# Patient Record
Sex: Female | Born: 1965
Health system: Southern US, Community
[De-identification: ages and names within clinical notes are randomized; demographics above are authoritative.]

## PROBLEM LIST (undated history)

## (undated) DIAGNOSIS — K219 Gastro-esophageal reflux disease without esophagitis: Secondary | ICD-10-CM

## (undated) DIAGNOSIS — W19XXXA Unspecified fall, initial encounter: Secondary | ICD-10-CM

## (undated) DIAGNOSIS — I10 Essential (primary) hypertension: Secondary | ICD-10-CM

## (undated) DIAGNOSIS — Y92009 Unspecified place in unspecified non-institutional (private) residence as the place of occurrence of the external cause: Secondary | ICD-10-CM

---

## 2000-03-03 ENCOUNTER — Encounter: Payer: Self-pay | Admitting: Urology

## 2000-03-03 ENCOUNTER — Encounter: Admission: RE | Admit: 2000-03-03 | Discharge: 2000-03-03 | Payer: Self-pay | Admitting: Urology

## 2001-01-03 ENCOUNTER — Encounter: Payer: Self-pay | Admitting: Family Medicine

## 2001-01-03 ENCOUNTER — Encounter: Admission: RE | Admit: 2001-01-03 | Discharge: 2001-01-03 | Payer: Self-pay | Admitting: Family Medicine

## 2001-12-20 ENCOUNTER — Other Ambulatory Visit: Admission: RE | Admit: 2001-12-20 | Discharge: 2001-12-20 | Payer: Self-pay | Admitting: Family Medicine

## 2003-02-26 ENCOUNTER — Encounter: Payer: Self-pay | Admitting: Family Medicine

## 2003-02-26 ENCOUNTER — Encounter: Admission: RE | Admit: 2003-02-26 | Discharge: 2003-02-26 | Payer: Self-pay | Admitting: Family Medicine

## 2004-03-13 ENCOUNTER — Encounter: Admission: RE | Admit: 2004-03-13 | Discharge: 2004-03-13 | Payer: Self-pay | Admitting: Family Medicine

## 2004-03-20 ENCOUNTER — Other Ambulatory Visit: Admission: RE | Admit: 2004-03-20 | Discharge: 2004-03-30 | Payer: Self-pay | Admitting: Family Medicine

## 2004-09-24 ENCOUNTER — Emergency Department (HOSPITAL_COMMUNITY): Admission: EM | Admit: 2004-09-24 | Discharge: 2004-09-24 | Payer: Self-pay | Admitting: Emergency Medicine

## 2004-09-29 ENCOUNTER — Encounter: Admission: RE | Admit: 2004-09-29 | Discharge: 2004-09-29 | Payer: Self-pay | Admitting: Family Medicine

## 2004-09-30 ENCOUNTER — Encounter: Admission: RE | Admit: 2004-09-30 | Discharge: 2004-09-30 | Payer: Self-pay | Admitting: Family Medicine

## 2004-10-01 ENCOUNTER — Emergency Department (HOSPITAL_COMMUNITY): Admission: EM | Admit: 2004-10-01 | Discharge: 2004-10-01 | Payer: Self-pay

## 2005-09-10 ENCOUNTER — Encounter: Admission: RE | Admit: 2005-09-10 | Discharge: 2005-09-10 | Payer: Self-pay | Admitting: Family Medicine

## 2006-03-09 ENCOUNTER — Encounter: Admission: RE | Admit: 2006-03-09 | Discharge: 2006-03-09 | Payer: Self-pay | Admitting: Family Medicine

## 2006-03-18 ENCOUNTER — Encounter: Admission: RE | Admit: 2006-03-18 | Discharge: 2006-03-18 | Payer: Self-pay | Admitting: General Surgery

## 2006-04-13 ENCOUNTER — Encounter: Admission: RE | Admit: 2006-04-13 | Discharge: 2006-04-13 | Payer: Self-pay | Admitting: Gastroenterology

## 2006-08-24 ENCOUNTER — Other Ambulatory Visit: Admission: RE | Admit: 2006-08-24 | Discharge: 2006-08-24 | Payer: Self-pay | Admitting: Family Medicine

## 2007-09-18 ENCOUNTER — Other Ambulatory Visit: Admission: RE | Admit: 2007-09-18 | Discharge: 2007-09-18 | Payer: Self-pay | Admitting: Family Medicine

## 2008-07-09 ENCOUNTER — Encounter: Admission: RE | Admit: 2008-07-09 | Discharge: 2008-07-09 | Payer: Self-pay | Admitting: Family Medicine

## 2008-07-30 ENCOUNTER — Encounter: Admission: RE | Admit: 2008-07-30 | Discharge: 2008-07-30 | Payer: Self-pay | Admitting: Family Medicine

## 2008-12-13 ENCOUNTER — Other Ambulatory Visit: Admission: RE | Admit: 2008-12-13 | Discharge: 2008-12-13 | Payer: Self-pay | Admitting: Family Medicine

## 2010-02-06 ENCOUNTER — Encounter: Admission: RE | Admit: 2010-02-06 | Discharge: 2010-02-06 | Payer: Self-pay | Admitting: Family Medicine

## 2010-02-06 ENCOUNTER — Other Ambulatory Visit: Admission: RE | Admit: 2010-02-06 | Discharge: 2010-02-06 | Payer: Self-pay | Admitting: Family Medicine

## 2010-02-12 ENCOUNTER — Encounter: Admission: RE | Admit: 2010-02-12 | Discharge: 2010-02-12 | Payer: Self-pay | Admitting: Gastroenterology

## 2010-12-06 ENCOUNTER — Encounter: Payer: Self-pay | Admitting: Gastroenterology

## 2011-02-01 ENCOUNTER — Other Ambulatory Visit: Payer: Self-pay | Admitting: Family Medicine

## 2011-02-01 DIAGNOSIS — Z1231 Encounter for screening mammogram for malignant neoplasm of breast: Secondary | ICD-10-CM

## 2011-02-10 ENCOUNTER — Ambulatory Visit
Admission: RE | Admit: 2011-02-10 | Discharge: 2011-02-10 | Disposition: A | Discharge status: Home / Self Care | Payer: 59 | Source: Ambulatory Visit | Attending: Family Medicine | Admitting: Family Medicine

## 2011-02-10 DIAGNOSIS — Z1231 Encounter for screening mammogram for malignant neoplasm of breast: Secondary | ICD-10-CM

## 2011-06-29 ENCOUNTER — Other Ambulatory Visit: Payer: Self-pay | Admitting: Physician Assistant

## 2011-06-29 ENCOUNTER — Other Ambulatory Visit (HOSPITAL_COMMUNITY)
Admission: RE | Admit: 2011-06-29 | Discharge: 2011-06-29 | Disposition: A | Discharge status: Home / Self Care | Payer: 59 | Source: Ambulatory Visit | Attending: Family Medicine | Admitting: Family Medicine

## 2011-06-29 DIAGNOSIS — Z124 Encounter for screening for malignant neoplasm of cervix: Secondary | ICD-10-CM | POA: Insufficient documentation

## 2012-01-07 ENCOUNTER — Other Ambulatory Visit: Payer: Self-pay | Admitting: Physician Assistant

## 2012-01-07 DIAGNOSIS — Z1231 Encounter for screening mammogram for malignant neoplasm of breast: Secondary | ICD-10-CM

## 2012-02-15 ENCOUNTER — Ambulatory Visit: Payer: 59

## 2012-02-21 ENCOUNTER — Ambulatory Visit
Admission: RE | Admit: 2012-02-21 | Discharge: 2012-02-21 | Disposition: A | Discharge status: Home / Self Care | Payer: 59 | Source: Ambulatory Visit | Attending: Physician Assistant | Admitting: Physician Assistant

## 2012-02-21 DIAGNOSIS — Z1231 Encounter for screening mammogram for malignant neoplasm of breast: Secondary | ICD-10-CM

## 2012-03-03 ENCOUNTER — Emergency Department (HOSPITAL_COMMUNITY): Payer: 59

## 2012-03-03 ENCOUNTER — Emergency Department (HOSPITAL_COMMUNITY)
Admission: EM | Admit: 2012-03-03 | Discharge: 2012-03-03 | Disposition: A | Discharge status: Home / Self Care | Payer: 59 | Attending: Emergency Medicine | Admitting: Emergency Medicine

## 2012-03-03 ENCOUNTER — Encounter (HOSPITAL_COMMUNITY): Payer: Self-pay | Admitting: *Deleted

## 2012-03-03 DIAGNOSIS — I10 Essential (primary) hypertension: Secondary | ICD-10-CM | POA: Insufficient documentation

## 2012-03-03 DIAGNOSIS — R55 Syncope and collapse: Secondary | ICD-10-CM

## 2012-03-03 DIAGNOSIS — R209 Unspecified disturbances of skin sensation: Secondary | ICD-10-CM | POA: Insufficient documentation

## 2012-03-03 DIAGNOSIS — R202 Paresthesia of skin: Secondary | ICD-10-CM

## 2012-03-03 HISTORY — DX: Essential (primary) hypertension: I10

## 2012-03-03 LAB — PREGNANCY, URINE: Preg Test, Ur: NEGATIVE

## 2012-03-03 LAB — BASIC METABOLIC PANEL
BUN: 13 mg/dL (ref 6–23)
CO2: 26 mEq/L (ref 19–32)
Chloride: 102 mEq/L (ref 96–112)
Glucose, Bld: 115 mg/dL — ABNORMAL HIGH (ref 70–99)
Potassium: 3.8 mEq/L (ref 3.5–5.1)

## 2012-03-03 LAB — DIFFERENTIAL
Basophils Absolute: 0 10*3/uL (ref 0.0–0.1)
Basophils Relative: 0 % (ref 0–1)
Eosinophils Absolute: 0.1 10*3/uL (ref 0.0–0.7)
Eosinophils Relative: 1 % (ref 0–5)
Lymphocytes Relative: 31 % (ref 12–46)
Lymphs Abs: 2 10*3/uL (ref 0.7–4.0)
Monocytes Absolute: 0.3 10*3/uL (ref 0.1–1.0)
Monocytes Relative: 4 % (ref 3–12)
Neutro Abs: 4.1 10*3/uL (ref 1.7–7.7)
Neutrophils Relative %: 64 % (ref 43–77)

## 2012-03-03 LAB — URINALYSIS, ROUTINE W REFLEX MICROSCOPIC
Bilirubin Urine: NEGATIVE
Glucose, UA: NEGATIVE mg/dL
Specific Gravity, Urine: 1.01 (ref 1.005–1.030)
pH: 6 (ref 5.0–8.0)

## 2012-03-03 LAB — CBC
HCT: 37.4 % (ref 36.0–46.0)
Hemoglobin: 12.2 g/dL (ref 12.0–15.0)
MCH: 29.3 pg (ref 26.0–34.0)
MCHC: 32.6 g/dL (ref 30.0–36.0)
MCV: 89.7 fL (ref 78.0–100.0)
Platelets: 224 10*3/uL (ref 150–400)
RBC: 4.17 MIL/uL (ref 3.87–5.11)
WBC: 6.4 10*3/uL (ref 4.0–10.5)

## 2012-03-03 LAB — URINE MICROSCOPIC-ADD ON

## 2012-03-03 MED ORDER — FAMOTIDINE IN NACL 20-0.9 MG/50ML-% IV SOLN
20.0000 mg | Freq: Once | INTRAVENOUS | Status: AC
  Administered 2012-03-03: 20 mg via INTRAVENOUS
  Filled 2012-03-03: qty 50

## 2012-03-03 NOTE — ED Notes (Signed)
Pt states she was at work and felt fuzzy headed and tingling/numbness bilaterally.  Pt states left arm woke her up in pain two nights ago with pain and tingling in index finger.  Fingertips are tingling numb.  Pt states she experienced difficulty with getting her words out correctly on yesterday.  This morning she had difficulty putting things in correct place even though she knew where they belonged

## 2012-03-03 NOTE — ED Notes (Signed)
Per EMS pt reports onset of numbness/tingling bilateral hands at 1330. No other neuro deficits. Alert and oriented x 3. Tingling continued, but not as bad on arrival. BP 200/130 HR 76 R 22. CBG 124.

## 2012-03-03 NOTE — Discharge Instructions (Signed)
Near-Syncope Near-syncope is sudden weakness, dizziness, or feeling like you might pass out (faint). This may occur when getting up after sitting or while standing for a long period of time. Near-syncope can be caused by a drop in blood pressure. This is a common reaction, but it may occur to a greater degree in people taking medicines to control their blood pressure. Fainting often occurs when the blood pressure or pulse is too low to provide enough blood flow to the brain to keep you conscious. Fainting and near-syncope are not usually due to serious medical problems. However, certain people should be more cautious in the event of near-syncope, including elderly patients, patients with diabetes, and patients with a history of heart conditions (especially irregular rhythms).  CAUSES   Drop in blood pressure.   Physical pain.   Dehydration.   Heat exhaustion.   Emotional distress.   Low blood sugar.   Internal bleeding.   Heart and circulatory problems.   Infections.  SYMPTOMS   Dizziness.   Feeling sick to your stomach (nauseous).   Nearly fainting.   Body numbness.   Turning pale.   Tunnel vision.   Weakness.  HOME CARE INSTRUCTIONS   Lie down right away if you start feeling like you might faint. Breathe deeply and steadily. Wait until all the symptoms have passed. Most of these episodes last only a few minutes. You may feel tired for several hours.   Drink enough fluids to keep your urine clear or pale yellow.   If you are taking blood pressure or heart medicine, get up slowly, taking several minutes to sit and then stand. This can reduce dizziness that is caused by a drop in blood pressure.  SEEK IMMEDIATE MEDICAL CARE IF:   You have a severe headache.   Unusual pain develops in the chest, abdomen, or back.   There is bleeding from the mouth or rectum, or you have black or tarry stool.   An irregular heartbeat or a very rapid pulse develops.   You have  repeated fainting or seizure-like jerking during an episode.   You faint when sitting or lying down.   You develop confusion.   You have difficulty walking.   Severe weakness develops.   Vision problems develop.  MAKE SURE YOU:   Understand these instructions.   Will watch your condition.   Will get help right away if you are not doing well or get worse.  Document Released: 11/01/2005 Document Revised: 10/21/2011 Document Reviewed: 12/18/2010 [employer] Patient Information 2012 [employer]. 

## 2012-03-03 NOTE — ED Provider Notes (Signed)
History     CSN: [id number]  Arrival date & time 03/03/12  1422   First MD Initiated Contact with Patient 03/03/12 1503      Chief Complaint  Patient presents with  . Numbness  . Tingling    (Consider location/radiation/quality/duration/timing/severity/associated sxs/prior treatment) HPI Comments: Patient presents stating that she just doesn't feel well today.  She has noted some bilateral hand tingling.  No focal weakness, speech changes or facial droop.  Patient notes intermittent headache to the front of her head.  It is not sudden in onset.  She has no fevers.  She did have some mild nausea and lightheadedness earlier.  She describes the lightheadedness as "swimmy headiness".  She did not pass out.  It is not a vertiginous sensation.  No vomiting or diarrhea.  No urinary symptoms.  No prior TIAs or strokes.  Patient states that she just feels like her whole body is heavy.  Patient was at work when this occurred and notes the symptoms seem to be worse upon standing.  EMS was called and she was found to have a high blood pressure with them and was brought here to the emergency department for further evaluation.  Patient is a 46 y.o. female presenting with neurologic complaint. The history is provided by the patient. No language interpreter was used.  Neurologic Problem The primary symptoms include headaches and nausea. Primary symptoms do not include fever or vomiting.    Past Medical History  Diagnosis Date  . Hypertension     History reviewed. No pertinent past surgical history.  History reviewed. No pertinent family history.  History  Substance Use Topics  . Smoking status: Not on file  . Smokeless tobacco: Not on file  . Alcohol Use:     OB History    Grav Para Term Preterm Abortions TAB SAB Ect Mult Living                  Review of Systems  Constitutional: Negative.  Negative for fever and chills.  Eyes: Negative.  Negative for discharge and redness.    Respiratory: Negative.  Negative for cough and shortness of breath.   Cardiovascular: Negative.  Negative for chest pain.  Gastrointestinal: Positive for nausea. Negative for vomiting, abdominal pain and diarrhea.  Genitourinary: Negative.  Negative for dysuria and vaginal discharge.  Musculoskeletal: Negative.  Negative for back pain.  Skin: Negative.  Negative for color change and rash.  Neurological: Positive for light-headedness and headaches. Negative for syncope.  Hematological: Negative.  Negative for adenopathy.  Psychiatric/Behavioral: Negative.  Negative for confusion.  All other systems reviewed and are negative.    Allergies  Review of patient's allergies indicates no known allergies.  Home Medications   Current Outpatient Rx  Name Route Sig Dispense Refill  . HYDROCHLOROTHIAZIDE 25 MG PO TABS Oral Take 25 mg by mouth daily.    Marland Kitchen LISINOPRIL 20 MG PO TABS Oral Take 20 mg by mouth daily.    . NORETHINDRONE ACET-ETHINYL EST 1-20 MG-MCG PO TABS Oral Take 1 tablet by mouth daily.    Marland Kitchen OMEPRAZOLE 20 MG PO CPDR Oral Take 20 mg by mouth daily.      BP 114/59  Pulse 71  SpO2 95%  LMP 02/02/2012  Physical Exam  Nursing note and vitals reviewed. Constitutional: She is oriented to person, place, and time. She appears well-developed and well-nourished.  Non-toxic appearance. She does not have a sickly appearance.  HENT:  Head: Normocephalic and atraumatic.  Eyes:  Conjunctivae, EOM and lids are normal. Pupils are equal, round, and reactive to light. No scleral icterus.  Neck: Trachea normal and normal range of motion. Neck supple.  Cardiovascular: Normal rate, regular rhythm and normal heart sounds.  Exam reveals no gallop and no friction rub.   No murmur heard. Pulmonary/Chest: Effort normal and breath sounds normal. No respiratory distress. She has no wheezes. She has no rales.  Abdominal: Soft. Normal appearance. There is no tenderness. There is no rebound, no guarding and  no CVA tenderness.  Musculoskeletal: Normal range of motion.  Neurological: She is alert and oriented to person, place, and time. She has normal strength.       Cranial nerves II through XII are intact.  Face is symmetric.  Tongue is midline.  Extract her eye movements are intact.  Visual fields are intact.  No pronator drift.  Finger to nose and heel to shin testing bilaterally.  Symmetric strength in her upper extremities and lower extremities.  Sensation to light touch is intact in both hands and her feet.  Normal speech.  Skin: Skin is warm, dry and intact. No rash noted.  Psychiatric: She has a normal mood and affect. Her behavior is normal. Judgment and thought content normal.    ED Course  Procedures (including critical care time)  Results for orders placed during the hospital encounter of 03/03/12  CBC      Component Value Range   WBC 6.4  4.0 - 10.5 (K/uL)   RBC 4.17  3.87 - 5.11 (MIL/uL)   Hemoglobin 12.2  12.0 - 15.0 (g/dL)   HCT 16.1  09.6 - 04.5 (%)   MCV 89.7  78.0 - 100.0 (fL)   MCH 29.3  26.0 - 34.0 (pg)   MCHC 32.6  30.0 - 36.0 (g/dL)   RDW 40.9  81.1 - 91.4 (%)   Platelets 224  150 - 400 (K/uL)  DIFFERENTIAL      Component Value Range   Neutrophils Relative 64  43 - 77 (%)   Neutro Abs 4.1  1.7 - 7.7 (K/uL)   Lymphocytes Relative 31  12 - 46 (%)   Lymphs Abs 2.0  0.7 - 4.0 (K/uL)   Monocytes Relative 4  3 - 12 (%)   Monocytes Absolute 0.3  0.1 - 1.0 (K/uL)   Eosinophils Relative 1  0 - 5 (%)   Eosinophils Absolute 0.1  0.0 - 0.7 (K/uL)   Basophils Relative 0  0 - 1 (%)   Basophils Absolute 0.0  0.0 - 0.1 (K/uL)  BASIC METABOLIC PANEL      Component Value Range   Sodium 138  135 - 145 (mEq/L)   Potassium 3.8  3.5 - 5.1 (mEq/L)   Chloride 102  96 - 112 (mEq/L)   CO2 26  19 - 32 (mEq/L)   Glucose, Bld 115 (*) 70 - 99 (mg/dL)   BUN 13  6 - 23 (mg/dL)   Creatinine, Ser 7.82  0.50 - 1.10 (mg/dL)   Calcium 9.1  8.4 - 95.6 (mg/dL)   GFR calc non Af Amer >90  >90  (mL/min)   GFR calc Af Amer >90  >90 (mL/min)  URINALYSIS, ROUTINE W REFLEX MICROSCOPIC      Component Value Range   Color, Urine YELLOW  YELLOW    APPearance CLEAR  CLEAR    Specific Gravity, Urine 1.010  1.005 - 1.030    pH 6.0  5.0 - 8.0    Glucose, UA NEGATIVE  NEGATIVE (mg/dL)   Hgb urine dipstick MODERATE (*) NEGATIVE    Bilirubin Urine NEGATIVE  NEGATIVE    Ketones, ur NEGATIVE  NEGATIVE (mg/dL)   Protein, ur NEGATIVE  NEGATIVE (mg/dL)   Urobilinogen, UA 0.2  0.0 - 1.0 (mg/dL)   Nitrite NEGATIVE  NEGATIVE    Leukocytes, UA NEGATIVE  NEGATIVE   PREGNANCY, URINE      Component Value Range   Preg Test, Ur NEGATIVE  NEGATIVE   URINE MICROSCOPIC-ADD ON      Component Value Range   Squamous Epithelial / LPF RARE  RARE    RBC / HPF 3-6  <3 (RBC/hpf)   Dg Chest 2 View  03/03/2012  *RADIOLOGY REPORT*  Clinical Data: Chest pressure.  Dizzy and weakness.  CHEST - 2 VIEW  Comparison: Two-view chest 07/09/2008.  Findings: The heart size is normal.  The lungs are clear.  The visualized soft tissues and bony thorax are unremarkable.  IMPRESSION: Negative chest.  Original Report Authenticated By: Jamesetta Orleans. MATTERN, M.D.   Ct Head Wo Contrast  03/03/2012  *RADIOLOGY REPORT*  Clinical Data: 46 year old female with altered speech, numbness and tingling.  CT HEAD WITHOUT CONTRAST  Technique:  Contiguous axial images were obtained from the base of the skull through the vertex without contrast.  Comparison: None.  Findings: Visualized paranasal sinuses and mastoids are clear.  No acute osseous abnormality identified.  Visualized orbits and scalp soft tissues are within normal limits.  Cerebral volume is within normal limits for age.  No midline shift, ventriculomegaly, mass effect, evidence of mass lesion, intracranial hemorrhage or evidence of cortically based acute infarction.  Gray-white matter differentiation is within normal limits throughout the brain.  No suspicious intracranial vascular  hyperdensity.  IMPRESSION: Normal noncontrast CT appearance of the brain.  Original Report Authenticated By: Harley Hallmark, M.D.    Date: 03/03/2012  Rate: 73  Rhythm: normal sinus rhythm  QRS Axis: normal  Intervals: normal  ST/T Wave abnormalities: normal  Conduction Disutrbances:none  Narrative Interpretation:   Old EKG Reviewed: unchanged from 10/01/2004   MDM  Patient with symptoms that are not consistent with CVA or TIA as her tingling is nonspecific and bilateral.  The symptoms are not consistent with radiculopathy either at this point in time the patient has no pain.  Patient has no focal neurologic deficits on exam at this time.  Her vital signs are normal here with repeated normal blood pressures despite EMSs elevated blood pressure.  Patient has no EKG changes, anemia or glucose abnormalities to suggest new onset diabetes.  Patient's symptoms may have been a vasovagal episode without syncope but it is unclear.  Nevertheless this patient is a healthy with only a history of high blood pressure I feel she is safe for discharge home.  She feels better at this time.  She will followup with her primary care physician as an outpatient.        Nat Christen, MD 03/03/12 [id number]

## 2012-03-03 NOTE — ED Notes (Signed)
Pt states she is experiencing the tingling and numbness in fingertips again

## 2012-08-22 ENCOUNTER — Other Ambulatory Visit: Payer: Self-pay | Admitting: Physician Assistant

## 2012-08-22 ENCOUNTER — Ambulatory Visit
Admission: RE | Admit: 2012-08-22 | Discharge: 2012-08-22 | Disposition: A | Discharge status: Home / Self Care | Payer: 59 | Source: Ambulatory Visit | Attending: Physician Assistant | Admitting: Physician Assistant

## 2012-08-22 DIAGNOSIS — R52 Pain, unspecified: Secondary | ICD-10-CM

## 2012-08-22 DIAGNOSIS — N200 Calculus of kidney: Secondary | ICD-10-CM

## 2012-10-03 ENCOUNTER — Ambulatory Visit
Admission: RE | Admit: 2012-10-03 | Discharge: 2012-10-03 | Disposition: A | Discharge status: Home / Self Care | Payer: Worker's Compensation | Source: Ambulatory Visit | Attending: Family Medicine | Admitting: Family Medicine

## 2012-10-03 ENCOUNTER — Other Ambulatory Visit: Payer: Self-pay | Admitting: Family Medicine

## 2012-10-03 DIAGNOSIS — W19XXXA Unspecified fall, initial encounter: Secondary | ICD-10-CM

## 2012-11-20 ENCOUNTER — Other Ambulatory Visit: Payer: Self-pay | Admitting: Physician Assistant

## 2012-11-20 ENCOUNTER — Ambulatory Visit
Admission: RE | Admit: 2012-11-20 | Discharge: 2012-11-20 | Disposition: A | Discharge status: Home / Self Care | Payer: 59 | Source: Ambulatory Visit | Attending: Physician Assistant | Admitting: Physician Assistant

## 2012-11-20 DIAGNOSIS — R509 Fever, unspecified: Secondary | ICD-10-CM

## 2012-11-20 DIAGNOSIS — R059 Cough, unspecified: Secondary | ICD-10-CM

## 2012-11-20 DIAGNOSIS — R05 Cough: Secondary | ICD-10-CM

## 2013-01-02 ENCOUNTER — Other Ambulatory Visit (HOSPITAL_COMMUNITY)
Admission: RE | Admit: 2013-01-02 | Discharge: 2013-01-02 | Disposition: A | Discharge status: Home / Self Care | Payer: 59 | Source: Ambulatory Visit | Attending: Family Medicine | Admitting: Family Medicine

## 2013-01-02 ENCOUNTER — Other Ambulatory Visit: Payer: Self-pay | Admitting: Physician Assistant

## 2013-01-02 DIAGNOSIS — Z124 Encounter for screening for malignant neoplasm of cervix: Secondary | ICD-10-CM | POA: Insufficient documentation

## 2013-03-21 ENCOUNTER — Other Ambulatory Visit: Payer: Self-pay

## 2013-03-21 DIAGNOSIS — Z1231 Encounter for screening mammogram for malignant neoplasm of breast: Secondary | ICD-10-CM

## 2013-04-25 ENCOUNTER — Ambulatory Visit
Admission: RE | Admit: 2013-04-25 | Discharge: 2013-04-25 | Disposition: A | Discharge status: Home / Self Care | Payer: 59 | Source: Ambulatory Visit

## 2013-04-25 DIAGNOSIS — Z1231 Encounter for screening mammogram for malignant neoplasm of breast: Secondary | ICD-10-CM

## 2013-04-26 ENCOUNTER — Other Ambulatory Visit: Payer: Self-pay | Admitting: Physician Assistant

## 2013-04-26 DIAGNOSIS — R928 Other abnormal and inconclusive findings on diagnostic imaging of breast: Secondary | ICD-10-CM

## 2013-05-09 ENCOUNTER — Ambulatory Visit
Admission: RE | Admit: 2013-05-09 | Discharge: 2013-05-09 | Disposition: A | Discharge status: Home / Self Care | Payer: 59 | Source: Ambulatory Visit | Attending: Physician Assistant | Admitting: Physician Assistant

## 2013-05-09 DIAGNOSIS — R928 Other abnormal and inconclusive findings on diagnostic imaging of breast: Secondary | ICD-10-CM

## 2014-06-13 ENCOUNTER — Other Ambulatory Visit: Payer: Self-pay

## 2014-06-13 DIAGNOSIS — Z1231 Encounter for screening mammogram for malignant neoplasm of breast: Secondary | ICD-10-CM

## 2014-06-17 ENCOUNTER — Ambulatory Visit
Admission: RE | Admit: 2014-06-17 | Discharge: 2014-06-17 | Disposition: A | Discharge status: Home / Self Care | Payer: 59 | Source: Ambulatory Visit

## 2014-06-17 DIAGNOSIS — Z1231 Encounter for screening mammogram for malignant neoplasm of breast: Secondary | ICD-10-CM

## 2014-07-07 ENCOUNTER — Encounter (HOSPITAL_COMMUNITY): Payer: Self-pay | Admitting: Emergency Medicine

## 2014-07-07 ENCOUNTER — Emergency Department (HOSPITAL_COMMUNITY): Payer: 59

## 2014-07-07 ENCOUNTER — Observation Stay (HOSPITAL_COMMUNITY): Payer: 59

## 2014-07-07 ENCOUNTER — Observation Stay (HOSPITAL_COMMUNITY)
Admission: EM | Admit: 2014-07-07 | Discharge: 2014-07-10 | Disposition: A | Discharge status: Home / Self Care | Payer: 59 | Attending: Internal Medicine | Admitting: Internal Medicine

## 2014-07-07 DIAGNOSIS — K219 Gastro-esophageal reflux disease without esophagitis: Secondary | ICD-10-CM | POA: Diagnosis not present

## 2014-07-07 DIAGNOSIS — K810 Acute cholecystitis: Secondary | ICD-10-CM | POA: Diagnosis present

## 2014-07-07 DIAGNOSIS — R1011 Right upper quadrant pain: Secondary | ICD-10-CM

## 2014-07-07 DIAGNOSIS — I10 Essential (primary) hypertension: Secondary | ICD-10-CM

## 2014-07-07 DIAGNOSIS — K8000 Calculus of gallbladder with acute cholecystitis without obstruction: Secondary | ICD-10-CM | POA: Diagnosis not present

## 2014-07-07 DIAGNOSIS — R079 Chest pain, unspecified: Secondary | ICD-10-CM

## 2014-07-07 DIAGNOSIS — K8020 Calculus of gallbladder without cholecystitis without obstruction: Secondary | ICD-10-CM

## 2014-07-07 HISTORY — DX: Gastro-esophageal reflux disease without esophagitis: K21.9

## 2014-07-07 LAB — BASIC METABOLIC PANEL
ANION GAP: 15 (ref 5–15)
BUN: 13 mg/dL (ref 6–23)
CALCIUM: 9.2 mg/dL (ref 8.4–10.5)
CO2: 21 mEq/L (ref 19–32)
Chloride: 103 mEq/L (ref 96–112)
Creatinine, Ser: 0.75 mg/dL (ref 0.50–1.10)
Glucose, Bld: 88 mg/dL (ref 70–99)
POTASSIUM: 4 meq/L (ref 3.7–5.3)
SODIUM: 139 meq/L (ref 137–147)

## 2014-07-07 LAB — RAPID URINE DRUG SCREEN, HOSP PERFORMED
Amphetamines: NOT DETECTED
Barbiturates: NOT DETECTED
Benzodiazepines: NOT DETECTED
Cocaine: NOT DETECTED
OPIATES: POSITIVE — AB
Tetrahydrocannabinol: NOT DETECTED

## 2014-07-07 LAB — I-STAT TROPONIN, ED: Troponin i, poc: 0 ng/mL (ref 0.00–0.08)

## 2014-07-07 LAB — CBC
HCT: 38 % (ref 36.0–46.0)
Hemoglobin: 12.6 g/dL (ref 12.0–15.0)
MCH: 29.2 pg (ref 26.0–34.0)
MCHC: 33.2 g/dL (ref 30.0–36.0)
MCV: 88 fL (ref 78.0–100.0)
PLATELETS: 249 10*3/uL (ref 150–400)
RBC: 4.32 MIL/uL (ref 3.87–5.11)
RDW: 13.2 % (ref 11.5–15.5)
WBC: 7.1 10*3/uL (ref 4.0–10.5)

## 2014-07-07 LAB — TROPONIN I

## 2014-07-07 MED ORDER — ONDANSETRON HCL 4 MG/2ML IJ SOLN: 4.0000 mg | Freq: Four times a day (QID) | INTRAMUSCULAR | Status: DC | PRN

## 2014-07-07 MED ORDER — HYDRALAZINE HCL 20 MG/ML IJ SOLN: 10.0000 mg | Freq: Four times a day (QID) | INTRAMUSCULAR | Status: DC | PRN

## 2014-07-07 MED ORDER — ONDANSETRON HCL 4 MG PO TABS: 4.0000 mg | ORAL_TABLET | Freq: Four times a day (QID) | ORAL | Status: DC | PRN

## 2014-07-07 MED ORDER — IOHEXOL 350 MG/ML SOLN
80.0000 mL | Freq: Once | INTRAVENOUS | Status: AC | PRN
  Administered 2014-07-07: 80 mL via INTRAVENOUS

## 2014-07-07 MED ORDER — NITROGLYCERIN 0.4 MG SL SUBL: 0.4000 mg | SUBLINGUAL_TABLET | SUBLINGUAL | Status: DC | PRN

## 2014-07-07 MED ORDER — BLOOD PRESSURE CONTROL BOOK
Freq: Once | Status: AC
  Administered 2014-07-07: 22:00:00
  Filled 2014-07-07: qty 1

## 2014-07-07 MED ORDER — ASPIRIN 325 MG PO TABS
325.0000 mg | ORAL_TABLET | Freq: Every day | ORAL | Status: DC
  Administered 2014-07-08: 325 mg via ORAL
  Filled 2014-07-07 (×3): qty 1

## 2014-07-07 MED ORDER — PANTOPRAZOLE SODIUM 40 MG PO TBEC
40.0000 mg | DELAYED_RELEASE_TABLET | Freq: Every day | ORAL | Status: DC
  Administered 2014-07-07 – 2014-07-10 (×3): 40 mg via ORAL
  Filled 2014-07-07 (×3): qty 1

## 2014-07-07 MED ORDER — SODIUM CHLORIDE 0.9 % IJ SOLN
3.0000 mL | Freq: Two times a day (BID) | INTRAMUSCULAR | Status: DC
  Administered 2014-07-08 – 2014-07-09 (×3): 3 mL via INTRAVENOUS

## 2014-07-07 MED ORDER — ONDANSETRON HCL 4 MG/2ML IJ SOLN
4.0000 mg | Freq: Once | INTRAMUSCULAR | Status: AC
  Administered 2014-07-07: 4 mg via INTRAVENOUS
  Filled 2014-07-07: qty 2

## 2014-07-07 MED ORDER — MORPHINE SULFATE 4 MG/ML IJ SOLN
4.0000 mg | Freq: Once | INTRAMUSCULAR | Status: AC
  Administered 2014-07-07: 4 mg via INTRAVENOUS
  Filled 2014-07-07: qty 1

## 2014-07-07 MED ORDER — ACETAMINOPHEN 325 MG PO TABS
650.0000 mg | ORAL_TABLET | Freq: Four times a day (QID) | ORAL | Status: DC | PRN
Start: 2014-07-07 — End: 2014-07-10
  Administered 2014-07-07 – 2014-07-08 (×2): 650 mg via ORAL
  Filled 2014-07-07 (×2): qty 2

## 2014-07-07 MED ORDER — ASPIRIN 325 MG PO TABS
325.0000 mg | ORAL_TABLET | ORAL | Status: AC
  Filled 2014-07-07: qty 1

## 2014-07-07 MED ORDER — ENOXAPARIN SODIUM 40 MG/0.4ML ~~LOC~~ SOLN
40.0000 mg | SUBCUTANEOUS | Status: DC
  Administered 2014-07-07 – 2014-07-08 (×2): 40 mg via SUBCUTANEOUS
  Filled 2014-07-07 (×4): qty 0.4

## 2014-07-07 MED ORDER — MORPHINE SULFATE 2 MG/ML IJ SOLN
2.0000 mg | INTRAMUSCULAR | Status: DC | PRN
  Administered 2014-07-07: 2 mg via INTRAVENOUS
  Filled 2014-07-07: qty 1

## 2014-07-07 MED ORDER — ACTIVE PARTNERSHIP FOR HEALTH OF YOUR HEART BOOK
Freq: Once | Status: AC
  Administered 2014-07-07: 22:00:00
  Filled 2014-07-07: qty 1

## 2014-07-07 MED ORDER — ACETAMINOPHEN 650 MG RE SUPP: 650.0000 mg | Freq: Four times a day (QID) | RECTAL | Status: DC | PRN

## 2014-07-07 NOTE — ED Provider Notes (Signed)
CSN: [id number]     Arrival date & time 07/07/14  1125 History   First MD Initiated Contact with Patient 07/07/14 1125     Chief Complaint  Patient presents with  . Chest Pain     (Consider location/radiation/quality/duration/timing/severity/associated sxs/prior Treatment) HPI Pt presenting with c/o sudden onset of chest pain while at church.  She states pain began in substernal central chest and then radiated into left jaw, neck and down left arm.  Pt denies nausea or diaphoresis with pain.  Pain was decreased from 8/10 to 4/10 with nitroglycerin.  Pt given 2 aspirins at church.  No leg swelling, no hx of DVT/PE, no recent travel/trauma/surgery.  Pt states she had another episode of similar pain which was years ago- saw a cardiologist dr. Anne Fu and had normal workup.  Pain is currently 4/10.  Symptoms were not brought on by exertion.  There are no other associated systemic symptoms, there are no other alleviating or modifying factors.   Past Medical History  Diagnosis Date  . Hypertension   . GERD (gastroesophageal reflux disease)    Past Surgical History  Procedure Laterality Date  . Cesarean section     History reviewed. No pertinent family history. History  Substance Use Topics  . Smoking status: Never Smoker   . Smokeless tobacco: Never Used  . Alcohol Use: No   OB History   Grav Para Term Preterm Abortions TAB SAB Ect Mult Living                 Review of Systems ROS reviewed and all otherwise negative except for mentioned in HPI    Allergies  Review of patient's allergies indicates no known allergies.  Home Medications   Prior to Admission medications   Medication Sig Start Date End Date Taking? Authorizing Provider  furosemide (LASIX) 20 MG tablet Take 20 mg by mouth daily. 06/10/14  Yes Historical Provider, MD  hydrochlorothiazide (HYDRODIURIL) 25 MG tablet Take 25 mg by mouth daily.   Yes Historical Provider, MD  lisinopril (PRINIVIL,ZESTRIL) 20 MG tablet  Take 20 mg by mouth daily.   Yes Historical Provider, MD  norethindrone-ethinyl estradiol (JUNEL 1/20) 1-20 MG-MCG tablet Take 1 tablet by mouth daily.   Yes Historical Provider, MD  omeprazole (PRILOSEC) 20 MG capsule Take 20 mg by mouth daily.   Yes Historical Provider, MD   BP 147/60  Pulse 78  Temp(Src) 98.5 F (36.9 C) (Oral)  Resp 18  Ht  (1.651 m)  Wt 191 lb (86.637 kg)  BMI 31.78 kg/m2  SpO2 100%  LMP 06/16/2014 Vitals reviewed Physical Exam Physical Examination: General appearance - alert, well appearing, and in no distress Mental status - alert, oriented to person, place, and time Eyes - no conjunctival injection, no scleral icterus Mouth - mucous membranes moist, pharynx normal without lesions Chest - clear to auscultation, no wheezes, rales or rhonchi, symmetric air entry Heart - normal rate, regular rhythm, normal S1, S2, no murmurs, rubs, clicks or gallops Abdomen - soft, nontender, nondistended, no masses or organomegaly Extremities - peripheral pulses normal, no pedal edema, no clubbing or cyanosis Skin - normal coloration and turgor, no rashes  ED Course  Procedures (including critical care time) Labs Review Labs Reviewed  URINE RAPID DRUG SCREEN (HOSP PERFORMED) - Abnormal; Notable for the following:    Opiates POSITIVE (*)    All other components within normal limits  CBC  BASIC METABOLIC PANEL  TROPONIN I  TROPONIN I  TROPONIN I  HEPATIC FUNCTION PANEL  Rosezena Sensor, ED    Imaging Review Dg Chest Port 1 View  07/07/2014   CLINICAL DATA:  Chest pain.  EXAM: PORTABLE CHEST - 1 VIEW  COMPARISON:  November 20, 2012.  FINDINGS: The heart size and mediastinal contours are within normal limits. Both lungs are clear. No pneumothorax or pleural effusion is noted. The visualized skeletal structures are unremarkable.  IMPRESSION: No acute cardiopulmonary abnormality seen.   Electronically Signed   By: Roque Lias M.D.   On: 07/07/2014 12:14   Ct Angio  Chest Aortic Dissect W &/or W/o  07/07/2014   CLINICAL DATA:  Chest pain that radiates into the back and left shoulder. Headache. Question dissection.  EXAM: CT ANGIOGRAPHY CHEST WITH CONTRAST  TECHNIQUE: Multidetector CT imaging of the chest was performed using the standard protocol during bolus administration of intravenous contrast. Multiplanar CT image reconstructions and MIPs were obtained to evaluate the vascular anatomy.  CONTRAST:  80mL OMNIPAQUE IOHEXOL 350 MG/ML SOLN  COMPARISON:  Radiographs 07/07/2014.  CT 10/01/2004.  FINDINGS: Vascular: Pre contrast images demonstrate no displaced intimal calcifications or mediastinal hematoma. Post-contrast, the thoracic aorta enhances normally. There is no evidence of aneurysm or dissection. The pulmonary arteries are satisfactorily opacified with contrast. There is no evidence of acute pulmonary embolism. No significant atherosclerosis is seen.  Mediastinum: There are no enlarged mediastinal, hilar or axillary lymph nodes. The thyroid gland, trachea and esophagus appear normal. The heart size is normal.  Lungs/Pleura: There is no pleural or pericardial effusion.The lungs are clear aside from minimal dependent atelectasis bilaterally.  Upper abdomen: Unremarkable.  There is no adrenal mass.  Musculoskeletal/Chest wall: No chest wall lesion or acute osseous findings.  Review of the MIP images confirms the above findings.  IMPRESSION: Negative chest CTA. No evidence of aortic dissection, pulmonary embolism or other acute chest process.   Electronically Signed   By: Roxy Horseman M.D.   On: 07/07/2014 16:49     EKG Interpretation   Date/Time:  Sunday July 07 2014 11:29:49 EDT Ventricular Rate:  64 PR Interval:  149 QRS Duration: 88 QT Interval:  412 QTC Calculation: 425 R Axis:   0 Text Interpretation:  Sinus rhythm Normal ECG No significant change since  last tracing Confirmed by [facility]  MD, Burnis Kaser [phone]) on 07/07/2014 4:01:04  PM      MDM    Final diagnoses:  Chest pain, unspecified chest pain type  Essential hypertension  Gastroesophageal reflux disease without esophagitis    Pt presenting with c/o chest pain- radiation to jaw, neck and arm.  EKG reassuring and normal troponin.  However due to concerning story and HTN she is heart score of 4- pt admitted for further evaluation.  No risk factors for PE, PERC 0, no further chest pain in the ED after nitro- morphine did not help with neck and arm pain.  Pt prefers not to have further nitroglycerin.      Ethelda Chick, MD 07/08/14 [phone]

## 2014-07-07 NOTE — ED Notes (Signed)
Pt is CP free. Pt c/o pain to left shoulder/back.

## 2014-07-07 NOTE — H&P (Signed)
[facility] History and Physical  Heather Hamilton:[id number] DOB: Apr 13, 1966 DOA: 07/07/2014  Referring physician:  PCP: REDMON,NOELLE, PA-C  Specialists:   Chief Complaint: chest pain   HPI: Heather Hamilton is a 48 y.o. female with PMH of HTN, presented with sudden oncet substernal 10/10 chest pain radiating to back, to both arms associated with tingling, numbness, palpitations,  SOB, some diaphoresis; Patient is very active at baseline, denies any exertional symptoms; denies fever, cough, no nausea, vomiting, diarrhea, no abdominal pain   Review of Systems: The patient denies anorexia, fever, weight loss,, vision loss, decreased hearing, hoarseness, syncope, dyspnea on exertion, peripheral edema, balance deficits, hemoptysis, abdominal pain, melena, hematochezia, severe indigestion/heartburn, hematuria, incontinence, genital sores, muscle weakness, suspicious skin lesions, transient blindness, difficulty walking, depression, unusual weight change, abnormal bleeding, enlarged lymph nodes, angioedema, and breast masses.    Past Medical History  Diagnosis Date  . Hypertension   . GERD (gastroesophageal reflux disease)    Past Surgical History  Procedure Laterality Date  . Cesarean section     Social History:  reports that she has never smoked. She has never used smokeless tobacco. She reports that she does not drink alcohol or use illicit drugs. Home;  where does patient live--home, ALF, SNF? and with whom if at home? yes Can patient participate in ADLs?  No Known Allergies  History reviewed. No pertinent family history.  (be sure to complete)  Prior to Admission medications   Medication Sig Start Date End Date Taking? Authorizing Provider  furosemide (LASIX) 20 MG tablet Take 20 mg by mouth daily. 06/10/14  Yes Historical Provider, MD  hydrochlorothiazide (HYDRODIURIL) 25 MG tablet Take 25 mg by mouth daily.   Yes Historical Provider, MD  lisinopril (PRINIVIL,ZESTRIL) 20 MG  tablet Take 20 mg by mouth daily.   Yes Historical Provider, MD  norethindrone-ethinyl estradiol (JUNEL 1/20) 1-20 MG-MCG tablet Take 1 tablet by mouth daily.   Yes Historical Provider, MD  omeprazole (PRILOSEC) 20 MG capsule Take 20 mg by mouth daily.   Yes Historical Provider, MD   Physical Exam: Filed Vitals:   07/07/14 1500  BP: 134/68  Pulse: 64  Temp:   Resp: 16     General:  alert  Eyes: eom-i  ENT: no oral ulcer s  Neck: supple, no JVD  Cardiovascular: s1,s2 rrr  Respiratory: CTA BL  Abdomen: soft, nt,nd   Skin: no rash   Musculoskeletal: no LE edema  Psychiatric: no hallucinations   Neurologic: CN 2-12 intact   Labs on Admission:  Basic Metabolic Panel:  Recent Labs Lab 07/07/14 1154  NA 139  K 4.0  CL 103  CO2 21  GLUCOSE 88  BUN 13  CREATININE 0.75  CALCIUM 9.2   Liver Function Tests: No results found for this basename: AST, ALT, ALKPHOS, BILITOT, PROT, ALBUMIN,  in the last 168 hours No results found for this basename: LIPASE, AMYLASE,  in the last 168 hours No results found for this basename: AMMONIA,  in the last 168 hours CBC:  Recent Labs Lab 07/07/14 1154  WBC 7.1  HGB 12.6  HCT 38.0  MCV 88.0  PLT 249   Cardiac Enzymes: No results found for this basename: CKTOTAL, CKMB, CKMBINDEX, TROPONINI,  in the last 168 hours  BNP (last 3 results) No results found for this basename: PROBNP,  in the last 8760 hours CBG: No results found for this basename: GLUCAP,  in the last 168 hours  Radiological Exams on Admission: Dg Chest  Port 1 View  07/07/2014   CLINICAL DATA:  Chest pain.  EXAM: PORTABLE CHEST - 1 VIEW  COMPARISON:  November 20, 2012.  FINDINGS: The heart size and mediastinal contours are within normal limits. Both lungs are clear. No pneumothorax or pleural effusion is noted. The visualized skeletal structures are unremarkable.  IMPRESSION: No acute cardiopulmonary abnormality seen.   Electronically Signed   By: Roque Lias  M.D.   On: 07/07/2014 12:14    EKG: Independently reviewed.   Assessment/Plan Principal Problem:   Chest pain   48 y.o. female with PMH of HTN, presented with sudden oncet substernal 10/10 chest pain radiating to back, to both arms associated with tingling, numbness, palpitations,  SOB, some diaphoresis  1. Chest pain, atypical; initila ECG, trop are unremarkable; pain decreased but still reports some mild chest pain radiating to her arms -ASA, NTG, obtain CTA chest; serial trop, ECG; echo; urine tox;  called cardiology consult for stress test eval   2. HTN, stable; hold ACE, diuretics with IV contrast exposure; prn hydralazine   3. GERD, cont PPI    Cardiology;  if consultant consulted, please document name and whether formally or informally consulted  Code Status: full (must indicate code status--if unknown or must be presumed, indicate so) Family Communication:  D/w patient; her sister  (indicate person spoken with, if applicable, with phone number if by telephone) Disposition Plan: home pend clinical improvement  (indicate anticipated LOS)  Time spent: >35 minutes   Esperanza Sheets [facility] Pager [phone]  If 7PM-7AM, please contact night-coverage [email or web address] Password [facility] 07/07/2014, 3:34 PM

## 2014-07-07 NOTE — Consult Note (Signed)
CARDIOLOGY CONSULT NOTE   Patient ID: Heather Hamilton MRN: [id number] DOB/AGE: Oct 06, 1966 48 y.o.  Admit date: 07/07/2014  Primary Physician   REDMON,NOELLE, PA-C Primary Cardiologist  Anne Fu Reason for Consultation   Chest pain   HPI: Heather Hamilton is a 48 y.o. female with a history of HTN and GERD who presented to the [facility] ED with chest pain.    She has been seen by Dr. Anne Fu in past and had a normal stress test.   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 ECG and troponin normal.   CT chest no PE    Past Medical History  Diagnosis Date  . Hypertension   . GERD (gastroesophageal reflux disease)      Past Surgical History  Procedure Laterality Date  . Cesarean section      No Known Allergies  I have reviewed the patient's current medications . aspirin  325 mg Oral STAT     nitroGLYCERIN, nitroGLYCERIN  Prior to Admission medications   Medication Sig Start Date End Date Taking? Authorizing Provider  furosemide (LASIX) 20 MG tablet Take 20 mg by mouth daily. 06/10/14  Yes Historical Provider, MD  hydrochlorothiazide (HYDRODIURIL) 25 MG tablet Take 25 mg by mouth daily.   Yes Historical Provider, MD  lisinopril (PRINIVIL,ZESTRIL) 20 MG tablet Take 20 mg by mouth daily.   Yes Historical Provider, MD  norethindrone-ethinyl estradiol (JUNEL 1/20) 1-20 MG-MCG tablet Take 1 tablet by mouth daily.   Yes Historical Provider, MD  omeprazole (PRILOSEC) 20 MG capsule Take 20 mg by mouth daily.   Yes Historical Provider, MD     History   Social History  . Marital Status: Married    Spouse Name: N/A    Number of Children: N/A  . Years of Education: N/A    Occupational History  . Not on file.   Social History Main Topics  . Smoking status: Never Smoker   . Smokeless tobacco: Never Used  . Alcohol Use: No  . Drug Use: No  . Sexual Activity: Yes   Other Topics Concern  . Not on file   Social History Narrative  . No narrative on file    No family status information on file.   History reviewed. No pertinent family history.   ROS:  Full 14 point review of systems complete and found to be negative unless listed above.  Physical Exam: Blood pressure 138/80, pulse 63, temperature 98.7 F (37.1 C), temperature source Oral, resp. rate 12, height  (1.651 m), last menstrual period 06/16/2014, SpO2 99.00%.  General: Well developed, well nourished, female in no acute distress Head: Eyes PERRLA, No xanthomas.   Normocephalic and atraumatic, oropharynx without edema or exudate. Dentition:  Lungs:  Heart: HRRR S1 S2, no rub/gallop, Heart irregular rate and rhythm with S1, S2  murmur. pulses are 2+ extrem.   Neck: No carotid bruits. No lymphadenopathy.  JVD. Abdomen: Bowel sounds present, abdomen soft and non-tender without masses or hernias noted. Msk:  No spine or cva tenderness. No weakness, no joint deformities or effusions. Extremities: No clubbing or cyanosis.  edema. L shoulder with pain on rotator cuff movements Neuro: Alert and oriented X 3. No focal deficits noted. Psych:  Good affect, responds appropriately Skin: No rashes or lesions noted.  Labs:   Lab Results  Component Value Date   WBC 7.1 07/07/2014   HGB 12.6 07/07/2014   HCT 38.0 07/07/2014   MCV 88.0 07/07/2014   PLT 249 07/07/2014     Recent Labs Lab 07/07/14 1154  NA 139  K 4.0  CL 103  CO2 21  BUN 13  CREATININE 0.75  CALCIUM 9.2  GLUCOSE 88    Recent Labs  07/07/14 1209  TROPIPOC 0.00    Echo: pending  ECG:  Sinus with no acute ST or TW changes  Radiology:  Dg Chest Port 1 View  07/07/2014   CLINICAL DATA:  Chest pain.  EXAM: PORTABLE  CHEST - 1 VIEW  COMPARISON:  November 20, 2012.  FINDINGS: The heart size and mediastinal contours are within normal limits. Both lungs are clear. No pneumothorax or pleural effusion is noted. The visualized skeletal structures are unremarkable.  IMPRESSION: No acute cardiopulmonary abnormality seen.   Electronically Signed   By: Roque Lias M.D.   On: 07/07/2014 12:14    ASSESSMENT AND PLAN:    Principal Problem:   Chest pain  Heather Hamilton is a 48 y.o. female with a history of HTN and GERD who presented to the [facility] ED with chest pain.    Chest pain, atypical; initilal ECG and trop are unremarkable. CT no PE.  -- Continue to cycle enzymes and serial ECG -- If she rules out overnight plan for a exercise myoview tomorrow -- Continue ASA, NTG -- CTA and ECHO pending.  HTN, stable;  -- ACE, Lasix and HCTZ held with IV contrast exposure and placed on prn hydralazine   GERD, cont PPI    Signed: Thereasa Parkin, PA-C 07/07/2014 4:20 PM  Pager [phone]  Co-Sign MD  Patient seen and examined with Deborha Payment, PA-C. We discussed all aspects of the encounter. I agree with the assessment and plan as stated above.   Doubt pain is cardiac in nature. Likely musculoskeletal. Will proceed with ETT/Myoview in am to further evaluate.  Daniel Bensimhon,MD 5:03 PM

## 2014-07-07 NOTE — ED Notes (Signed)
[place] [facility] with sudden onset CP while at church. Pt was given 1 nitro in route with some relief of CP. Pt currently reporting 4/10 cp. Pt reports radiation to neck, back, and headache at this time. Pt a/o x 4. VSS.

## 2014-07-08 ENCOUNTER — Observation Stay (HOSPITAL_COMMUNITY): Payer: 59

## 2014-07-08 DIAGNOSIS — K8000 Calculus of gallbladder with acute cholecystitis without obstruction: Secondary | ICD-10-CM | POA: Diagnosis not present

## 2014-07-08 DIAGNOSIS — R072 Precordial pain: Secondary | ICD-10-CM

## 2014-07-08 DIAGNOSIS — K219 Gastro-esophageal reflux disease without esophagitis: Secondary | ICD-10-CM | POA: Diagnosis not present

## 2014-07-08 DIAGNOSIS — R079 Chest pain, unspecified: Secondary | ICD-10-CM

## 2014-07-08 DIAGNOSIS — K8020 Calculus of gallbladder without cholecystitis without obstruction: Secondary | ICD-10-CM

## 2014-07-08 DIAGNOSIS — R1011 Right upper quadrant pain: Secondary | ICD-10-CM

## 2014-07-08 DIAGNOSIS — I517 Cardiomegaly: Secondary | ICD-10-CM

## 2014-07-08 DIAGNOSIS — I10 Essential (primary) hypertension: Secondary | ICD-10-CM | POA: Diagnosis not present

## 2014-07-08 LAB — HEPATIC FUNCTION PANEL
ALT: 13 U/L (ref 0–35)
AST: 18 U/L (ref 0–37)
Albumin: 3.4 g/dL — ABNORMAL LOW (ref 3.5–5.2)
Alkaline Phosphatase: 58 U/L (ref 39–117)
BILIRUBIN TOTAL: 0.4 mg/dL (ref 0.3–1.2)
Bilirubin, Direct: <0.2 mg/dL (ref 0.0–0.3)
Total Protein: 6.9 g/dL (ref 6.0–8.3)

## 2014-07-08 LAB — TROPONIN I: Troponin I: <0.3 ng/mL (ref <0.30)

## 2014-07-08 LAB — LIPASE, BLOOD: LIPASE: 35 U/L (ref 11–59)

## 2014-07-08 MED ORDER — TECHNETIUM TC 99M SESTAMIBI - CARDIOLITE
10.0000 | Freq: Once | INTRAVENOUS | Status: AC | PRN
Start: 2014-07-08 — End: 2014-07-08
  Administered 2014-07-08: 10 via INTRAVENOUS

## 2014-07-08 MED ORDER — DEXTROSE 5 % IV SOLN
1.0000 g | Freq: Three times a day (TID) | INTRAVENOUS | Status: DC
  Administered 2014-07-09 – 2014-07-10 (×5): 1 g via INTRAVENOUS
  Filled 2014-07-08 (×7): qty 1

## 2014-07-08 MED ORDER — FUROSEMIDE 20 MG PO TABS
20.0000 mg | ORAL_TABLET | Freq: Every day | ORAL | Status: DC
  Administered 2014-07-08 – 2014-07-10 (×2): 20 mg via ORAL
  Filled 2014-07-08 (×3): qty 1

## 2014-07-08 MED ORDER — TECHNETIUM TC 99M SESTAMIBI - CARDIOLITE
30.0000 | Freq: Once | INTRAVENOUS | Status: AC | PRN
  Administered 2014-07-08: 08:00:00 30 via INTRAVENOUS

## 2014-07-08 MED ORDER — LISINOPRIL 20 MG PO TABS
20.0000 mg | ORAL_TABLET | Freq: Every day | ORAL | Status: DC
Start: 2014-07-08 — End: 2014-07-10
  Administered 2014-07-08 – 2014-07-10 (×2): 20 mg via ORAL
  Filled 2014-07-08 (×3): qty 1

## 2014-07-08 NOTE — Progress Notes (Signed)
Treadmill began

## 2014-07-08 NOTE — Progress Notes (Signed)
  Echocardiogram 2D Echocardiogram has been performed.  Cathie Beams 07/08/2014, 4:08 PM

## 2014-07-08 NOTE — Progress Notes (Signed)
Treadmill nuclear stress test completed without complication. Does have persistent upper back and neck pain and headache during the test, however no CP. Pending result.   Ramond Dial PA Pager: [phone]

## 2014-07-08 NOTE — Progress Notes (Signed)
[facility] PROGRESS NOTE  Heather Hamilton ZOX:[id number] DOB: 11/11/1966 DOA: 07/07/2014 PCP: REDMON,NOELLE, PA-C  Assessment/Plan: 1. Chest/shoulder pain 1. Neg Stress 2. 2d Echo unremarkable with no WMA 3. Normal LFT's but RUQ [place] with pos murphy's sign and mult gallstones 4. Pt with localized tenderness on palpation over RUQ 5. Consult general surgery for further recs. NPO after midnight 2. HTN 1. BP stable 2. Cont home meds 3. GERD 1. Cont PPI  Code Status: Full Family Communication: Pt and husband in room Disposition Plan: Pending   Consultants:  Cardiology  General Surgery  Procedures:    Antibiotics:   (indicate start date, and stop date if known)  HPI/Subjective: Pt still complains of back and L shoulder pain, worse with eating.  Objective: Filed Vitals:   07/08/14 8119 07/08/14 0925 07/08/14 0928 07/08/14 1330  BP: 165/60 128/67 147/60 120/72  Pulse:    62  Temp:    99 F (37.2 C)  TempSrc:    Oral  Resp:    18  Height:      Weight:      SpO2:    100%    Intake/Output Summary (Last 24 hours) at 07/08/14 1723 Last data filed at 07/08/14 1300  Gross per 24 hour  Intake    240 ml  Output      0 ml  Net    240 ml   Filed Weights   07/08/14 0600  Weight: 86.637 kg (191 lb)    Exam:   General:  Awake, in nad  Cardiovascular: regular, s1, s2  Respiratory: normal resp effort, no wheezing  Abdomen: soft, pos BS, localized tenderness over RUQ  Musculoskeletal: perfused, no clubbing   Data Reviewed: Basic Metabolic Panel:  Recent Labs Lab 07/07/14 1154  NA 139  K 4.0  CL 103  CO2 21  GLUCOSE 88  BUN 13  CREATININE 0.75  CALCIUM 9.2   Liver Function Tests:  Recent Labs Lab 07/08/14 0525  AST 18  ALT 13  ALKPHOS 58  BILITOT 0.4  PROT 6.9  ALBUMIN 3.4*   No results found for this basename: LIPASE, AMYLASE,  in the last 168 hours No results found for this basename: AMMONIA,  in the last 168 hours CBC:  Recent  Labs Lab 07/07/14 1154  WBC 7.1  HGB 12.6  HCT 38.0  MCV 88.0  PLT 249   Cardiac Enzymes:  Recent Labs Lab 07/07/14 1830 07/07/14 2257 07/08/14 0525  TROPONINI <0.30 <0.30 <0.30   BNP (last 3 results) No results found for this basename: PROBNP,  in the last 8760 hours CBG: No results found for this basename: GLUCAP,  in the last 168 hours  No results found for this or any previous visit (from the past 240 hour(s)).   Studies: Nm Myocar Multi W/spect W/wall Motion / Ef  07/08/2014   CLINICAL DATA:  Chest pain  EXAM: Lexiscan Myovue  TECHNIQUE: The patient received IV Lexiscan .  over 15 seconds. 33.0 mCi of Technetium 13m Sestamibi injected at 30 seconds. Quantitative SPECT images were obtained in the vertical, horizontal and short axis planes after a 45 minute delay. Rest images were obtained with similar planes and delay using 10.2 mCi of Technetium 68m Sestamibi.  FINDINGS: ECG: Baseline electrocardiogram shows sinus rhythm with no ST changes.  Symptoms: The patient exercised for 7 min and 29 seconds. The patient's heart rate at rest was 77 and increased to 160 which was 94% of predicted maximal heart rate. Blood pressure at  rest 129/62 and increased to 165/60. There was no chest pain and no arrhythmias. No ST changes. Study terminated due to fatigue.  RAW Data:  Adequate image acquisition.  Quantitiative Gated SPECT EF: The gated ejection fraction was 74% and the wall motion was normal. End-diastolic volume 77 mL. End systolic volume 57 mL. TID -0.85.  Perfusion Images: The stress images reveal a small, mild intensity defect in the distal anterior wall. When compared to the rest images there was no reversibility noted.  IMPRESSION: Low risk stress nuclear study with no chest pain and no electrocardiographic changes. The scintigraphic results show probable breast attenuation but no ischemia. The gated ejection fraction was 74% and no wall motion was normal.  Olga Millers    Electronically Signed   By: Olga Millers   On: 07/08/2014 15:28   Dg Chest Port 1 View  07/07/2014   CLINICAL DATA:  Chest pain.  EXAM: PORTABLE CHEST - 1 VIEW  COMPARISON:  November 20, 2012.  FINDINGS: The heart size and mediastinal contours are within normal limits. Both lungs are clear. No pneumothorax or pleural effusion is noted. The visualized skeletal structures are unremarkable.  IMPRESSION: No acute cardiopulmonary abnormality seen.   Electronically Signed   By: Roque Lias M.D.   On: 07/07/2014 12:14   Ct Angio Chest Aortic Dissect W &/or W/o  07/07/2014   CLINICAL DATA:  Chest pain that radiates into the back and left shoulder. Headache. Question dissection.  EXAM: CT ANGIOGRAPHY CHEST WITH CONTRAST  TECHNIQUE: Multidetector CT imaging of the chest was performed using the standard protocol during bolus administration of intravenous contrast. Multiplanar CT image reconstructions and MIPs were obtained to evaluate the vascular anatomy.  CONTRAST:  80mL OMNIPAQUE IOHEXOL 350 MG/ML SOLN  COMPARISON:  Radiographs 07/07/2014.  CT 10/01/2004.  FINDINGS: Vascular: Pre contrast images demonstrate no displaced intimal calcifications or mediastinal hematoma. Post-contrast, the thoracic aorta enhances normally. There is no evidence of aneurysm or dissection. The pulmonary arteries are satisfactorily opacified with contrast. There is no evidence of acute pulmonary embolism. No significant atherosclerosis is seen.  Mediastinum: There are no enlarged mediastinal, hilar or axillary lymph nodes. The thyroid gland, trachea and esophagus appear normal. The heart size is normal.  Lungs/Pleura: There is no pleural or pericardial effusion.The lungs are clear aside from minimal dependent atelectasis bilaterally.  Upper abdomen: Unremarkable.  There is no adrenal mass.  Musculoskeletal/Chest wall: No chest wall lesion or acute osseous findings.  Review of the MIP images confirms the above findings.  IMPRESSION: Negative  chest CTA. No evidence of aortic dissection, pulmonary embolism or other acute chest process.   Electronically Signed   By: Roxy Horseman M.D.   On: 07/07/2014 16:49   US Abdomen Limited Ruq  07/08/2014   CLINICAL DATA:  48 year old female with right upper quadrant pain. Initial encounter.  EXAM: US ABDOMEN LIMITED - RIGHT UPPER QUADRANT  COMPARISON:  Chest CTA 07/07/2014.  FINDINGS: Gallbladder:  Small gallstones, individually up to 7 mm diameter. Gallbladder wall thickness remains normal, and no pericholecystic fluid is identified, but the technologist reports a positive sonographic Murphy sign.  Common bile duct:  Diameter: 3 mm, normal  Liver:  No focal lesion identified. Within normal limits in parenchymal echogenicity. No intrahepatic biliary ductal dilatation identified.  Other findings: Negative visible right kidney. Visible right pancreas within normal limits.  IMPRESSION: 1. Positive gallstones and positive sonographic Murphy sign reported, suspicious for acute cholecystitis despite the absence of gallbladder wall thickening. 2. No  evidence of biliary obstruction. These results will be called to the ordering clinician or representative by the Radiologist Assistant, and communication documented in the PACS or zVision Dashboard.   Electronically Signed   By: Augusto Gamble M.D.   On: 07/08/2014 16:35    Scheduled Meds: . aspirin  325 mg Oral Daily  . enoxaparin (LOVENOX) injection  40 mg Subcutaneous Q24H  . furosemide  20 mg Oral Daily  . lisinopril  20 mg Oral Daily  . pantoprazole  40 mg Oral Daily  . sodium chloride  3 mL Intravenous Q12H   Continuous Infusions:   Principal Problem:   Chest pain    Time spent:    Rhonda Vangieson K  [facility] Pager [phone]. If 7PM-7AM, please contact night-coverage at [email or web address], password [facility] 07/08/2014, 5:23 PM  LOS: 1 day

## 2014-07-08 NOTE — Progress Notes (Signed)
Patient: Heather Hamilton / Admit Date: 07/07/2014 / Date of Encounter: 07/08/2014, 7:40 AM   Subjective: No further chest pain since admission however she did have constant dull mid thoracic back pain all night that radiated superiorly to the base of her neck and towards her left trapezius. Denies any recent injury. Some right upper extremity paraesthesias overnight. No further SOB, nausea, or diaphoresis. No cough. Troponin negative x 3. EKG NSR, 62, no st/t changes. She is for ETT/Myoview today. Echo pending.      Objective: Telemetry: NSR, 80s Physical Exam: Blood pressure 129/63, pulse 78, temperature 98.5 F (36.9 C), temperature source Oral, resp. rate 18, height  (1.651 m), weight 191 lb (86.637 kg), last menstrual period 06/16/2014, SpO2 100.00%. General: Well developed, well nourished, in no acute distress. Make-up applied.  Head: Normocephalic, atraumatic, sclera non-icteric, no xanthomas, nares are without discharge. Neck: Negative for carotid bruits. JVP not elevated. Lungs: Clear bilaterally to auscultation without wheezes, rales, or rhonchi. Breathing is unlabored. Heart: RRR S1 S2 without murmurs, rubs, or gallops.  Abdomen: Soft, non-tender, non-distended with normoactive bowel sounds. No rebound/guarding. Extremities: No clubbing or cyanosis. No edema. Distal pedal pulses are 2+ and equal bilaterally. Musculoskeletal: TTP over the mid-thoracic spine-->superiorly to the base of the neck. Also TTP over the left trapezius. No difference in discomfort when supine vs sitting/leaning forward. No bruising, erythema, or swelling.     Neuro: Alert and oriented X 3. Moves all extremities spontaneously. Psych:  Responds to questions appropriately with a normal affect.   Intake/Output Summary (Last 24 hours) at 07/08/14 0740 Last data filed at 07/07/14 2200  Gross per 24 hour  Intake    240 ml  Output      0 ml  Net    240 ml    Inpatient Medications:  . aspirin  325 mg  Oral STAT  . aspirin  325 mg Oral Daily  . enoxaparin (LOVENOX) injection  40 mg Subcutaneous Q24H  . pantoprazole  40 mg Oral Daily  . sodium chloride  3 mL Intravenous Q12H   Infusions:    Labs:  Recent Labs  07/07/14 1154  NA 139  K 4.0  CL 103  CO2 21  GLUCOSE 88  BUN 13  CREATININE 0.75  CALCIUM 9.2   No results found for this basename: AST, ALT, ALKPHOS, BILITOT, PROT, ALBUMIN,  in the last 72 hours  Recent Labs  07/07/14 1154  WBC 7.1  HGB 12.6  HCT 38.0  MCV 88.0  PLT 249    Recent Labs  07/07/14 1830 07/07/14 2257 07/08/14 0525  TROPONINI <0.30 <0.30 <0.30   No components found with this basename: POCBNP,  No results found for this basename: HGBA1C,  in the last 72 hours   Radiology/Studies:  Dg Chest Port 1 View  07/07/2014   CLINICAL DATA:  Chest pain.  EXAM: PORTABLE CHEST - 1 VIEW  COMPARISON:  November 20, 2012.  FINDINGS: The heart size and mediastinal contours are within normal limits. Both lungs are clear. No pneumothorax or pleural effusion is noted. The visualized skeletal structures are unremarkable.  IMPRESSION: No acute cardiopulmonary abnormality seen.   Electronically Signed   By: Roque Lias M.D.   On: 07/07/2014 12:14   Mm Digital Screening Bilateral  06/18/2014   CLINICAL DATA:  Screening.  EXAM: DIGITAL SCREENING BILATERAL MAMMOGRAM WITH CAD  COMPARISON:  Previous exam(s).  ACR Breast Density Category b: There are scattered areas of fibroglandular density.  FINDINGS: There are no findings suspicious for malignancy. Images were processed with CAD.  IMPRESSION: No mammographic evidence of malignancy. A result letter of this screening mammogram will be mailed directly to the patient.  RECOMMENDATION: Screening mammogram in one year. (Code:SM-B-01Y)  BI-RADS CATEGORY  1: Negative.   Electronically Signed   By: Randy  Jackson M.D.   On: 08/04/201Anselmo Picklert Angio Chest Aortic Dissect W &/or W/o  07/07/2014   CLINICAL DATA:  Chest pain that  radiates into the back and left shoulder. Headache. Question dissection.  EXAM: CT ANGIOGRAPHY CHEST WITH CONTRAST  TECHNIQUE: Multidetector CT imaging of the chest was performed using the standard protocol during bolus administration of intravenous contrast. Multiplanar CT image reconstructions and MIPs were obtained to evaluate the vascular anatomy.  CONTRAST:  80mL OMNIPAQUE IOHEXOL 350 MG/ML SOLN  COMPARISON:  Radiographs 07/07/2014.  CT 10/01/2004.  FINDINGS: Vascular: Pre contrast images demonstrate no displaced intimal calcifications or mediastinal hematoma. Post-contrast, the thoracic aorta enhances normally. There is no evidence of aneurysm or dissection. The pulmonary arteries are satisfactorily opacified with contrast. There is no evidence of acute pulmonary embolism. No significant atherosclerosis is seen.  Mediastinum: There are no enlarged mediastinal, hilar or axillary lymph nodes. The thyroid gland, trachea and esophagus appear normal. The heart size is normal.  Lungs/Pleura: There is no pleural or pericardial effusion.The lungs are clear aside from minimal dependent atelectasis bilaterally.  Upper abdomen: Unremarkable.  There is no adrenal mass.  Musculoskeletal/Chest wall: No chest wall lesion or acute osseous findings.  Review of the MIP images confirms the above findings.  IMPRESSION: Negative chest CTA. No evidence of aortic dissection, pulmonary embolism or other acute chest process.   Electronically Signed   By: Roxy Horseman M.D.   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 There was some associated numbness, tingling, SOB, and diaphoresis-all resolved now. She denies any exertional symptoms, very active at baseline without symptoms.   1. Chest pain: -Atypical in presentation  -EKG nl and troponin  negative x 3 -No evidence of aortic dissection, PE, or other acute process on chest CTA 07/07/14 -On aspirin 81 mg and Nitro -Echo pending -She is for ETT/Myoview today, if normal consider other etiologies such as her GERD vs MSK (currently on Protonix 40 mg daily). She is TTP over the thoracic spine. May benefit from muscle relaxer as her pain is now radiating up into her neck in along her trapezius.  -UDS ok, no leukocytosis     2. HTN -Well controlled since admitted -On lisinopril 20 mg, Lasix 20 mg daily, and HCTZ 25 mg daily at home.  -Prior to d/c would stop HCTZ and manage patient on lisinopril and Lasix, not both (dosing pending EF and BP response while inpatient)  3. GERD -On Protonix 40 mg daily   Signed, Eula Listen, PA-C 07/08/2014 7:46 AM  History and all data above reviewed.  Patient examined.  I agree with the findings as above.  Now with some back pain The patient exam reveals COR:RRR, no rub  ,  Lungs: Clear  ,  Abd: Positive bowel sounds, no rebound no guarding, Ext No edema  .  All available labs, radiology testing, previous records reviewed. Agree with documented assessment and plan. Chest pain.  Atypical.  No further work up if stress perfusion results are normal.    Fayrene Fearing Kaeleb Emond  11:24 AM  07/08/2014

## 2014-07-08 NOTE — Consult Note (Signed)
Reason for Consult:ab pain Referring Physician: dr Richardson Landry chiu  Heather Hamilton is an 48 y.o. female.  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 She has had this before and been evaluated for gb disease.  She does not have cardiac source after evaluation and then was complaining of burping and some ab pain with the back pain. No emesis.  She points to ruq when asked where it hurts. She underwent US that shows gallstones and positive sonographic murphys sign.  We are asked to see her. She has attempted to eat dinner and has been unable due to ruq pain.  Past Medical History  Diagnosis Date  . Hypertension   . GERD (gastroesophageal reflux disease)     Past Surgical History  Procedure Laterality Date  . Cesarean section      History reviewed. No pertinent family history.  Social History:  reports that she has never smoked. She has never used smokeless tobacco. She reports that she does not drink alcohol or use illicit drugs.  Allergies: No Known Allergies  Medications: I have reviewed the patient's current medications.  Results for orders placed during the hospital encounter of 07/07/14 (from the past 48 hour(s))  CBC     Status: None   Collection Time    07/07/14 11:54 AM      Result Value Ref Range   WBC 7.1  4.0 - 10.5 K/uL   RBC 4.32  3.87 - 5.11 MIL/uL   Hemoglobin 12.6  12.0 - 15.0 g/dL   HCT 38.0  36.0 - 46.0 %   MCV 88.0  78.0 - 100.0 fL   MCH 29.2  26.0 - 34.0 pg   MCHC 33.2  30.0 - 36.0 g/dL   RDW 13.2  11.5 - 15.5 %   Platelets 249  150 - 400 K/uL  BASIC METABOLIC PANEL     Status: None   Collection Time    07/07/14 11:54 AM      Result Value Ref Range   Sodium 139  137 - 147 mEq/L   Potassium 4.0  3.7 - 5.3 mEq/L   Chloride 103  96 - 112 mEq/L   CO2 21  19 - 32 mEq/L   Glucose, Bld 88  70 - 99 mg/dL   BUN 13  6 - 23 mg/dL   Creatinine, Ser 0.75  0.50 - 1.10 mg/dL   Calcium 9.2  8.4 - 10.5 mg/dL    GFR calc non Af Amer >90  >90 mL/min   GFR calc Af Amer >90  >90 mL/min   Comment: (NOTE)     The eGFR has been calculated using the CKD EPI equation.     This calculation has not been validated in all clinical situations.     eGFR's persistently <90 mL/min signify possible Chronic Kidney     Disease.   Anion gap 15  5 - 15  I-STAT TROPOININ, ED     Status: None   Collection Time    07/07/14 12:09 PM      Result Value Ref Range   Troponin i, poc 0.00  0.00 - 0.08 ng/mL   Comment 3            Comment: Due to the release kinetics of cTnI,     a negative result within the first hours     of the onset of symptoms does not rule out     myocardial  infarction with certainty.     If myocardial infarction is still suspected,     repeat the test at appropriate intervals.  TROPONIN I     Status: None   Collection Time    07/07/14  6:30 PM      Result Value Ref Range   Troponin I <0.30  <0.30 ng/mL   Comment:            Due to the release kinetics of cTnI,     a negative result within the first hours     of the onset of symptoms does not rule out     myocardial infarction with certainty.     If myocardial infarction is still suspected,     repeat the test at appropriate intervals.  TROPONIN I     Status: None   Collection Time    07/07/14 10:57 PM      Result Value Ref Range   Troponin I <0.30  <0.30 ng/mL   Comment:            Due to the release kinetics of cTnI,     a negative result within the first hours     of the onset of symptoms does not rule out     myocardial infarction with certainty.     If myocardial infarction is still suspected,     repeat the test at appropriate intervals.  URINE RAPID DRUG SCREEN (HOSP PERFORMED)     Status: Abnormal   Collection Time    07/07/14 11:25 PM      Result Value Ref Range   Opiates POSITIVE (*) NONE DETECTED   Cocaine NONE DETECTED  NONE DETECTED   Benzodiazepines NONE DETECTED  NONE DETECTED   Amphetamines NONE DETECTED  NONE  DETECTED   Tetrahydrocannabinol NONE DETECTED  NONE DETECTED   Barbiturates NONE DETECTED  NONE DETECTED   Comment:            DRUG SCREEN FOR MEDICAL PURPOSES     ONLY.  IF CONFIRMATION IS NEEDED     FOR ANY PURPOSE, NOTIFY LAB     WITHIN 5 DAYS.                LOWEST DETECTABLE LIMITS     FOR URINE DRUG SCREEN     Drug Class       Cutoff (ng/mL)     Amphetamine      1000     Barbiturate      200     Benzodiazepine   219     Tricyclics       758     Opiates          300     Cocaine          300     THC              50  TROPONIN I     Status: None   Collection Time    07/08/14  5:25 AM      Result Value Ref Range   Troponin I <0.30  <0.30 ng/mL   Comment:            Due to the release kinetics of cTnI,     a negative result within the first hours     of the onset of symptoms does not rule out     myocardial infarction with certainty.     If myocardial infarction is still suspected,  repeat the test at appropriate intervals.  HEPATIC FUNCTION PANEL     Status: Abnormal   Collection Time    07/08/14  5:25 AM      Result Value Ref Range   Total Protein 6.9  6.0 - 8.3 g/dL   Albumin 3.4 (*) 3.5 - 5.2 g/dL   AST 18  0 - 37 U/L   ALT 13  0 - 35 U/L   Alkaline Phosphatase 58  39 - 117 U/L   Total Bilirubin 0.4  0.3 - 1.2 mg/dL   Bilirubin, Direct <0.2  0.0 - 0.3 mg/dL   Indirect Bilirubin NOT CALCULATED  0.3 - 0.9 mg/dL    Nm Myocar Multi W/spect W/wall Motion / Ef  07/08/2014   CLINICAL DATA:  Chest pain  EXAM: Lexiscan Myovue  TECHNIQUE: The patient received IV Lexiscan .10m over 15 seconds. 33.0 mCi of Technetium 927mestamibi injected at 30 seconds. Quantitative SPECT images were obtained in the vertical, horizontal and short axis planes after a 45 minute delay. Rest images were obtained with similar planes and delay using 10.2 mCi of Technetium 9934mstamibi.  FINDINGS: ECG: Baseline electrocardiogram shows sinus rhythm with no ST changes.  Symptoms: The patient  exercised for 7 min and 29 seconds. The patient's heart rate at rest was 77 and increased to 160 which was 94% of predicted maximal heart rate. Blood pressure at rest 129/62 and increased to 165/60. There was no chest pain and no arrhythmias. No ST changes. Study terminated due to fatigue.  RAW Data:  Adequate image acquisition.  Quantitiative Gated SPECT EF: The gated ejection fraction was 74% and the wall motion was normal. End-diastolic volume 77 mL. End systolic volume 57 mL. TID -0.85.  Perfusion Images: The stress images reveal a small, mild intensity defect in the distal anterior wall. When compared to the rest images there was no reversibility noted.  IMPRESSION: Low risk stress nuclear study with no chest pain and no electrocardiographic changes. The scintigraphic results show probable breast attenuation but no ischemia. The gated ejection fraction was 74% and no wall motion was normal.  BriKirk RuthsElectronically Signed   By: BriKirk RuthsOn: 07/08/2014 15:28   Dg Chest Port 1 View  07/07/2014   CLINICAL DATA:  Chest pain.  EXAM: PORTABLE CHEST - 1 VIEW  COMPARISON:  November 20, 2012.  FINDINGS: The heart size and mediastinal contours are within normal limits. Both lungs are clear. No pneumothorax or pleural effusion is noted. The visualized skeletal structures are unremarkable.  IMPRESSION: No acute cardiopulmonary abnormality seen.   Electronically Signed   By: JamSabino DickD.   On: 07/07/2014 12:14   Ct Angio Chest Aortic Dissect W &/or W/o  07/07/2014   CLINICAL DATA:  Chest pain that radiates into the back and left shoulder. Headache. Question dissection.  EXAM: CT ANGIOGRAPHY CHEST WITH CONTRAST  TECHNIQUE: Multidetector CT imaging of the chest was performed using the standard protocol during bolus administration of intravenous contrast. Multiplanar CT image reconstructions and MIPs were obtained to evaluate the vascular anatomy.  CONTRAST:  74m56mNIPAQUE IOHEXOL 350 MG/ML SOLN   COMPARISON:  Radiographs 07/07/2014.  CT 10/01/2004.  FINDINGS: Vascular: Pre contrast images demonstrate no displaced intimal calcifications or mediastinal hematoma. Post-contrast, the thoracic aorta enhances normally. There is no evidence of aneurysm or dissection. The pulmonary arteries are satisfactorily opacified with contrast. There is no evidence of acute pulmonary embolism. No significant atherosclerosis is seen.  Mediastinum:  There are no enlarged mediastinal, hilar or axillary lymph nodes. The thyroid gland, trachea and esophagus appear normal. The heart size is normal.  Lungs/Pleura: There is no pleural or pericardial effusion.The lungs are clear aside from minimal dependent atelectasis bilaterally.  Upper abdomen: Unremarkable.  There is no adrenal mass.  Musculoskeletal/Chest wall: No chest wall lesion or acute osseous findings.  Review of the MIP images confirms the above findings.  IMPRESSION: Negative chest CTA. No evidence of aortic dissection, pulmonary embolism or other acute chest process.   Electronically Signed   By: Camie Patience M.D.   On: 07/07/2014 16:49   US Abdomen Limited Ruq  07/08/2014   CLINICAL DATA:  48 year old female with right upper quadrant pain. Initial encounter.  EXAM: US ABDOMEN LIMITED - RIGHT UPPER QUADRANT  COMPARISON:  Chest CTA 07/07/2014.  FINDINGS: Gallbladder:  Small gallstones, individually up to 7 mm diameter. Gallbladder wall thickness remains normal, and no pericholecystic fluid is identified, but the technologist reports a positive sonographic Murphy sign.  Common bile duct:  Diameter: 3 mm, normal  Liver:  No focal lesion identified. Within normal limits in parenchymal echogenicity. No intrahepatic biliary ductal dilatation identified.  Other findings: Negative visible right kidney. Visible right pancreas within normal limits.  IMPRESSION: 1. Positive gallstones and positive sonographic Murphy sign reported, suspicious for acute cholecystitis despite the  absence of gallbladder wall thickening. 2. No evidence of biliary obstruction. These results will be called to the ordering clinician or representative by the Radiologist Assistant, and communication documented in the PACS or zVision Dashboard.   Electronically Signed   By: Lars Pinks M.D.   On: 07/08/2014 16:35    Review of Systems  Constitutional: Negative for fever and chills.  Respiratory: Negative for shortness of breath.   Cardiovascular: Positive for chest pain.  Gastrointestinal: Positive for heartburn and abdominal pain. Negative for nausea, vomiting, diarrhea and constipation.  Musculoskeletal: Positive for back pain.   Blood pressure 120/72, pulse 62, temperature 99 F (37.2 C), temperature source Oral, resp. rate 18, height '5\' 5"'  (1.651 m), weight 191 lb (86.637 kg), last menstrual period 06/16/2014, SpO2 100.00%. Physical Exam  Vitals reviewed. Constitutional: She appears well-developed and well-nourished.  Eyes: No scleral icterus.  Cardiovascular: Normal rate, regular rhythm and normal heart sounds.   Respiratory: Effort normal and breath sounds normal. She has no wheezes. She has no rales.  GI: Soft. Bowel sounds are normal. She exhibits no distension. There is tenderness in the right upper quadrant. There is positive Murphy's sign.  Lymphadenopathy:    She has no cervical adenopathy.    Assessment/Plan: Cholecystitis  She has cholecystitis by exam. I do think source of pain is her gallbladder. i recommended lap chole and we discussed procedure. Will make npo after mn tonight and begin abx.  Sherel Fennell 07/08/2014, 8:39 PM

## 2014-07-09 ENCOUNTER — Encounter (HOSPITAL_COMMUNITY)
Admission: EM | Disposition: A | Discharge status: Home / Self Care | Payer: Self-pay | Source: Home / Self Care | Attending: Emergency Medicine

## 2014-07-09 ENCOUNTER — Encounter (HOSPITAL_COMMUNITY): Payer: 59 | Admitting: Anesthesiology

## 2014-07-09 ENCOUNTER — Observation Stay (HOSPITAL_COMMUNITY): Payer: 59 | Admitting: Anesthesiology

## 2014-07-09 DIAGNOSIS — K8010 Calculus of gallbladder with chronic cholecystitis without obstruction: Secondary | ICD-10-CM

## 2014-07-09 HISTORY — PX: CHOLECYSTECTOMY: SHX55

## 2014-07-09 LAB — COMPREHENSIVE METABOLIC PANEL
ALBUMIN: 3.4 g/dL — AB (ref 3.5–5.2)
ALT: 13 U/L (ref 0–35)
ANION GAP: 14 (ref 5–15)
AST: 13 U/L (ref 0–37)
Alkaline Phosphatase: 59 U/L (ref 39–117)
BUN: 14 mg/dL (ref 6–23)
CO2: 22 mEq/L (ref 19–32)
CREATININE: 0.73 mg/dL (ref 0.50–1.10)
Calcium: 9.1 mg/dL (ref 8.4–10.5)
Chloride: 104 mEq/L (ref 96–112)
GFR calc non Af Amer: >90 mL/min (ref >90)
GLUCOSE: 89 mg/dL (ref 70–99)
Potassium: 4 mEq/L (ref 3.7–5.3)
Sodium: 140 mEq/L (ref 137–147)
TOTAL PROTEIN: 7.1 g/dL (ref 6.0–8.3)
Total Bilirubin: 0.4 mg/dL (ref 0.3–1.2)

## 2014-07-09 SURGERY — LAPAROSCOPIC CHOLECYSTECTOMY WITH INTRAOPERATIVE CHOLANGIOGRAM
Anesthesia: General | Site: Abdomen

## 2014-07-09 MED ORDER — LIDOCAINE HCL (CARDIAC) 20 MG/ML IV SOLN
INTRAVENOUS | Status: AC
  Filled 2014-07-09: qty 5

## 2014-07-09 MED ORDER — 0.9 % SODIUM CHLORIDE (POUR BTL) OPTIME
TOPICAL | Status: DC | PRN
  Administered 2014-07-09: 1000 mL

## 2014-07-09 MED ORDER — MIDAZOLAM HCL 2 MG/2ML IJ SOLN
INTRAMUSCULAR | Status: AC
  Filled 2014-07-09: qty 2

## 2014-07-09 MED ORDER — ROCURONIUM BROMIDE 50 MG/5ML IV SOLN
INTRAVENOUS | Status: AC
  Filled 2014-07-09: qty 1

## 2014-07-09 MED ORDER — FENTANYL CITRATE 0.05 MG/ML IJ SOLN: 25.0000 ug | INTRAMUSCULAR | Status: DC | PRN

## 2014-07-09 MED ORDER — GLYCOPYRROLATE 0.2 MG/ML IJ SOLN
INTRAMUSCULAR | Status: DC | PRN
  Administered 2014-07-09: 0.4 mg via INTRAVENOUS

## 2014-07-09 MED ORDER — PROPOFOL 10 MG/ML IV BOLUS
INTRAVENOUS | Status: DC | PRN
  Administered 2014-07-09: 150 mg via INTRAVENOUS

## 2014-07-09 MED ORDER — LIDOCAINE HCL (CARDIAC) 20 MG/ML IV SOLN
INTRAVENOUS | Status: DC | PRN
  Administered 2014-07-09: 50 mg via INTRAVENOUS

## 2014-07-09 MED ORDER — MIDAZOLAM HCL 5 MG/5ML IJ SOLN
INTRAMUSCULAR | Status: DC | PRN
  Administered 2014-07-09: 2 mg via INTRAVENOUS

## 2014-07-09 MED ORDER — ARTIFICIAL TEARS OP OINT
TOPICAL_OINTMENT | OPHTHALMIC | Status: DC | PRN
  Administered 2014-07-09: 1 via OPHTHALMIC

## 2014-07-09 MED ORDER — GLYCOPYRROLATE 0.2 MG/ML IJ SOLN
INTRAMUSCULAR | Status: AC
  Filled 2014-07-09: qty 2

## 2014-07-09 MED ORDER — LACTATED RINGERS IV SOLN
INTRAVENOUS | Status: DC | PRN
  Administered 2014-07-09: 10:00:00 via INTRAVENOUS

## 2014-07-09 MED ORDER — FENTANYL CITRATE 0.05 MG/ML IJ SOLN
INTRAMUSCULAR | Status: AC
  Filled 2014-07-09: qty 5

## 2014-07-09 MED ORDER — LACTATED RINGERS IV SOLN
INTRAVENOUS | Status: DC
  Administered 2014-07-09: 1000 mL via INTRAVENOUS
  Administered 2014-07-10: 05:00:00 via INTRAVENOUS

## 2014-07-09 MED ORDER — GLYCOPYRROLATE 0.2 MG/ML IJ SOLN
INTRAMUSCULAR | Status: AC
  Filled 2014-07-09: qty 3

## 2014-07-09 MED ORDER — DEXAMETHASONE SODIUM PHOSPHATE 4 MG/ML IJ SOLN
INTRAMUSCULAR | Status: DC | PRN
  Administered 2014-07-09: 4 mg via INTRAVENOUS

## 2014-07-09 MED ORDER — ARTIFICIAL TEARS OP OINT
TOPICAL_OINTMENT | OPHTHALMIC | Status: AC
  Filled 2014-07-09: qty 3.5

## 2014-07-09 MED ORDER — ONDANSETRON HCL 4 MG/2ML IJ SOLN
INTRAMUSCULAR | Status: DC | PRN
  Administered 2014-07-09: 4 mg via INTRAVENOUS

## 2014-07-09 MED ORDER — PROMETHAZINE HCL 25 MG/ML IJ SOLN: 6.2500 mg | INTRAMUSCULAR | Status: DC | PRN

## 2014-07-09 MED ORDER — MEPERIDINE HCL 25 MG/ML IJ SOLN: 6.2500 mg | INTRAMUSCULAR | Status: DC | PRN

## 2014-07-09 MED ORDER — ROCURONIUM BROMIDE 100 MG/10ML IV SOLN
INTRAVENOUS | Status: DC | PRN
  Administered 2014-07-09: 40 mg via INTRAVENOUS

## 2014-07-09 MED ORDER — PROPOFOL 10 MG/ML IV BOLUS
INTRAVENOUS | Status: AC
  Filled 2014-07-09: qty 20

## 2014-07-09 MED ORDER — OXYCODONE HCL 5 MG PO TABS
5.0000 mg | ORAL_TABLET | ORAL | Status: DC | PRN
Start: 2014-07-09 — End: 2014-07-10
  Administered 2014-07-09 – 2014-07-10 (×3): 10 mg via ORAL
  Filled 2014-07-09 (×3): qty 2

## 2014-07-09 MED ORDER — BUPIVACAINE-EPINEPHRINE (PF) 0.25% -1:200000 IJ SOLN
INTRAMUSCULAR | Status: AC
  Filled 2014-07-09: qty 30

## 2014-07-09 MED ORDER — SODIUM CHLORIDE 0.9 % IV SOLN
INTRAVENOUS | Status: DC | PRN
  Administered 2014-07-09: 10:00:00

## 2014-07-09 MED ORDER — DEXAMETHASONE SODIUM PHOSPHATE 4 MG/ML IJ SOLN
INTRAMUSCULAR | Status: AC
  Filled 2014-07-09: qty 1

## 2014-07-09 MED ORDER — FENTANYL CITRATE 0.05 MG/ML IJ SOLN
INTRAMUSCULAR | Status: DC | PRN
  Administered 2014-07-09: 50 ug via INTRAVENOUS
  Administered 2014-07-09: 100 ug via INTRAVENOUS

## 2014-07-09 MED ORDER — BUPIVACAINE-EPINEPHRINE 0.25% -1:200000 IJ SOLN
INTRAMUSCULAR | Status: DC | PRN
  Administered 2014-07-09: 30 mL

## 2014-07-09 MED ORDER — NEOSTIGMINE METHYLSULFATE 10 MG/10ML IV SOLN
INTRAVENOUS | Status: DC | PRN
  Administered 2014-07-09: 3 mg via INTRAVENOUS

## 2014-07-09 MED ORDER — SCOPOLAMINE 1 MG/3DAYS TD PT72
1.0000 | MEDICATED_PATCH | TRANSDERMAL | Status: DC
  Administered 2014-07-09: 1.5 mg via TRANSDERMAL
  Filled 2014-07-09: qty 1

## 2014-07-09 MED ORDER — ONDANSETRON HCL 4 MG/2ML IJ SOLN
INTRAMUSCULAR | Status: AC
  Filled 2014-07-09: qty 2

## 2014-07-09 MED ORDER — SODIUM CHLORIDE 0.9 % IR SOLN
Status: DC | PRN
  Administered 2014-07-09 (×2): 1000 mL

## 2014-07-09 SURGICAL SUPPLY — 46 items
ADH SKN CLS APL DERMABOND .7 (GAUZE/BANDAGES/DRESSINGS) ×1
APPLIER CLIP 5 13 M/L LIGAMAX5 (MISCELLANEOUS) ×2
APR CLP MED LRG 5 ANG JAW (MISCELLANEOUS) ×1
BAG SPEC RTRVL 10 TROC 200 (ENDOMECHANICALS) ×1
CANISTER SUCTION 2500CC (MISCELLANEOUS) ×2 IMPLANT
CHLORAPREP W/TINT 26ML (MISCELLANEOUS) ×2 IMPLANT
CLIP APPLIE 5 13 M/L LIGAMAX5 (MISCELLANEOUS) ×1 IMPLANT
COVER MAYO STAND STRL (DRAPES) ×2 IMPLANT
COVER SURGICAL LIGHT HANDLE (MISCELLANEOUS) ×2 IMPLANT
DERMABOND ADVANCED (GAUZE/BANDAGES/DRESSINGS) ×1
DERMABOND ADVANCED .7 DNX12 (GAUZE/BANDAGES/DRESSINGS) ×1 IMPLANT
DRAPE C-ARM 42X72 X-RAY (DRAPES) ×2 IMPLANT
DRAPE UTILITY 15X26 W/TAPE STR (DRAPE) ×4 IMPLANT
ELECT REM PT RETURN 9FT ADLT (ELECTROSURGICAL) ×2
ELECTRODE REM PT RTRN 9FT ADLT (ELECTROSURGICAL) ×1 IMPLANT
FILTER SMOKE EVAC LAPAROSHD (FILTER) ×2 IMPLANT
GLOVE BIO SURGEON STRL SZ 6.5 (GLOVE) ×1 IMPLANT
GLOVE BIO SURGEON STRL SZ7 (GLOVE) ×1 IMPLANT
GLOVE BIO SURGEON STRL SZ8 (GLOVE) ×2 IMPLANT
GLOVE BIOGEL PI IND STRL 7.0 (GLOVE) IMPLANT
GLOVE BIOGEL PI IND STRL 8 (GLOVE) ×1 IMPLANT
GLOVE BIOGEL PI INDICATOR 7.0 (GLOVE) ×3
GLOVE BIOGEL PI INDICATOR 8 (GLOVE) ×1
GLOVE SURG SS PI 7.0 STRL IVOR (GLOVE) ×1 IMPLANT
GOWN STRL REUS W/ TWL LRG LVL3 (GOWN DISPOSABLE) ×2 IMPLANT
GOWN STRL REUS W/ TWL XL LVL3 (GOWN DISPOSABLE) ×1 IMPLANT
GOWN STRL REUS W/TWL LRG LVL3 (GOWN DISPOSABLE) ×4
GOWN STRL REUS W/TWL XL LVL3 (GOWN DISPOSABLE) ×2
KIT BASIN OR (CUSTOM PROCEDURE TRAY) ×2 IMPLANT
KIT ROOM TURNOVER OR (KITS) ×2 IMPLANT
NEEDLE 22X1 1/2 (OR ONLY) (NEEDLE) ×2 IMPLANT
NS IRRIG 1000ML POUR BTL (IV SOLUTION) ×2 IMPLANT
PAD ARMBOARD 7.5X6 YLW CONV (MISCELLANEOUS) ×2 IMPLANT
POUCH RETRIEVAL ECOSAC 10 (ENDOMECHANICALS) ×1 IMPLANT
POUCH RETRIEVAL ECOSAC 10MM (ENDOMECHANICALS) ×1
SCISSORS LAP 5X35 DISP (ENDOMECHANICALS) ×2 IMPLANT
SET CHOLANGIOGRAPH 5 50 .035 (SET/KITS/TRAYS/PACK) ×2 IMPLANT
SET IRRIG TUBING LAPAROSCOPIC (IRRIGATION / IRRIGATOR) ×2 IMPLANT
SLEEVE ENDOPATH XCEL 5M (ENDOMECHANICALS) ×4 IMPLANT
SPECIMEN JAR SMALL (MISCELLANEOUS) ×2 IMPLANT
SUT VIC AB 4-0 PS2 27 (SUTURE) ×2 IMPLANT
TOWEL OR 17X24 6PK STRL BLUE (TOWEL DISPOSABLE) ×2 IMPLANT
TOWEL OR 17X26 10 PK STRL BLUE (TOWEL DISPOSABLE) ×2 IMPLANT
TRAY LAPAROSCOPIC (CUSTOM PROCEDURE TRAY) ×2 IMPLANT
TROCAR XCEL BLUNT TIP 100MML (ENDOMECHANICALS) ×2 IMPLANT
TROCAR XCEL NON-BLD 5MMX100MML (ENDOMECHANICALS) ×2 IMPLANT

## 2014-07-09 NOTE — Progress Notes (Signed)
Subjective: R back pain, some RUQ pain  Objective: Vital signs in last 24 hours: Temp:  [98.9 F (37.2 C)-99 F (37.2 C)] 98.9 F (37.2 C) (08/25 0500) Pulse Rate:  [62-86] 64 (08/25 0500) Resp:  [18] 18 (08/25 0500) BP: (114-165)/(60-72) 114/65 mmHg (08/25 0500) SpO2:  [100 %] 100 % (08/25 0500) Weight:  [190 lb 14.7 oz (86.6 kg)] 190 lb 14.7 oz (86.6 kg) (08/25 0500) Last BM Date: 07/07/14  Intake/Output from previous day: 08/24 0701 - 08/25 0700 In: 410 [P.O.:360; IV Piggyback:50] Out: -  Intake/Output this shift:    General appearance: alert and cooperative Resp: clear to auscultation bilaterally Cardio: regular rate and rhythm GI: mild tenderness RUQ  Lab Results:   Recent Labs  07/07/14 1154  WBC 7.1  HGB 12.6  HCT 38.0  PLT 249   BMET  Recent Labs  07/07/14 1154 07/09/14 0552  NA 139 140  K 4.0 4.0  CL 103 104  CO2 21 22  GLUCOSE 88 89  BUN 13 14  CREATININE 0.75 0.73  CALCIUM 9.2 9.1   PT/INR No results found for this basename: LABPROT, INR,  in the last 72 hours ABG No results found for this basename: PHART, PCO2, PO2, HCO3,  in the last 72 hours  Studies/Results: Nm Myocar Multi W/spect W/wall Motion / Ef  07/08/2014   CLINICAL DATA:  Chest pain  EXAM: Lexiscan Myovue  TECHNIQUE: The patient received IV Lexiscan .  over 15 seconds. 33.0 mCi of Technetium 13m Sestamibi injected at 30 seconds. Quantitative SPECT images were obtained in the vertical, horizontal and short axis planes after a 45 minute delay. Rest images were obtained with similar planes and delay using 10.2 mCi of Technetium 27m Sestamibi.  FINDINGS: ECG: Baseline electrocardiogram shows sinus rhythm with no ST changes.  Symptoms: The patient exercised for 7 min and 29 seconds. The patient's heart rate at rest was 77 and increased to 160 which was 94% of predicted maximal heart rate. Blood pressure at rest 129/62 and increased to 165/60. There was no chest pain and no  arrhythmias. No ST changes. Study terminated due to fatigue.  RAW Data:  Adequate image acquisition.  Quantitiative Gated SPECT EF: The gated ejection fraction was 74% and the wall motion was normal. End-diastolic volume 77 mL. End systolic volume 57 mL. TID -0.85.  Perfusion Images: The stress images reveal a small, mild intensity defect in the distal anterior wall. When compared to the rest images there was no reversibility noted.  IMPRESSION: Low risk stress nuclear study with no chest pain and no electrocardiographic changes. The scintigraphic results show probable breast attenuation but no ischemia. The gated ejection fraction was 74% and no wall motion was normal.  Olga Millers   Electronically Signed   By: Olga Millers   On: 07/08/2014 15:28   Dg Chest Port 1 View  07/07/2014   CLINICAL DATA:  Chest pain.  EXAM: PORTABLE CHEST - 1 VIEW  COMPARISON:  November 20, 2012.  FINDINGS: The heart size and mediastinal contours are within normal limits. Both lungs are clear. No pneumothorax or pleural effusion is noted. The visualized skeletal structures are unremarkable.  IMPRESSION: No acute cardiopulmonary abnormality seen.   Electronically Signed   By: Roque Lias M.D.   On: 07/07/2014 12:14   Ct Angio Chest Aortic Dissect W &/or W/o  07/07/2014   CLINICAL DATA:  Chest pain that radiates into the back and left shoulder. Headache. Question dissection.  EXAM: CT ANGIOGRAPHY CHEST  WITH CONTRAST  TECHNIQUE: Multidetector CT imaging of the chest was performed using the standard protocol during bolus administration of intravenous contrast. Multiplanar CT image reconstructions and MIPs were obtained to evaluate the vascular anatomy.  CONTRAST:  80mL OMNIPAQUE IOHEXOL 350 MG/ML SOLN  COMPARISON:  Radiographs 07/07/2014.  CT 10/01/2004.  FINDINGS: Vascular: Pre contrast images demonstrate no displaced intimal calcifications or mediastinal hematoma. Post-contrast, the thoracic aorta enhances normally. There is no  evidence of aneurysm or dissection. The pulmonary arteries are satisfactorily opacified with contrast. There is no evidence of acute pulmonary embolism. No significant atherosclerosis is seen.  Mediastinum: There are no enlarged mediastinal, hilar or axillary lymph nodes. The thyroid gland, trachea and esophagus appear normal. The heart size is normal.  Lungs/Pleura: There is no pleural or pericardial effusion.The lungs are clear aside from minimal dependent atelectasis bilaterally.  Upper abdomen: Unremarkable.  There is no adrenal mass.  Musculoskeletal/Chest wall: No chest wall lesion or acute osseous findings.  Review of the MIP images confirms the above findings.  IMPRESSION: Negative chest CTA. No evidence of aortic dissection, pulmonary embolism or other acute chest process.   Electronically Signed   By: Roxy Horseman M.D.   On: 07/07/2014 16:49   US Abdomen Limited Ruq  07/08/2014   CLINICAL DATA:  48 year old female with right upper quadrant pain. Initial encounter.  EXAM: US ABDOMEN LIMITED - RIGHT UPPER QUADRANT  COMPARISON:  Chest CTA 07/07/2014.  FINDINGS: Gallbladder:  Small gallstones, individually up to 7 mm diameter. Gallbladder wall thickness remains normal, and no pericholecystic fluid is identified, but the technologist reports a positive sonographic Murphy sign.  Common bile duct:  Diameter: 3 mm, normal  Liver:  No focal lesion identified. Within normal limits in parenchymal echogenicity. No intrahepatic biliary ductal dilatation identified.  Other findings: Negative visible right kidney. Visible right pancreas within normal limits.  IMPRESSION: 1. Positive gallstones and positive sonographic Murphy sign reported, suspicious for acute cholecystitis despite the absence of gallbladder wall thickening. 2. No evidence of biliary obstruction. These results will be called to the ordering clinician or representative by the Radiologist Assistant, and communication documented in the PACS or zVision  Dashboard.   Electronically Signed   By: Augusto Gamble M.D.   On: 07/08/2014 16:35    Anti-infectives: Anti-infectives   Start     Dose/Rate Route Frequency Ordered Stop   07/08/14 2200  cefOXitin (MEFOXIN) 1 g in dextrose 5 % 50 mL IVPB     1 g 100 mL/hr over 30 Minutes Intravenous 3 times per day 07/08/14 2045        Assessment/Plan: Cholecystitis - to OR for lap chole/IOC. Procedure, risks, and benefits discussed with her and she agrees. IV team to place new IV. On cefoxitin.  LOS: 2 days    Graciella Arment E 07/09/2014

## 2014-07-09 NOTE — Transfer of Care (Signed)
Immediate Anesthesia Transfer of Care Note  Patient: Heather Hamilton  Procedure(s) Performed: Procedure(s): LAPAROSCOPIC CHOLECYSTECTOMY (N/A)  Patient Location: PACU  Anesthesia Type:General  Level of Consciousness: lethargic and responds to stimulation  Airway & Oxygen Therapy: Patient Spontanous Breathing and Patient connected to nasal cannula oxygen  Post-op Assessment: Report given to PACU RN  Post vital signs: Reviewed and stable  Complications: No apparent anesthesia complications

## 2014-07-09 NOTE — Progress Notes (Signed)
[facility] PROGRESS NOTE  [name] JWJ:[id number] DOB: 08-05-1966 DOA: 07/07/2014 PCP: REDMON,NOELLE, PA-C  Assessment/Plan: 1. Chest/shoulder pain 1. Neg Stress 2. 2d Echo unremarkable with no WMA 3. Normal LFT's but RUQ [place] with pos murphy's sign and mult gallstones 4. Pt had localized tenderness on palpation over RUQ (see below) 2. Acute cholecystitis 1. Initial exam findings noted per above 2. General surgery was subsequently consulted and pt is now s/p lap chole 8/25 3. Stable currently post-operatively 3. HTN 1. BP stable 2. Cont home meds 4. GERD 1. Cont PPI  Code Status: Full Family Communication: Pt and husband in room Disposition Plan: Pending   Consultants:  Cardiology  General Surgery  Procedures:  Lap chole 8/25  Antibiotics:  Perioperative cefoxitin  HPI/Subjective: Reports post-op soreness over RUQ. No other complaints.  Objective: Filed Vitals:   07/09/14 1200 07/09/14 1215 07/09/14 1230 07/09/14 1300  BP: 131/66 128/69 133/66 132/71  Pulse: 86 83 82 86  Temp:      TempSrc:      Resp:      Height:      Weight:      SpO2:        Intake/Output Summary (Last 24 hours) at 07/09/14 1331 Last data filed at 07/09/14 1035  Gross per 24 hour  Intake    910 ml  Output      0 ml  Net    910 ml   Filed Weights   07/08/14 0600 07/09/14 0500  Weight: 86.637 kg (191 lb) 86.6 kg (190 lb 14.7 oz)    Exam:   General:  Awake, in nad  Cardiovascular: regular, s1, s2  Respiratory: normal resp effort, no wheezing  Abdomen: soft, pos BS, localized tenderness over RUQ  Musculoskeletal: perfused, no clubbing   Data Reviewed: Basic Metabolic Panel:  Recent Labs Lab 07/07/14 1154 07/09/14 0552  NA 139 140  K 4.0 4.0  CL 103 104  CO2 21 22  GLUCOSE 88 89  BUN 13 14  CREATININE 0.75 0.73  CALCIUM 9.2 9.1   Liver Function Tests:  Recent Labs Lab 07/08/14 0525 07/09/14 0552  AST 18 13  ALT 13 13  ALKPHOS 58 59   BILITOT 0.4 0.4  PROT 6.9 7.1  ALBUMIN 3.4* 3.4*    Recent Labs Lab 07/08/14 2230  LIPASE 35   No results found for this basename: AMMONIA,  in the last 168 hours CBC:  Recent Labs Lab 07/07/14 1154  WBC 7.1  HGB 12.6  HCT 38.0  MCV 88.0  PLT 249   Cardiac Enzymes:  Recent Labs Lab 07/07/14 1830 07/07/14 2257 07/08/14 0525  TROPONINI <0.30 <0.30 <0.30   BNP (last 3 results) No results found for this basename: PROBNP,  in the last 8760 hours CBG: No results found for this basename: GLUCAP,  in the last 168 hours  No results found for this or any previous visit (from the past 240 hour(s)).   Studies: Nm Myocar Multi W/spect W/wall Motion / Ef  07/08/2014   CLINICAL DATA:  Chest pain  EXAM: Lexiscan Myovue  TECHNIQUE: The patient received IV Lexiscan .  over 15 seconds. 33.0 mCi of Technetium 24m Sestamibi injected at 30 seconds. Quantitative SPECT images were obtained in the vertical, horizontal and short axis planes after a 45 minute delay. Rest images were obtained with similar planes and delay using 10.2 mCi of Technetium 89m Sestamibi.  FINDINGS: ECG: Baseline electrocardiogram shows sinus rhythm with no ST changes.  Symptoms: The  patient exercised for 7 min and 29 seconds. The patient's heart rate at rest was 77 and increased to 160 which was 94% of predicted maximal heart rate. Blood pressure at rest 129/62 and increased to 165/60. There was no chest pain and no arrhythmias. No ST changes. Study terminated due to fatigue.  RAW Data:  Adequate image acquisition.  Quantitiative Gated SPECT EF: The gated ejection fraction was 74% and the wall motion was normal. End-diastolic volume 77 mL. End systolic volume 57 mL. TID -0.85.  Perfusion Images: The stress images reveal a small, mild intensity defect in the distal anterior wall. When compared to the rest images there was no reversibility noted.  IMPRESSION: Low risk stress nuclear study with no chest pain and no  electrocardiographic changes. The scintigraphic results show probable breast attenuation but no ischemia. The gated ejection fraction was 74% and no wall motion was normal.  Heather Hamilton   Electronically Signed   By: Heather Hamilton   On: 07/08/2014 15:28   Ct Angio Chest Aortic Dissect W &/or W/o  07/07/2014   CLINICAL DATA:  Chest pain that radiates into the back and left shoulder. Headache. Question dissection.  EXAM: CT ANGIOGRAPHY CHEST WITH CONTRAST  TECHNIQUE: Multidetector CT imaging of the chest was performed using the standard protocol during bolus administration of intravenous contrast. Multiplanar CT image reconstructions and MIPs were obtained to evaluate the vascular anatomy.  CONTRAST:  80mL OMNIPAQUE IOHEXOL 350 MG/ML SOLN  COMPARISON:  Radiographs 07/07/2014.  CT 10/01/2004.  FINDINGS: Vascular: Pre contrast images demonstrate no displaced intimal calcifications or mediastinal hematoma. Post-contrast, the thoracic aorta enhances normally. There is no evidence of aneurysm or dissection. The pulmonary arteries are satisfactorily opacified with contrast. There is no evidence of acute pulmonary embolism. No significant atherosclerosis is seen.  Mediastinum: There are no enlarged mediastinal, hilar or axillary lymph nodes. The thyroid gland, trachea and esophagus appear normal. The heart size is normal.  Lungs/Pleura: There is no pleural or pericardial effusion.The lungs are clear aside from minimal dependent atelectasis bilaterally.  Upper abdomen: Unremarkable.  There is no adrenal mass.  Musculoskeletal/Chest wall: No chest wall lesion or acute osseous findings.  Review of the MIP images confirms the above findings.  IMPRESSION: Negative chest CTA. No evidence of aortic dissection, pulmonary embolism or other acute chest process.   Electronically Signed   By: Roxy Horseman M.D.   On: 07/07/2014 16:49   US Abdomen Limited Ruq  07/08/2014   CLINICAL DATA:  48 year old female with right upper  quadrant pain. Initial encounter.  EXAM: US ABDOMEN LIMITED - RIGHT UPPER QUADRANT  COMPARISON:  Chest CTA 07/07/2014.  FINDINGS: Gallbladder:  Small gallstones, individually up to 7 mm diameter. Gallbladder wall thickness remains normal, and no pericholecystic fluid is identified, but the technologist reports a positive sonographic Murphy sign.  Common bile duct:  Diameter: 3 mm, normal  Liver:  No focal lesion identified. Within normal limits in parenchymal echogenicity. No intrahepatic biliary ductal dilatation identified.  Other findings: Negative visible right kidney. Visible right pancreas within normal limits.  IMPRESSION: 1. Positive gallstones and positive sonographic Murphy sign reported, suspicious for acute cholecystitis despite the absence of gallbladder wall thickening. 2. No evidence of biliary obstruction. These results will be called to the ordering clinician or representative by the Radiologist Assistant, and communication documented in the PACS or zVision Dashboard.   Electronically Signed   By: Augusto Gamble M.D.   On: 07/08/2014 16:35    Scheduled Meds: .  aspirin  325 mg Oral Daily  . cefOXitin  1 g Intravenous 3 times per day  . enoxaparin (LOVENOX) injection  40 mg Subcutaneous Q24H  . furosemide  20 mg Oral Daily  . lisinopril  20 mg Oral Daily  . pantoprazole  40 mg Oral Daily  . scopolamine  1 patch Transdermal Q72H  . sodium chloride  3 mL Intravenous Q12H   Continuous Infusions: . lactated ringers 1,000 mL (07/09/14 1149)    Principal Problem:   Chest pain    Time spent:    Tenise Stetler K  [facility] Pager [phone]. If 7PM-7AM, please contact night-coverage at [email or web address], password [facility] 07/09/2014, 1:31 PM  LOS: 2 days

## 2014-07-09 NOTE — Anesthesia Procedure Notes (Signed)
Procedure Name: Intubation Date/Time: 07/09/2014 10:07 AM Performed by: De Nurse Pre-anesthesia Checklist: Emergency Drugs available, Patient identified, Suction available, Patient being monitored and Timeout performed Patient Re-evaluated:Patient Re-evaluated prior to inductionOxygen Delivery Method: Circle system utilized Preoxygenation: Pre-oxygenation with 100% oxygen Intubation Type: IV induction Ventilation: Mask ventilation without difficulty Laryngoscope Size: Mac and 3 Grade View: Grade I Tube type: Oral Tube size: 7.0 mm Number of attempts: 1 Airway Equipment and Method: Stylet Placement Confirmation: ETT inserted through vocal cords under direct vision,  positive ETCO2 and breath sounds checked- equal and bilateral Secured at: 21 cm Tube secured with: Tape Dental Injury: Teeth and Oropharynx as per pre-operative assessment

## 2014-07-09 NOTE — Anesthesia Postprocedure Evaluation (Signed)
  Anesthesia Post-op Note  Patient: Heather Hamilton  Procedure(s) Performed: Procedure(s): LAPAROSCOPIC CHOLECYSTECTOMY (N/A)  Patient Location: PACU  Anesthesia Type:General  Level of Consciousness: awake, alert  and oriented  Airway and Oxygen Therapy: Patient Spontanous Breathing and Patient connected to nasal cannula oxygen  Post-op Pain: mild  Post-op Assessment: Post-op Vital signs reviewed and Patient's Cardiovascular Status Stable  Post-op Vital Signs: Reviewed and stable  Last Vitals:  Filed Vitals:   07/09/14 1300  BP: 132/71  Pulse: 86  Temp:   Resp:     Complications: No apparent anesthesia complications

## 2014-07-09 NOTE — Progress Notes (Signed)
Stress testing was low risk study with no chest pain and no EKG changes. Echo EF 55-60% with no WMA. Please call if any questions.   Eula Listen, PA-C 07/09/2014 7:47 AM

## 2014-07-09 NOTE — Op Note (Signed)
07/07/2014 - 07/09/2014  10:50 AM  PATIENT:  Heather Hamilton  48 y.o. female  PRE-OPERATIVE DIAGNOSIS:  Cholecystitis With cholelithiasis  POST-OPERATIVE DIAGNOSIS:  Cholecystitis With cholelithiasis  PROCEDURE:  Procedure(s): LAPAROSCOPIC CHOLECYSTECTOMY  SURGEON:  Surgeon(s): Liz Malady, MD  ASSISTANTS: Ashok Norris, ANP   ANESTHESIA:   local and general  EBL:  Total I/O In: 500 [I.V.:500] Out: -   BLOOD ADMINISTERED:none  DRAINS: none   SPECIMEN:  Excision  DISPOSITION OF SPECIMEN:  PATHOLOGY  COUNTS:  YES  DICTATION: Reubin Milan Dictation Patient is brought for cholecystectomy. She received intravenous antibiotics. Informed consent was obtained. She was identified in the preop holding area. She was brought to the operating room and general endotracheal anesthesia was administered by the anesthesia staff. Her abdomen was prepped and draped in sterile fashion. We did time out procedure. An umbilical region was infiltrated with local. Informed local incision was made. Subcutaneous tissues were dissected down revealing the anterior fascia. This was divided sharply along the midline and the peritoneal cavity was entered under direct vision. Pursestring suture was placed on the fascial opening. Hassan trocar was inserted. Abdomen was insufflated with carbon monoxide in standard fashion. Under direct vision, a 5 mm epigastric and 5 mm right lateral ports x2 were placed. Local was used at the port sites. The dome the gallbladder was retracted superior medially. There were filmy omental adhesions on the body and infundibulum. These were carefully swept down. Dissection began laterally and progressed medially easily identifying the cystic duct. Dissection continued until critical view between the cystic duct, the infundibulum, the liver. In light of normal liver function tests in good anatomy, cholangiogram was deferred. 3 clips were placed proximal the cystic duct, one placed distally and  it was divided. Further dissection revealed the cystic artery. This was clipped twice proximally, once distally and divided. Gallbladder was taken off the liver bed with Bovie cautery. It was placed in an eco sac and removed from the abdomen via the infraumbilical port site. Gallbladder bed was copiously irrigated. Hemostasis was obtained with cautery. Clips remain in good position. Irrigation continued until irrigation returned clear. Irrigation was evacuated. Liver bed was dry. Ports were removed under direct vision. Pneumoperitoneum was released. Informed local fascia was closed by tying the pursestring. All 4 wounds were copiously irrigated and the skin of each was closed with running 4 Vicryl subcuticular followed by Dermabond. All counts were correct. Patient tolerated procedure well without apparent competitions taken recovery in stable condition.  PATIENT DISPOSITION:  PACU - hemodynamically stable.   Delay start of Pharmacological VTE agent (>24hrs) due to surgical blood loss or risk of bleeding:  no  Violeta Gelinas, MD, MPH, FACS Pager: [phone]  8/25/201510:50 AM

## 2014-07-09 NOTE — Anesthesia Preprocedure Evaluation (Addendum)
Anesthesia Evaluation  Patient identified by MRN, date of birth, ID band Patient awake    Reviewed: Allergy & Precautions, H&P , NPO status   Airway Mallampati: II TM Distance: >3 FB Neck ROM: Full    Dental  (+) Teeth Intact, Chipped, Caps, Dental Advisory Given,    Pulmonary          Cardiovascular hypertension, Pt. on medications Rhythm:Regular Rate:Normal     Neuro/Psych    GI/Hepatic GERD-  ,  Endo/Other    Renal/GU      Musculoskeletal   Abdominal   Peds  Hematology   Anesthesia Other Findings ECHO EF 60%  Good valves this admit.  Reproductive/Obstetrics                         Anesthesia Physical Anesthesia Plan  ASA: III  Anesthesia Plan: General   Post-op Pain Management:    Induction: Intravenous  Airway Management Planned: Oral ETT  Additional Equipment:   Intra-op Plan:   Post-operative Plan: Extubation in OR  Informed Consent: I have reviewed the patients History and Physical, chart, labs and discussed the procedure including the risks, benefits and alternatives for the proposed anesthesia with the patient or authorized representative who has indicated his/her understanding and acceptance.     Plan Discussed with:   Anesthesia Plan Comments:         Anesthesia Quick Evaluation

## 2014-07-10 ENCOUNTER — Encounter (HOSPITAL_COMMUNITY): Payer: Self-pay | Admitting: General Surgery

## 2014-07-10 DIAGNOSIS — K810 Acute cholecystitis: Secondary | ICD-10-CM

## 2014-07-10 MED ORDER — OXYCODONE HCL 5 MG PO TABS: 5.0000 mg | ORAL_TABLET | Freq: Four times a day (QID) | ORAL | Status: DC | PRN

## 2014-07-10 NOTE — Discharge Summary (Signed)
Physician Discharge Summary  Heather Hamilton RUE:[id number] DOB: 09-01-1966 DOA: 07/07/2014  PCP: Milus Height, PA-C  Admit date: 07/07/2014 Discharge date: 07/10/2014  Time spent: 35 minutes  Recommendations for Outpatient Follow-up:  1. Patient undergoing cholecystectomy during this hospitalization    Discharge Diagnoses:  Principal Problem:   Cholecystitis, acute Active Problems:   Chest pain   Discharge Condition: Stable/improved  Diet recommendation: Heart healthy diet  Filed Weights   07/08/14 0600 07/09/14 0500 07/10/14 0456  Weight: 86.637 kg (191 lb) 86.6 kg (190 lb 14.7 oz) 86 kg (189 lb 9.5 oz)    History of present illness:   Heather Hamilton is a 48 y.o. female with PMH of HTN, presented with sudden oncet substernal 10/10 chest pain radiating to back, to both arms associated with tingling, numbness, palpitations, SOB, some diaphoresis; Patient is very active at baseline, denies any exertional symptoms; denies fever, cough, no nausea, vomiting, diarrhea, no abdominal pain   Hospital Course:  Patient is a pleasant 48 year old female with a past medical history of hypertension, who was admitted to the medicine service on 07/07/2014 presenting with complaints of chest pain and back pain. She was admitted for chest pain rule out to the cardiac floor. Telemetry did not show acute changes as troponins remained within normal limits. A CT scan of lungs do not show evidence of pulmonary embolism. Stress test not show evidence of ischemia. Abdominal ultrasound did reveal presence of gallstones with a positive sonographic Murphy's sign suspicious for acute cholecystitis. General surgery was consulted. She was taken to the OR on 07/09/2014 undergoing laparoscopic cholecystectomy. She tolerated procedure well there are no immediate complications. By the following morning she reported feeling much better with a resolution to symptoms. Her diet was advanced, she was discharged in stable  condition on 07/10/2014.  Procedures:  Laparoscopic cholecystectomy performed 07/09/2014  Consultations:  Cardiology  General surgery  Discharge Exam: Filed Vitals:   07/10/14 1006  BP: 117/65  Pulse:   Temp:   Resp:     General: Patient is in no acute distress she is awake alert and oriented, states feeling well, thinks she is ready to go home today. Cardiovascular: Regular rate and rhythm normal S1-S2 no murmurs rubs or gallops Respiratory: Lungs are clear to auscultation bilaterally no wheezing rhonchi or rales Abdomen: Having some to palpation over the surgical incision sites  Discharge Instructions You were cared for by a hospitalist during your hospital stay. If you have any questions about your discharge medications or the care you received while you were in the hospital after you are discharged, you can call the unit and asked to speak with the hospitalist on call if the hospitalist that took care of you is not available. Once you are discharged, your primary care physician will handle any further medical issues. Please note that NO REFILLS for any discharge medications will be authorized once you are discharged, as it is imperative that you return to your primary care physician (or establish a relationship with a primary care physician if you do not have one) for your aftercare needs so that they can reassess your need for medications and monitor your lab values.  Discharge Instructions   Call MD for:  difficulty breathing, headache or visual disturbances    Complete by:  As directed      Call MD for:  extreme fatigue    Complete by:  As directed      Call MD for:  hives    Complete by:  As directed      Call MD for:  persistant dizziness or light-headedness    Complete by:  As directed      Call MD for:  persistant nausea and vomiting    Complete by:  As directed      Call MD for:  redness, tenderness, or signs of infection (pain, swelling, redness, odor or  green/yellow discharge around incision site)    Complete by:  As directed      Call MD for:  severe uncontrolled pain    Complete by:  As directed      Call MD for:  temperature >100.4    Complete by:  As directed      Diet - low sodium heart healthy    Complete by:  As directed      Increase activity slowly    Complete by:  As directed             Medication List         furosemide 20 MG tablet  Commonly known as:  LASIX  Take 20 mg by mouth daily.     hydrochlorothiazide 25 MG tablet  Commonly known as:  HYDRODIURIL  Take 25 mg by mouth daily.     JUNEL 1/20 1-20 MG-MCG tablet  Generic drug:  norethindrone-ethinyl estradiol  Take 1 tablet by mouth daily.     lisinopril 20 MG tablet  Commonly known as:  PRINIVIL,ZESTRIL  Take 20 mg by mouth daily.     omeprazole 20 MG capsule  Commonly known as:  PRILOSEC  Take 20 mg by mouth daily.     oxyCODONE 5 MG immediate release tablet  Commonly known as:  Oxy IR/ROXICODONE  Take 1 tablet (5 mg total) by mouth every 6 (six) hours as needed (pain).       No Known Allergies     Follow-up Information   Follow up with [facility] On 08/06/2014. (3:00pm, arrive by 2:30pm for paperwork)    Contact information:   [address]   [place] [address] [phone]       Follow up with REDMON,NOELLE, PA-C In 2 weeks.   Specialty:  Nurse Practitioner   Contact information:   [address], [address] [place] [address] [phone]        The results of significant diagnostics from this hospitalization (including imaging, microbiology, ancillary and laboratory) are listed below for reference.    Significant Diagnostic Studies: Nm Myocar Multi W/spect W/wall Motion / Ef  07/08/2014   CLINICAL DATA:  Chest pain  EXAM: Lexiscan Myovue  TECHNIQUE: The patient received IV Lexiscan .  over 15 seconds. 33.0 mCi of Technetium 61m Sestamibi injected at 30 seconds. Quantitative SPECT images were  obtained in the vertical, horizontal and short axis planes after a 45 minute delay. Rest images were obtained with similar planes and delay using 10.2 mCi of Technetium 61m Sestamibi.  FINDINGS: ECG: Baseline electrocardiogram shows sinus rhythm with no ST changes.  Symptoms: The patient exercised for 7 min and 29 seconds. The patient's heart rate at rest was 77 and increased to 160 which was 94% of predicted maximal heart rate. Blood pressure at rest 129/62 and increased to 165/60. There was no chest pain and no arrhythmias. No ST changes. Study terminated due to fatigue.  RAW Data:  Adequate image acquisition.  Quantitiative Gated SPECT EF: The gated ejection fraction was 74% and the wall motion was normal. End-diastolic  volume 77 mL. End systolic volume 57 mL. TID -0.85.  Perfusion Images: The stress images reveal a small, mild intensity defect in the distal anterior wall. When compared to the rest images there was no reversibility noted.  IMPRESSION: Low risk stress nuclear study with no chest pain and no electrocardiographic changes. The scintigraphic results show probable breast attenuation but no ischemia. The gated ejection fraction was 74% and no wall motion was normal.  Olga Millers   Electronically Signed   By: Olga Millers   On: 07/08/2014 15:28   Dg Chest Port 1 View  07/07/2014   CLINICAL DATA:  Chest pain.  EXAM: PORTABLE CHEST - 1 VIEW  COMPARISON:  November 20, 2012.  FINDINGS: The heart size and mediastinal contours are within normal limits. Both lungs are clear. No pneumothorax or pleural effusion is noted. The visualized skeletal structures are unremarkable.  IMPRESSION: No acute cardiopulmonary abnormality seen.   Electronically Signed   By: Roque Lias M.D.   On: 07/07/2014 12:14   Mm Digital Screening Bilateral  06/18/2014   CLINICAL DATA:  Screening.  EXAM: DIGITAL SCREENING BILATERAL MAMMOGRAM WITH CAD  COMPARISON:  Previous exam(s).  ACR Breast Density Category b: There are  scattered areas of fibroglandular density.  FINDINGS: There are no findings suspicious for malignancy. Images were processed with CAD.  IMPRESSION: No mammographic evidence of malignancy. A result letter of this screening mammogram will be mailed directly to the patient.  RECOMMENDATION: Screening mammogram in one year. (Code:SM-B-01Y)  BI-RADS CATEGORY  1: Negative.   Electronically Signed   By: Anselmo Pickler M.D.   On: 06/18/2014 16:56   Ct Angio Chest Aortic Dissect W &/or W/o  07/07/2014   CLINICAL DATA:  Chest pain that radiates into the back and left shoulder. Headache. Question dissection.  EXAM: CT ANGIOGRAPHY CHEST WITH CONTRAST  TECHNIQUE: Multidetector CT imaging of the chest was performed using the standard protocol during bolus administration of intravenous contrast. Multiplanar CT image reconstructions and MIPs were obtained to evaluate the vascular anatomy.  CONTRAST:  80mL OMNIPAQUE IOHEXOL 350 MG/ML SOLN  COMPARISON:  Radiographs 07/07/2014.  CT 10/01/2004.  FINDINGS: Vascular: Pre contrast images demonstrate no displaced intimal calcifications or mediastinal hematoma. Post-contrast, the thoracic aorta enhances normally. There is no evidence of aneurysm or dissection. The pulmonary arteries are satisfactorily opacified with contrast. There is no evidence of acute pulmonary embolism. No significant atherosclerosis is seen.  Mediastinum: There are no enlarged mediastinal, hilar or axillary lymph nodes. The thyroid gland, trachea and esophagus appear normal. The heart size is normal.  Lungs/Pleura: There is no pleural or pericardial effusion.The lungs are clear aside from minimal dependent atelectasis bilaterally.  Upper abdomen: Unremarkable.  There is no adrenal mass.  Musculoskeletal/Chest wall: No chest wall lesion or acute osseous findings.  Review of the MIP images confirms the above findings.  IMPRESSION: Negative chest CTA. No evidence of aortic dissection, pulmonary embolism or other acute  chest process.   Electronically Signed   By: Roxy Horseman M.D.   On: 07/07/2014 16:49   US Abdomen Limited Ruq  07/08/2014   CLINICAL DATA:  48 year old female with right upper quadrant pain. Initial encounter.  EXAM: US ABDOMEN LIMITED - RIGHT UPPER QUADRANT  COMPARISON:  Chest CTA 07/07/2014.  FINDINGS: Gallbladder:  Small gallstones, individually up to 7 mm diameter. Gallbladder wall thickness remains normal, and no pericholecystic fluid is identified, but the technologist reports a positive sonographic Murphy sign.  Common bile duct:  Diameter: 3  mm, normal  Liver:  No focal lesion identified. Within normal limits in parenchymal echogenicity. No intrahepatic biliary ductal dilatation identified.  Other findings: Negative visible right kidney. Visible right pancreas within normal limits.  IMPRESSION: 1. Positive gallstones and positive sonographic Murphy sign reported, suspicious for acute cholecystitis despite the absence of gallbladder wall thickening. 2. No evidence of biliary obstruction. These results will be called to the ordering clinician or representative by the Radiologist Assistant, and communication documented in the PACS or zVision Dashboard.   Electronically Signed   By: Augusto Gamble M.D.   On: 07/08/2014 16:35    Microbiology: No results found for this or any previous visit (from the past 240 hour(s)).   Labs: Basic Metabolic Panel:  Recent Labs Lab 07/07/14 1154 07/09/14 0552  NA 139 140  K 4.0 4.0  CL 103 104  CO2 21 22  GLUCOSE 88 89  BUN 13 14  CREATININE 0.75 0.73  CALCIUM 9.2 9.1   Liver Function Tests:  Recent Labs Lab 07/08/14 0525 07/09/14 0552  AST 18 13  ALT 13 13  ALKPHOS 58 59  BILITOT 0.4 0.4  PROT 6.9 7.1  ALBUMIN 3.4* 3.4*    Recent Labs Lab 07/08/14 2230  LIPASE 35   No results found for this basename: AMMONIA,  in the last 168 hours CBC:  Recent Labs Lab 07/07/14 1154  WBC 7.1  HGB 12.6  HCT 38.0  MCV 88.0  PLT 249   Cardiac  Enzymes:  Recent Labs Lab 07/07/14 1830 07/07/14 2257 07/08/14 0525  TROPONINI <0.30 <0.30 <0.30   BNP: BNP (last 3 results) No results found for this basename: PROBNP,  in the last 8760 hours CBG: No results found for this basename: GLUCAP,  in the last 168 hours     Signed:  Jeralyn Bennett  [facility] 07/10/2014, 11:02 AM

## 2014-07-10 NOTE — Progress Notes (Signed)
Looks good. Getting ready to go. Patient examined and I agree with the assessment and plan  Violeta Gelinas, MD, MPH, FACS Trauma: [phone] General Surgery: [phone]  07/10/2014 2:05 PM

## 2014-07-10 NOTE — Progress Notes (Signed)
Patient ID: MARVELLA JENNING, female   DOB: 02/26/1966, 48 y.o.   MRN: [id number] 1 Day Post-Op  Subjective: Pt feels well this morning except some soreness.  Tolerating clear liquids  Objective: Vital signs in last 24 hours: Temp:  [97.3 F (36.3 C)-99.3 F (37.4 C)] 98.9 F (37.2 C) (08/26 0456) Pulse Rate:  [61-86] 62 (08/26 0456) Resp:  [18-25] 18 (08/25 2030) BP: (110-133)/(65-71) 124/67 mmHg (08/26 0456) SpO2:  [94 %-97 %] 95 % (08/26 0456) Weight:  [189 lb 9.5 oz (86 kg)] 189 lb 9.5 oz (86 kg) (08/26 0456) Last BM Date: 07/07/14  Intake/Output from previous day: 08/25 0701 - 08/26 0700 In: 503 [I.V.:503] Out: 600 [Urine:600] Intake/Output this shift:    PE: Abd: soft, appropriately tender, +BS, ND, incisions are c/d/i  Lab Results:   Recent Labs  07/07/14 1154  WBC 7.1  HGB 12.6  HCT 38.0  PLT 249   BMET  Recent Labs  07/07/14 1154 07/09/14 0552  NA 139 140  K 4.0 4.0  CL 103 104  CO2 21 22  GLUCOSE 88 89  BUN 13 14  CREATININE 0.75 0.73  CALCIUM 9.2 9.1   PT/INR No results found for this basename: LABPROT, INR,  in the last 72 hours CMP     Component Value Date/Time   NA 140 07/09/2014 0552   K 4.0 07/09/2014 0552   CL 104 07/09/2014 0552   CO2 22 07/09/2014 0552   GLUCOSE 89 07/09/2014 0552   BUN 14 07/09/2014 0552   CREATININE 0.73 07/09/2014 0552   CALCIUM 9.1 07/09/2014 0552   PROT 7.1 07/09/2014 0552   ALBUMIN 3.4* 07/09/2014 0552   AST 13 07/09/2014 0552   ALT 13 07/09/2014 0552   ALKPHOS 59 07/09/2014 0552   BILITOT 0.4 07/09/2014 0552   GFRNONAA >90 07/09/2014 0552   GFRAA >90 07/09/2014 0552   Lipase     Component Value Date/Time   LIPASE 35 07/08/2014 2230       Studies/Results: Nm Myocar Multi W/spect W/wall Motion / Ef  07/08/2014   CLINICAL DATA:  Chest pain  EXAM: Lexiscan Myovue  TECHNIQUE: The patient received IV Lexiscan .  over 15 seconds. 33.0 mCi of Technetium 89m Sestamibi injected at 30 seconds. Quantitative SPECT images  were obtained in the vertical, horizontal and short axis planes after a 45 minute delay. Rest images were obtained with similar planes and delay using 10.2 mCi of Technetium 40m Sestamibi.  FINDINGS: ECG: Baseline electrocardiogram shows sinus rhythm with no ST changes.  Symptoms: The patient exercised for 7 min and 29 seconds. The patient's heart rate at rest was 77 and increased to 160 which was 94% of predicted maximal heart rate. Blood pressure at rest 129/62 and increased to 165/60. There was no chest pain and no arrhythmias. No ST changes. Study terminated due to fatigue.  RAW Data:  Adequate image acquisition.  Quantitiative Gated SPECT EF: The gated ejection fraction was 74% and the wall motion was normal. End-diastolic volume 77 mL. End systolic volume 57 mL. TID -0.85.  Perfusion Images: The stress images reveal a small, mild intensity defect in the distal anterior wall. When compared to the rest images there was no reversibility noted.  IMPRESSION: Low risk stress nuclear study with no chest pain and no electrocardiographic changes. The scintigraphic results show probable breast attenuation but no ischemia. The gated ejection fraction was 74% and no wall motion was normal.  Olga Millers   Electronically Signed   By:  Olga Millers   On: 07/08/2014 15:28   US Abdomen Limited Ruq  07/08/2014   CLINICAL DATA:  48 year old female with right upper quadrant pain. Initial encounter.  EXAM: US ABDOMEN LIMITED - RIGHT UPPER QUADRANT  COMPARISON:  Chest CTA 07/07/2014.  FINDINGS: Gallbladder:  Small gallstones, individually up to 7 mm diameter. Gallbladder wall thickness remains normal, and no pericholecystic fluid is identified, but the technologist reports a positive sonographic Murphy sign.  Common bile duct:  Diameter: 3 mm, normal  Liver:  No focal lesion identified. Within normal limits in parenchymal echogenicity. No intrahepatic biliary ductal dilatation identified.  Other findings: Negative visible  right kidney. Visible right pancreas within normal limits.  IMPRESSION: 1. Positive gallstones and positive sonographic Murphy sign reported, suspicious for acute cholecystitis despite the absence of gallbladder wall thickening. 2. No evidence of biliary obstruction. These results will be called to the ordering clinician or representative by the Radiologist Assistant, and communication documented in the PACS or zVision Dashboard.   Electronically Signed   By: Augusto Gamble M.D.   On: 07/08/2014 16:35    Anti-infectives: Anti-infectives   Start     Dose/Rate Route Frequency Ordered Stop   07/08/14 2200  cefOXitin (MEFOXIN) 1 g in dextrose 5 % 50 mL IVPB     1 g 100 mL/hr over 30 Minutes Intravenous 3 times per day 07/08/14 2045         Assessment/Plan  1. POD 1, s/p lap chole  Plan: 1.  Patient is doing well post op.  Ok for [employer] home from our standpoint if she tolerates her solid diet lunch.  I have spoken with her about discharge instructions and care.  She understands.  Follow up has been made.  LOS: 3 days    Korey Arroyo E 07/10/2014, 8:21 AM Pager: [phone]

## 2014-07-10 NOTE — Discharge Instructions (Signed)
[facility] ______[facility] °LAPAROSCOPIC SURGERY: POST OP INSTRUCTIONS °Always review your discharge instruction sheet given to you by the facility where your surgery was performed. °IF YOU HAVE DISABILITY OR FAMILY LEAVE FORMS, YOU MUST BRING THEM TO THE OFFICE FOR PROCESSING.   °DO NOT GIVE THEM TO YOUR DOCTOR. ° °1. A prescription for pain medication may be given to you upon discharge.  Take your pain medication as prescribed, if needed.  If narcotic pain medicine is not needed, then you may take acetaminophen (Tylenol) or ibuprofen (Advil) as needed. °2. Take your usually prescribed medications unless otherwise directed. °3. If you need a refill on your pain medication, please contact your pharmacy.  They will contact our office to request authorization. Prescriptions will not be filled after 5pm or on week-ends. °4. You should follow a light diet the first few days after arrival home, such as soup and crackers, etc.  Be sure to include lots of fluids daily. °5. Most patients will experience some swelling and bruising in the area of the incisions.  Ice packs will help.  Swelling and bruising can take several days to resolve.  °6. It is common to experience some constipation if taking pain medication after surgery.  Increasing fluid intake and taking a stool softener (such as Colace) will usually help or prevent this problem from occurring.  A mild laxative (Milk of Magnesia or Miralax) should be taken according to package instructions if there are no bowel movements after 48 hours. °7. Unless discharge instructions indicate otherwise, you may remove your bandages 24-48 hours after surgery, and you may shower at that time.  You may have steri-strips (small skin tapes) in place directly over the incision.  These strips should be left on the skin for 7-10 days.  If your surgeon used skin glue on the incision, you may shower in 24 hours.  The glue will flake off over the next 2-3 weeks.  Any sutures or  staples will be removed at the office during your follow-up visit. °8. ACTIVITIES:  You may resume regular (light) daily activities beginning the next day--such as daily self-care, walking, climbing stairs--gradually increasing activities as tolerated.  You may have sexual intercourse when it is comfortable.  Refrain from any heavy lifting or straining until approved by your doctor. °a. You may drive when you are no longer taking prescription pain medication, you can comfortably wear a seatbelt, and you can safely maneuver your car and apply brakes. °b. RETURN TO WORK:  __________________________________________________________ °9. You should see your doctor in the office for a follow-up appointment approximately 2-3 weeks after your surgery.  Make sure that you call for this appointment within a day or two after you arrive home to insure a convenient appointment time. °10. OTHER INSTRUCTIONS: __________________________________________________________________________________________________________________________ __________________________________________________________________________________________________________________________ °WHEN TO CALL YOUR DOCTOR: °1. Fever over 101.0 °2. Inability to urinate °3. Continued bleeding from incision. °4. Increased pain, redness, or drainage from the incision. °5. Increasing abdominal pain ° °The clinic staff is available to answer your questions during regular business hours.  Please don’t hesitate to call and ask to speak to one of the nurses for clinical concerns.  If you have a medical emergency, go to the nearest emergency room or call 911.  A surgeon from [facility] is always on call at the hospital. °[address], [address], [place], [place]  [address] ? [address] [address], [place],    [address] °[phone] ? [phone] ? FAX [phone] °Web site:   [email or web address]  Low-Fat Diet for Pancreatitis or Gallbladder Conditions A  low-fat diet can be helpful if you have pancreatitis or a gallbladder condition. With these conditions, your pancreas and gallbladder have trouble digesting fats. A healthy eating plan with less fat will help rest your pancreas and gallbladder and reduce your symptoms. WHAT DO I NEED TO KNOW ABOUT THIS DIET?  Eat a low-fat diet.  Reduce your fat intake to less than 20-30% of your total daily calories. This is less than 50-60 g of fat per day.  Remember that you need some fat in your diet. Ask your dietician what your daily goal should be.  Choose nonfat and low-fat healthy foods. Look for the words "nonfat," "low fat," or "fat free."  As a guide, look on the label and choose foods with less than 3 g of fat per serving. Eat only one serving.  Avoid alcohol.  Do not smoke. If you need help quitting, talk with your health care provider.  Eat small frequent meals instead of three large heavy meals. WHAT FOODS CAN I EAT? Grains Include healthy grains and starches such as potatoes, wheat bread, fiber-rich cereal, and brown rice. Choose whole grain options whenever possible. In adults, whole grains should account for 45-65% of your daily calories.  Fruits and Vegetables Eat plenty of fruits and vegetables. Fresh fruits and vegetables add fiber to your diet. Meats and Other Protein Sources Eat lean meat such as chicken and pork. Trim any fat off of meat before cooking it. Eggs, fish, and beans are other sources of protein. In adults, these foods should account for 10-35% of your daily calories. Dairy Choose low-fat milk and dairy options. Dairy includes fat and protein, as well as calcium.  Fats and Oils Limit high-fat foods such as fried foods, sweets, baked goods, sugary drinks.  Other Creamy sauces and condiments, such as mayonnaise, can add extra fat. Think about whether or not you need to use them, or use smaller amounts or low fat options. WHAT FOODS ARE NOT RECOMMENDED?  High fat  foods, such as:  [employer].  Ice cream.  [place] toast.  Sweet rolls.  Pizza.  Cheese bread.  Foods covered with batter, butter, creamy sauces, or cheese.  Fried foods.  Sugary drinks and desserts.  Foods that cause gas or bloating Document Released: 11/06/2013 Document Reviewed: 11/06/2013 [facility] Patient Information 2015 [place], [place]. This information is not intended to replace advice given to you by your health care provider. Make sure you discuss any questions you have with your health care provider.

## 2014-08-06 ENCOUNTER — Encounter (INDEPENDENT_AMBULATORY_CARE_PROVIDER_SITE_OTHER): Payer: 59

## 2015-03-30 ENCOUNTER — Emergency Department (HOSPITAL_COMMUNITY)
Admission: EM | Admit: 2015-03-30 | Discharge: 2015-03-30 | Disposition: A | Discharge status: Home / Self Care | Payer: 59 | Attending: Emergency Medicine | Admitting: Emergency Medicine

## 2015-03-30 ENCOUNTER — Encounter (HOSPITAL_COMMUNITY): Payer: Self-pay | Admitting: Emergency Medicine

## 2015-03-30 ENCOUNTER — Emergency Department (HOSPITAL_COMMUNITY): Payer: 59

## 2015-03-30 DIAGNOSIS — Z793 Long term (current) use of hormonal contraceptives: Secondary | ICD-10-CM | POA: Insufficient documentation

## 2015-03-30 DIAGNOSIS — Y9301 Activity, walking, marching and hiking: Secondary | ICD-10-CM | POA: Diagnosis not present

## 2015-03-30 DIAGNOSIS — K219 Gastro-esophageal reflux disease without esophagitis: Secondary | ICD-10-CM | POA: Insufficient documentation

## 2015-03-30 DIAGNOSIS — W1839XA Other fall on same level, initial encounter: Secondary | ICD-10-CM | POA: Insufficient documentation

## 2015-03-30 DIAGNOSIS — S3992XA Unspecified injury of lower back, initial encounter: Secondary | ICD-10-CM | POA: Insufficient documentation

## 2015-03-30 DIAGNOSIS — I10 Essential (primary) hypertension: Secondary | ICD-10-CM | POA: Insufficient documentation

## 2015-03-30 DIAGNOSIS — Y990 Civilian activity done for income or pay: Secondary | ICD-10-CM | POA: Diagnosis not present

## 2015-03-30 DIAGNOSIS — S79911A Unspecified injury of right hip, initial encounter: Secondary | ICD-10-CM | POA: Diagnosis not present

## 2015-03-30 DIAGNOSIS — S80211A Abrasion, right knee, initial encounter: Secondary | ICD-10-CM | POA: Diagnosis not present

## 2015-03-30 DIAGNOSIS — Y9289 Other specified places as the place of occurrence of the external cause: Secondary | ICD-10-CM | POA: Diagnosis not present

## 2015-03-30 DIAGNOSIS — Z79899 Other long term (current) drug therapy: Secondary | ICD-10-CM | POA: Insufficient documentation

## 2015-03-30 DIAGNOSIS — W19XXXA Unspecified fall, initial encounter: Secondary | ICD-10-CM

## 2015-03-30 MED ORDER — NAPROXEN 500 MG PO TABS: 500.0000 mg | ORAL_TABLET | Freq: Two times a day (BID) | ORAL | Status: DC | PRN

## 2015-03-30 MED ORDER — IBUPROFEN 800 MG PO TABS
800.0000 mg | ORAL_TABLET | Freq: Once | ORAL | Status: AC
  Administered 2015-03-30: 800 mg via ORAL
  Filled 2015-03-30: qty 1

## 2015-03-30 MED ORDER — METHOCARBAMOL 500 MG PO TABS: 500.0000 mg | ORAL_TABLET | Freq: Two times a day (BID) | ORAL | Status: DC

## 2015-03-30 NOTE — ED Provider Notes (Signed)
CSN: [id number]     Arrival date & time 03/30/15  1308 History   First MD Initiated Contact with Patient 03/30/15 1341     Chief Complaint  Patient presents with  . Fall     (Consider location/radiation/quality/duration/timing/severity/associated sxs/prior Treatment) HPI   49 year old female presents to the ER via EMS from work for evaluation of a fall. Patient was walking to work, when she stepped onto a broken crate, fell and landed on her right side against concrete. She denies hitting head or loss of consciousness but she reports acute onset of pain to her low back and her right hip as well as her right anterior knee. Pain is worsened with movement, 6 out of 10, nonradiating. There is no associated headache, neck pain, chest pain, abdominal pain, numbness or weakness. No specific treatment tried prior to arrival. There was bystander who encouraged patient not to move so therefore she has not ambulated yet. EMS placed patient on spine board and neck brace. She is not on any blood thinner medication. No precipitating symptoms prior to fall.  Past Medical History  Diagnosis Date  . Hypertension   . GERD (gastroesophageal reflux disease)    Past Surgical History  Procedure Laterality Date  . Cesarean section    . Cholecystectomy N/A 07/09/2014    Procedure: LAPAROSCOPIC CHOLECYSTECTOMY;  Surgeon: Liz MaladyBurke E Thompson, MD;  Location: [facility] OR;  Service: General;  Laterality: N/A;   No family history on file. History  Substance Use Topics  . Smoking status: Never Smoker   . Smokeless tobacco: Never Used  . Alcohol Use: No   OB History    No data available     Review of Systems  Constitutional: Negative for fever.  Musculoskeletal: Positive for back pain.  Skin: Positive for wound.  Neurological: Negative for headaches.      Allergies  Codeine  Home Medications   Prior to Admission medications   Medication Sig Start Date End Date Taking? Authorizing Provider  furosemide  (LASIX) 20 MG tablet Take 20 mg by mouth daily. 06/10/14   Historical Provider, MD  hydrochlorothiazide (HYDRODIURIL) 25 MG tablet Take 25 mg by mouth daily.    Historical Provider, MD  lisinopril (PRINIVIL,ZESTRIL) 20 MG tablet Take 20 mg by mouth daily.    Historical Provider, MD  norethindrone-ethinyl estradiol (JUNEL 1/20) 1-20 MG-MCG tablet Take 1 tablet by mouth daily.    Historical Provider, MD  omeprazole (PRILOSEC) 20 MG capsule Take 20 mg by mouth daily.    Historical Provider, MD  oxyCODONE (OXY IR/ROXICODONE) 5 MG immediate release tablet Take 1 tablet (5 mg total) by mouth every 6 (six) hours as needed (pain). 07/10/14   Jeralyn BennettEzequiel Zamora, MD   BP 119/76 mmHg  Pulse 76  Temp(Src) 98 F (36.7 C) (Oral)  Resp 16  SpO2 96%  LMP 06/16/2014 Physical Exam  Constitutional: She appears well-developed and well-nourished. No distress.  HENT:  Head: Atraumatic.  Eyes: Conjunctivae are normal.  Neck: Neck supple.  C-Collar in place  Musculoskeletal: She exhibits tenderness (Tenderness to L spine and right paraspinal muscle on palpation without overlying skin changes, crepitus or deformity. Right hip with full range of motion. Tenderness to lateral aspects of right hip.).  Neurological: She is alert.  Skin: No rash noted.  Skin abrasion noted to anterior right knee with full range of motion and mild tenderness.  Psychiatric: She has a normal mood and affect.  Nursing note and vitals reviewed.   ED Course  Procedures (including  critical care time)  Patient had a mechanical fall and injuring her low back right hip and right knee. No neck injury, C-collar removed.  X-ray of her low back shows no acute fractures or dislocation. Her other injury is minor based on exam. R hip with FROM, no significant injury. Patient received ibuprofen and symptoms improved. She was able to ambulate without assist. Reassurance given. Rice therapy discussed. Outpatient follow-up as needed. Return precautions  discussed.  Labs Review Labs Reviewed - No data to display  Imaging Review Dg Lumbar Spine Complete  03/30/2015   CLINICAL DATA:  Lumbar pain left greater than right post fall today.  EXAM: LUMBAR SPINE - COMPLETE 4+ VIEW  COMPARISON:  CT 08/22/2012  FINDINGS: Vertebral body alignment and heights are within normal. There is mild spondylosis throughout the lumbar spine to include facet arthropathy over the mid to lower lumbar spine. There is disc space narrowing at the L5-S1 level unchanged. No compression fracture or subluxation.  IMPRESSION: Mild spondylosis of the lumbar spine. Degenerative disc disease at the L5-S1 level.   Electronically Signed   By: Elberta Fortisaniel  Boyle M.D.   On: 03/30/2015 14:15     EKG Interpretation None      MDM   Final diagnoses:  Fall    BP 115/78 mmHg  Pulse 67  Temp(Src) 98 F (36.7 C) (Oral)  Resp 18  SpO2 98%  LMP 06/16/2014     Fayrene HelperBowie Nael Petrosyan, PA-C 03/30/15 1457  Purvis SheffieldForrest Harrison, MD 03/31/15 1606

## 2015-03-30 NOTE — ED Notes (Signed)
Pt was walking into work, fell outside on the sidewalk injuring her R hip, R arm, R lower back and R leg. Pt denies hitting head or LOC. Pt denies other injury and is A&O x4. Pt in NAD

## 2015-03-30 NOTE — ED Notes (Signed)
Pt ambulated from room to bathroom with no assist. Nurse aware.

## 2015-03-30 NOTE — Discharge Instructions (Signed)
RICE: Routine Care for Injuries The routine care of many injuries includes Rest, Ice, Compression, and Elevation (RICE). HOME CARE INSTRUCTIONS  Rest is needed to allow your body to heal. Routine activities can usually be resumed when comfortable. Injured tendons and bones can take up to 6 weeks to heal. Tendons are the cord-like structures that attach muscle to bone.  Ice following an injury helps keep the swelling down and reduces pain.  Put ice in a plastic bag.  Place a towel between your skin and the bag.  Leave the ice on for 15-20 minutes, 3-4 times a day, or as directed by your health care provider. Do this while awake, for the first 24 to 48 hours. After that, continue as directed by your caregiver.  Compression helps keep swelling down. It also gives support and helps with discomfort. If an elastic bandage has been applied, it should be removed and reapplied every 3 to 4 hours. It should not be applied tightly, but firmly enough to keep swelling down. Watch fingers or toes for swelling, bluish discoloration, coldness, numbness, or excessive pain. If any of these problems occur, remove the bandage and reapply loosely. Contact your caregiver if these problems continue.  Elevation helps reduce swelling and decreases pain. With extremities, such as the arms, hands, legs, and feet, the injured area should be placed near or above the level of the heart, if possible. SEEK IMMEDIATE MEDICAL CARE IF:  You have persistent pain and swelling.  You develop redness, numbness, or unexpected weakness.  Your symptoms are getting worse rather than improving after several days. These symptoms may indicate that further evaluation or further X-rays are needed. Sometimes, X-rays may not show a small broken bone (fracture) until 1 week or 10 days later. Make a follow-up appointment with your caregiver. Ask when your X-ray results will be ready. Make sure you get your X-ray results. Document Released:  02/13/2001 Document Revised: 11/06/2013 Document Reviewed: 04/02/2011 [employer] Patient Information 2015 [employer]. This information is not intended to replace advice given to you by your health care provider. Make sure you discuss any questions you have with your health care provider.  

## 2015-03-30 NOTE — ED Notes (Signed)
Questions, concerns denied r/t dc. Pt ambulatory and a&ox4 

## 2015-03-30 NOTE — ED Notes (Signed)
EMS contacted d/t patient fall. Patient was looking at another person who had fallen and fell herself onto the ground landing on her right side. Pt c/o pain in her lower back, right hip and knee 10/10 with movement. Hx of HTN on lisinopril and lasix.

## 2015-03-30 NOTE — ED Notes (Addendum)
Pt removed from back board by 2 RN's and 1 EMS personnel. Pt only c/o lower back pain upon palpation. Pt still has neck brace in place from EMS

## 2015-03-31 ENCOUNTER — Other Ambulatory Visit: Payer: Self-pay | Admitting: Physician Assistant

## 2015-03-31 ENCOUNTER — Ambulatory Visit
Admission: RE | Admit: 2015-03-31 | Discharge: 2015-03-31 | Disposition: A | Discharge status: Home / Self Care | Payer: Worker's Compensation | Source: Ambulatory Visit | Attending: Physician Assistant | Admitting: Physician Assistant

## 2015-03-31 DIAGNOSIS — W19XXXA Unspecified fall, initial encounter: Secondary | ICD-10-CM

## 2015-05-02 ENCOUNTER — Emergency Department (HOSPITAL_COMMUNITY): Payer: 59

## 2015-05-02 ENCOUNTER — Inpatient Hospital Stay (HOSPITAL_COMMUNITY)
Admission: EM | Admit: 2015-05-02 | Discharge: 2015-05-06 | DRG: 131 | Disposition: A | Discharge status: Home / Self Care | Payer: 59 | Attending: Otolaryngology | Admitting: Otolaryngology

## 2015-05-02 ENCOUNTER — Encounter (HOSPITAL_COMMUNITY): Payer: Self-pay

## 2015-05-02 DIAGNOSIS — W1839XA Other fall on same level, initial encounter: Secondary | ICD-10-CM | POA: Diagnosis present

## 2015-05-02 DIAGNOSIS — M542 Cervicalgia: Secondary | ICD-10-CM | POA: Diagnosis not present

## 2015-05-02 DIAGNOSIS — M264 Malocclusion, unspecified: Secondary | ICD-10-CM | POA: Diagnosis present

## 2015-05-02 DIAGNOSIS — R55 Syncope and collapse: Secondary | ICD-10-CM | POA: Diagnosis not present

## 2015-05-02 DIAGNOSIS — I10 Essential (primary) hypertension: Secondary | ICD-10-CM | POA: Diagnosis present

## 2015-05-02 DIAGNOSIS — Y92002 Bathroom of unspecified non-institutional (private) residence single-family (private) house as the place of occurrence of the external cause: Secondary | ICD-10-CM

## 2015-05-02 DIAGNOSIS — D62 Acute posthemorrhagic anemia: Secondary | ICD-10-CM | POA: Diagnosis not present

## 2015-05-02 DIAGNOSIS — S14126A Central cord syndrome at C6 level of cervical spinal cord, initial encounter: Secondary | ICD-10-CM | POA: Diagnosis present

## 2015-05-02 DIAGNOSIS — R200 Anesthesia of skin: Secondary | ICD-10-CM | POA: Diagnosis present

## 2015-05-02 DIAGNOSIS — R319 Hematuria, unspecified: Secondary | ICD-10-CM | POA: Diagnosis present

## 2015-05-02 DIAGNOSIS — E669 Obesity, unspecified: Secondary | ICD-10-CM | POA: Diagnosis present

## 2015-05-02 DIAGNOSIS — W19XXXA Unspecified fall, initial encounter: Secondary | ICD-10-CM | POA: Diagnosis present

## 2015-05-02 DIAGNOSIS — S01511A Laceration without foreign body of lip, initial encounter: Secondary | ICD-10-CM | POA: Diagnosis present

## 2015-05-02 DIAGNOSIS — M4802 Spinal stenosis, cervical region: Secondary | ICD-10-CM | POA: Diagnosis present

## 2015-05-02 DIAGNOSIS — S02411A LeFort I fracture, initial encounter for closed fracture: Secondary | ICD-10-CM | POA: Diagnosis present

## 2015-05-02 DIAGNOSIS — S025XXA Fracture of tooth (traumatic), initial encounter for closed fracture: Secondary | ICD-10-CM | POA: Diagnosis present

## 2015-05-02 DIAGNOSIS — S0181XA Laceration without foreign body of other part of head, initial encounter: Secondary | ICD-10-CM | POA: Diagnosis present

## 2015-05-02 DIAGNOSIS — Z885 Allergy status to narcotic agent status: Secondary | ICD-10-CM | POA: Diagnosis not present

## 2015-05-02 DIAGNOSIS — S0993XA Unspecified injury of face, initial encounter: Secondary | ICD-10-CM

## 2015-05-02 DIAGNOSIS — I951 Orthostatic hypotension: Secondary | ICD-10-CM | POA: Diagnosis present

## 2015-05-02 DIAGNOSIS — S14129A Central cord syndrome at unspecified level of cervical spinal cord, initial encounter: Secondary | ICD-10-CM | POA: Diagnosis present

## 2015-05-02 DIAGNOSIS — E860 Dehydration: Secondary | ICD-10-CM | POA: Diagnosis present

## 2015-05-02 DIAGNOSIS — I5032 Chronic diastolic (congestive) heart failure: Secondary | ICD-10-CM | POA: Diagnosis present

## 2015-05-02 DIAGNOSIS — M47812 Spondylosis without myelopathy or radiculopathy, cervical region: Secondary | ICD-10-CM | POA: Diagnosis present

## 2015-05-02 DIAGNOSIS — R52 Pain, unspecified: Secondary | ICD-10-CM

## 2015-05-02 DIAGNOSIS — Z6833 Body mass index (BMI) 33.0-33.9, adult: Secondary | ICD-10-CM | POA: Diagnosis not present

## 2015-05-02 DIAGNOSIS — K219 Gastro-esophageal reflux disease without esophagitis: Secondary | ICD-10-CM | POA: Diagnosis present

## 2015-05-02 LAB — CBC WITH DIFFERENTIAL/PLATELET
Basophils Absolute: 0 10*3/uL (ref 0.0–0.1)
Basophils Relative: 0 % (ref 0–1)
Eosinophils Absolute: 0 10*3/uL (ref 0.0–0.7)
Eosinophils Relative: 0 % (ref 0–5)
HEMATOCRIT: 37.4 % (ref 36.0–46.0)
Hemoglobin: 12.4 g/dL (ref 12.0–15.0)
LYMPHS PCT: 15 % (ref 12–46)
Lymphs Abs: 1.1 10*3/uL (ref 0.7–4.0)
MCH: 29 pg (ref 26.0–34.0)
MCHC: 33.2 g/dL (ref 30.0–36.0)
MCV: 87.4 fL (ref 78.0–100.0)
MONO ABS: 0.3 10*3/uL (ref 0.1–1.0)
Monocytes Relative: 5 % (ref 3–12)
Neutro Abs: 5.7 10*3/uL (ref 1.7–7.7)
Neutrophils Relative %: 80 % — ABNORMAL HIGH (ref 43–77)
Platelets: 154 10*3/uL (ref 150–400)
RBC: 4.28 MIL/uL (ref 3.87–5.11)
RDW: 14.5 % (ref 11.5–15.5)
WBC: 7.1 10*3/uL (ref 4.0–10.5)

## 2015-05-02 LAB — BASIC METABOLIC PANEL
ANION GAP: 14 (ref 5–15)
BUN: 15 mg/dL (ref 6–20)
CHLORIDE: 104 mmol/L (ref 101–111)
CO2: 20 mmol/L — ABNORMAL LOW (ref 22–32)
Calcium: 9 mg/dL (ref 8.9–10.3)
Creatinine, Ser: 0.85 mg/dL (ref 0.44–1.00)
GFR calc Af Amer: >60 mL/min (ref >60)
GFR calc non Af Amer: >60 mL/min (ref >60)
Glucose, Bld: 90 mg/dL (ref 65–99)
Potassium: 4.1 mmol/L (ref 3.5–5.1)
SODIUM: 138 mmol/L (ref 135–145)

## 2015-05-02 MED ORDER — HYDROMORPHONE HCL 1 MG/ML IJ SOLN
0.5000 mg | Freq: Once | INTRAMUSCULAR | Status: AC
  Administered 2015-05-02: 0.5 mg via INTRAVENOUS
  Filled 2015-05-02: qty 1

## 2015-05-02 MED ORDER — ONDANSETRON HCL 4 MG/2ML IJ SOLN
4.0000 mg | Freq: Once | INTRAMUSCULAR | Status: AC
  Administered 2015-05-02: 4 mg via INTRAVENOUS
  Filled 2015-05-02: qty 2

## 2015-05-02 MED ORDER — BUPIVACAINE HCL (PF) 0.5 % IJ SOLN
10.0000 mL | Freq: Once | INTRAMUSCULAR | Status: DC
  Filled 2015-05-02: qty 10

## 2015-05-02 MED ORDER — HYDROMORPHONE HCL 1 MG/ML IJ SOLN
1.0000 mg | INTRAMUSCULAR | Status: DC | PRN
  Administered 2015-05-02 – 2015-05-05 (×14): 1 mg via INTRAVENOUS
  Filled 2015-05-02 (×14): qty 1

## 2015-05-02 MED ORDER — PREGABALIN 75 MG PO CAPS
75.0000 mg | ORAL_CAPSULE | Freq: Two times a day (BID) | ORAL | Status: DC
  Administered 2015-05-02 – 2015-05-06 (×8): 75 mg via ORAL
  Filled 2015-05-02 (×8): qty 1

## 2015-05-02 MED ORDER — DEXTROSE-NACL 5-0.9 % IV SOLN
INTRAVENOUS | Status: DC
  Administered 2015-05-04 – 2015-05-05 (×3): via INTRAVENOUS

## 2015-05-02 MED ORDER — SODIUM CHLORIDE 0.9 % IV SOLN
INTRAVENOUS | Status: AC
  Administered 2015-05-02 – 2015-05-03 (×2): via INTRAVENOUS

## 2015-05-02 MED ORDER — ACETAMINOPHEN 325 MG PO TABS: 650.0000 mg | ORAL_TABLET | ORAL | Status: DC | PRN

## 2015-05-02 MED ORDER — ONDANSETRON HCL 4 MG PO TABS: 4.0000 mg | ORAL_TABLET | Freq: Four times a day (QID) | ORAL | Status: DC | PRN

## 2015-05-02 MED ORDER — BUPIVACAINE HCL 0.25 % IJ SOLN
15.0000 mL | Freq: Once | INTRAMUSCULAR | Status: DC
  Filled 2015-05-02: qty 15

## 2015-05-02 MED ORDER — ONDANSETRON HCL 4 MG/2ML IJ SOLN
4.0000 mg | Freq: Four times a day (QID) | INTRAMUSCULAR | Status: DC | PRN
  Administered 2015-05-02 – 2015-05-04 (×3): 4 mg via INTRAVENOUS
  Filled 2015-05-02 (×3): qty 2

## 2015-05-02 MED ORDER — ENOXAPARIN SODIUM 40 MG/0.4ML ~~LOC~~ SOLN
40.0000 mg | SUBCUTANEOUS | Status: DC
  Administered 2015-05-02 – 2015-05-05 (×3): 40 mg via SUBCUTANEOUS
  Filled 2015-05-02 (×3): qty 0.4

## 2015-05-02 MED ORDER — BUPIVACAINE HCL (PF) 0.25 % IJ SOLN
10.0000 mL | Freq: Once | INTRAMUSCULAR | Status: AC
  Administered 2015-05-02: 10 mL
  Filled 2015-05-02: qty 10

## 2015-05-02 MED ORDER — OXYCODONE HCL 5 MG PO TABS
5.0000 mg | ORAL_TABLET | ORAL | Status: DC | PRN
  Administered 2015-05-04: 10 mg via ORAL
  Filled 2015-05-02: qty 2

## 2015-05-02 MED ORDER — LIDOCAINE-EPINEPHRINE-TETRACAINE (LET) SOLUTION
3.0000 mL | Freq: Once | NASAL | Status: AC
  Administered 2015-05-02: 3 mL via TOPICAL
  Filled 2015-05-02: qty 3

## 2015-05-02 MED ORDER — LORAZEPAM 2 MG/ML IJ SOLN
1.0000 mg | Freq: Once | INTRAMUSCULAR | Status: AC
  Administered 2015-05-02: 1 mg via INTRAVENOUS
  Filled 2015-05-02: qty 1

## 2015-05-02 NOTE — ED Notes (Signed)
Trauma Surgeon at bedside

## 2015-05-02 NOTE — ED Notes (Signed)
PA at bedside.

## 2015-05-02 NOTE — ED Notes (Signed)
Pt at MRI

## 2015-05-02 NOTE — Consult Note (Signed)
[facility] Medical Consultation  Heather Hamilton NWG:[id number] DOB: 20-Aug-1966 DOA: 05/02/2015 PCP: Gardenia Phlegm   Requesting physician: Dr. Leonard Schwartz. Janee Morn Date of consultation: 05/02/2015 Reason for consultation: Syncope  Impression/Recommendations Principal Problem:   Cervical spinal stenosis Active Problems:   Syncope   Fall   LeFort I fracture   Central cord syndrome   Hypertension   Syncope Patient takes Lasix and is prone to dehydration.  Has had dizzy episodes in the past without syncope. She has also been on a low carb diet for 3 weeks and has been under tremendous stress in her personal life. No medication changes.  No palpitations or CP.  CT Head is negative.  Exam is non focal.   Episode was unwitnessed and the patient does not know how long she was unconscious.  Does not appear to be a vasovagal episode.  Patient is unable to have ortho static vitals checked due to her current spinal cord injury.    Will give gentle IV hydration for 24 hours.  Check a 2D echo.  Monitor on Tele. Check U/A and UDS.   May have an arrhythmia - will repeat an EKG in the morning on 6/18.  Recommend changing lasix to prn on discharge.   Follow up with PCP to monitor BP.  If she has frequent dizzy spell would recommend an event monitor.   Facial fracture across the base of the maxillary sinuses and maxilla Management per Trauma Surgery   Cervical spondylosis C4-C5. Neuro Surgery to evaluate.  Dr. Lovell Sheehan was consulted by Trauma Surgery    HTN BP currently normal.  Agree with holding HTN medications as primary service has done.   [facility] will followup again tomorrow. Please contact me if I can be of assistance in the meanwhile. Thank you for this consultation.  Chief Complaint: Syncope  HPI:  Heather Hamilton is a 48 year old female with a past mental history of grade 1 diastolic dysfunction, hypertension and GERD. She is brought to the emergency department today after suffering a  syncopal episode and fall in the shower. She is being admitted by the trauma service. [facility] has been asked to consult for syncope. She was in the shower this morning, praying, when she felt hot, nauseated, and dizzy.  Then she woke up on the floor surrounded by blood. Heather Hamilton reports that she has never had a previous syncopal episode. She frequently becomes dehydrated and will have occasional dizzy episodes. She has been on a low carb diet for about 3 weeks, and has been under tremendous personal stress due to being separated from her husband and raising a two year old child.  She denies any chest pain, palpitations, recent illness, changes in vision, headaches, vomiting, dysuria, drug use.      Review of Systems:  Denies recent illness, changes in vision, prior headache, sore throat, abdominal pain, vomiting, dysuria, changes in bowel habits, tremors, and focal weakness.  Past Medical History  Diagnosis Date  . Hypertension   . GERD (gastroesophageal reflux disease)    Past Surgical History  Procedure Laterality Date  . Cesarean section    . Cholecystectomy N/A 07/09/2014    Procedure: LAPAROSCOPIC CHOLECYSTECTOMY;  Surgeon: Liz Malady, MD;  Location: [facility] OR;  Service: General;  Laterality: N/A;   Social History:  reports that she has never smoked. She has never used smokeless tobacco. She reports that she does not drink alcohol or use illicit drugs.  Working, ambulatory, independent.   Allergies  Allergen Reactions  .  Codeine Nausea Only and Other (See Comments)    Makes very heated     Father died in an accident at work when she was 24 years old.  Mother died with metastatic breast cancer.  Prior to Admission medications   Medication Sig Start Date End Date Taking? Authorizing Provider  furosemide (LASIX) 20 MG tablet Take 20 mg by mouth daily. 06/10/14  Yes Historical Provider, MD  lisinopril (PRINIVIL,ZESTRIL) 20 MG tablet Take 20 mg by mouth daily.   Yes  Historical Provider, MD  methocarbamol (ROBAXIN) 500 MG tablet Take 1 tablet (500 mg total) by mouth 2 (two) times daily. Patient not taking: Reported on 05/02/2015 03/30/15   Fayrene Helper, PA-C  naproxen (NAPROSYN) 500 MG tablet Take 1 tablet (500 mg total) by mouth 2 (two) times daily as needed for moderate pain. Patient not taking: Reported on 05/02/2015 03/30/15   Fayrene Helper, PA-C  oxyCODONE (OXY IR/ROXICODONE) 5 MG immediate release tablet Take 1 tablet (5 mg total) by mouth every 6 (six) hours as needed (pain). Patient not taking: Reported on 03/30/2015 07/10/14   Jeralyn Bennett, MD   Physical Exam: Blood pressure 114/66, pulse 88, temperature 98.6 F (37 C), temperature source Oral, resp. rate 16, last menstrual period 06/16/2014, SpO2 98 %. Filed Vitals:   05/02/15 1530  BP: 114/66  Pulse: 88  Temp:   Resp:      General:  WD, WN, Female,  Appears stated age.  Currently in some distress from pain and anxiety.  Eyes: PER, EOMI  ENT: No exudates or erythema in oropharynx  Neck: no lymphadenopathy  Cardiovascular: rrr no m/r/g  Respiratory: CTA, no accessory muscle use.  Abdomen: soft, nt, nd, +bowel sounds, no masses.  Skin: no rashes, bruises, or lesions  Musculoskeletal: 5/5 strength in lower extremities.  Unable to assess upper ext due to pain  Psychiatric: Currently in mild distress and sad but A&O, appropriate and cooperative.   Labs on Admission:  Basic Metabolic Panel:  Recent Labs Lab 05/02/15 0900  NA 138  K 4.1  CL 104  CO2 20*  GLUCOSE 90  BUN 15  CREATININE 0.85  CALCIUM 9.0   CBC:  Recent Labs Lab 05/02/15 0900  WBC 7.1  NEUTROABS 5.7  HGB 12.4  HCT 37.4  MCV 87.4  PLT 154    Radiological Exams on Admission: Ct Head Wo Contrast  05/02/2015   CLINICAL DATA:  Syncopal episode earlier today resulting in a fall hitting head and face. Lip laceration with facial trauma. Neck pain.  EXAM: CT HEAD WITHOUT CONTRAST  CT MAXILLOFACIAL WITHOUT  CONTRAST  CT CERVICAL SPINE WITHOUT CONTRAST  TECHNIQUE: Multidetector CT imaging of the head, cervical spine, and maxillofacial structures were performed using the standard protocol without intravenous contrast. Multiplanar CT image reconstructions of the cervical spine and maxillofacial structures were also generated.  COMPARISON:  CT head 03/03/2012.  FINDINGS: CT HEAD FINDINGS  No evidence for acute infarction, hemorrhage, mass lesion, hydrocephalus, or extra-axial fluid. No atrophy or white matter disease. Intact calvarium. No acute sinus or mastoid disease.  CT MAXILLOFACIAL FINDINGS  There is a pure LeFort 1 type fracture across the base of the BILATERAL maxillary sinuses. Maxillary sinus air-fluid levels representing blood are present. Nasal turbinate hypertrophy and nasal cavity hemorrhage is evident. Nasal septal fracture with RIGHT-to-LEFT deviation. The lateral and medial pterygoid plates appear intact. There is no mandibular fracture or TMJ dislocation. No frontal or sphenoid sinus air-fluid level. Minor BILATERAL ethmoid sinus fluid. No posttraumatic  missing teeth. Multiple prior root canals. Periodontal disease.  No other facial fractures. Zygoma is intact. Nasal bone intact. Orbital walls intact. Air from the LEFT maxillary sinus enters the infratemporal fossa and dissects upward along the LEFT temporalis muscle but there is no orbital emphysema. Globes are intact. Facial soft tissue swelling without visible foreign body or laceration.  CT CERVICAL SPINE FINDINGS  There is no visible cervical spine fracture, traumatic subluxation, prevertebral soft tissue swelling, or intraspinal hematoma. There is mild straightening of the normal cervical lordosis. There is a moderate size calcified disc extrusion at C4-C5 which is  not posttraumatic. Smaller calcified disc protrusion C3-4 also pre-existing. Airway midline. No significant atherosclerosis. No neck masses no lung apex lesion.  IMPRESSION: No acute  intracranial findings.  There is a pure LeFort 1 type fracture across the base of the maxillary sinuses and maxilla. BILATERAL maxillary sinus air-fluid levels are present without corresponding blowout injury.  No cervical spine fracture or traumatic subluxation. Cervical spondylosis most notable at C4-C5.   Electronically Signed   By: Elsie Stain M.D.   On: 05/02/2015 10:10   Ct Cervical Spine Wo Contrast  05/02/2015   CLINICAL DATA:  Syncopal episode earlier today resulting in a fall hitting head and face. Lip laceration with facial trauma. Neck pain.  EXAM: CT HEAD WITHOUT CONTRAST  CT MAXILLOFACIAL WITHOUT CONTRAST  CT CERVICAL SPINE WITHOUT CONTRAST  TECHNIQUE: Multidetector CT imaging of the head, cervical spine, and maxillofacial structures were performed using the standard protocol without intravenous contrast. Multiplanar CT image reconstructions of the cervical spine and maxillofacial structures were also generated.  COMPARISON:  CT head 03/03/2012.  FINDINGS: CT HEAD FINDINGS  No evidence for acute infarction, hemorrhage, mass lesion, hydrocephalus, or extra-axial fluid. No atrophy or white matter disease. Intact calvarium. No acute sinus or mastoid disease.  CT MAXILLOFACIAL FINDINGS  There is a pure LeFort 1 type fracture across the base of the BILATERAL maxillary sinuses. Maxillary sinus air-fluid levels representing blood are present. Nasal turbinate hypertrophy and nasal cavity hemorrhage is evident. Nasal septal fracture with RIGHT-to-LEFT deviation. The lateral and medial pterygoid plates appear intact. There is no mandibular fracture or TMJ dislocation. No frontal or sphenoid sinus air-fluid level. Minor BILATERAL ethmoid sinus fluid. No posttraumatic missing teeth. Multiple prior root canals. Periodontal disease.  No other facial fractures. Zygoma is intact. Nasal bone intact. Orbital walls intact. Air from the LEFT maxillary sinus enters the infratemporal fossa and dissects upward along the  LEFT temporalis muscle but there is no orbital emphysema. Globes are intact. Facial soft tissue swelling without visible foreign body or laceration.  CT CERVICAL SPINE FINDINGS  There is no visible cervical spine fracture, traumatic subluxation, prevertebral soft tissue swelling, or intraspinal hematoma. There is mild straightening of the normal cervical lordosis. There is a moderate size calcified disc extrusion at C4-C5 which is  not posttraumatic. Smaller calcified disc protrusion C3-4 also pre-existing. Airway midline. No significant atherosclerosis. No neck masses no lung apex lesion.  IMPRESSION: No acute intracranial findings.  There is a pure LeFort 1 type fracture across the base of the maxillary sinuses and maxilla. BILATERAL maxillary sinus air-fluid levels are present without corresponding blowout injury.  No cervical spine fracture or traumatic subluxation. Cervical spondylosis most notable at C4-C5.   Electronically Signed   By: Elsie Stain M.D.   On: 05/02/2015 10:10   Mr Cervical Spine Wo Contrast  05/02/2015   CLINICAL DATA:  Dizziness and syncope. Fall. Right arm pain and  tingling.  EXAM: MRI CERVICAL SPINE WITHOUT CONTRAST  TECHNIQUE: Multiplanar, multisequence MR imaging of the cervical spine was performed. No intravenous contrast was administered.  COMPARISON:  None.  FINDINGS: Motion degraded study which could obscure subtle or small pathology.  No indication of occult fracture. No evidence of ligamentous insufficiency. Normal cord signal and morphology. No spinal canal hematoma.  An incidental hemangioma is noted in the upper C5 body.  No extra-spinal findings to explain. Normal flow related signal loss in the bilateral cervical and carotid arteries.  Degenerative changes:  C2-3: Unremarkable.  C3-4: Mild uncovertebral spurring on the right with early foraminal stenosis.  C4-5: Degenerative disc bulging with superimposed central protrusion which contacts the ventral cord. Uncovertebral  spurs and disc causes bilateral foraminal stenosis. Grading is limited by patient motion  C5-6: Circumferential bulging of disc, effacing the ventral CSF. Bilateral uncovertebral spurs with likely mild foraminal stenosis  C6-7: Disc narrowing and circumferential bulging. Uncovertebral spur and disc in the foramina cause advanced bilateral foraminal stenosis. Near complete effacement of canal CSF without cord compression. This is consistent with mild spinal canal stenosis  C7-T1:Mild facet arthropathy on the left.  No impingement  IMPRESSION: 1. Mildly motion degraded study. 2. No occult fracture, ligament tear, or cord injury. 3. Mid and lower cervical degenerative disc disease, as above, with advanced bilateral C4-5 and C6-7 foraminal stenosis.   Electronically Signed   By: Marnee Spring M.D.   On: 05/02/2015 13:01   Dg Chest Port 1 View  05/02/2015   CLINICAL DATA:  Dizziness and syncopal episode  EXAM: PORTABLE CHEST - 1 VIEW  COMPARISON:  07/07/2014  FINDINGS: Cardiac shadow is mildly enlarged but stable. Some fullness is noted in the right peritracheal region likely related to the portable technique and stable from the prior exam. The lungs are clear bilaterally. No bony abnormality is seen.  IMPRESSION: No acute abnormality noted.   Electronically Signed   By: Alcide Clever M.D.   On: 05/02/2015 08:56   Ct Maxillofacial Wo Cm  05/02/2015   CLINICAL DATA:  Syncopal episode earlier today resulting in a fall hitting head and face. Lip laceration with facial trauma. Neck pain.  EXAM: CT HEAD WITHOUT CONTRAST  CT MAXILLOFACIAL WITHOUT CONTRAST  CT CERVICAL SPINE WITHOUT CONTRAST  TECHNIQUE: Multidetector CT imaging of the head, cervical spine, and maxillofacial structures were performed using the standard protocol without intravenous contrast. Multiplanar CT image reconstructions of the cervical spine and maxillofacial structures were also generated.  COMPARISON:  CT head 03/03/2012.  FINDINGS: CT HEAD  FINDINGS  No evidence for acute infarction, hemorrhage, mass lesion, hydrocephalus, or extra-axial fluid. No atrophy or white matter disease. Intact calvarium. No acute sinus or mastoid disease.  CT MAXILLOFACIAL FINDINGS  There is a pure LeFort 1 type fracture across the base of the BILATERAL maxillary sinuses. Maxillary sinus air-fluid levels representing blood are present. Nasal turbinate hypertrophy and nasal cavity hemorrhage is evident. Nasal septal fracture with RIGHT-to-LEFT deviation. The lateral and medial pterygoid plates appear intact. There is no mandibular fracture or TMJ dislocation. No frontal or sphenoid sinus air-fluid level. Minor BILATERAL ethmoid sinus fluid. No posttraumatic missing teeth. Multiple prior root canals. Periodontal disease.  No other facial fractures. Zygoma is intact. Nasal bone intact. Orbital walls intact. Air from the LEFT maxillary sinus enters the infratemporal fossa and dissects upward along the LEFT temporalis muscle but there is no orbital emphysema. Globes are intact. Facial soft tissue swelling without visible foreign body or laceration.  CT CERVICAL SPINE FINDINGS  There is no visible cervical spine fracture, traumatic subluxation, prevertebral soft tissue swelling, or intraspinal hematoma. There is mild straightening of the normal cervical lordosis. There is a moderate size calcified disc extrusion at C4-C5 which is  not posttraumatic. Smaller calcified disc protrusion C3-4 also pre-existing. Airway midline. No significant atherosclerosis. No neck masses no lung apex lesion.  IMPRESSION: No acute intracranial findings.  There is a pure LeFort 1 type fracture across the base of the maxillary sinuses and maxilla. BILATERAL maxillary sinus air-fluid levels are present without corresponding blowout injury.  No cervical spine fracture or traumatic subluxation. Cervical spondylosis most notable at C4-C5.   Electronically Signed   By: Elsie Stain M.D.   On: 05/02/2015  10:10    EKG:  NSR with a question of arrhythmia in V1.  Time spent: 45 min.  Conley Canal [facility] Pager [phone]  If 7PM-7AM, please contact night-coverage [email or web address] Password [email or web address] 05/02/2015, 4:14 PM

## 2015-05-02 NOTE — H&P (Signed)
Chief Complaint: fall, syncope HPI: Heather Hamilton is a 49 year old female with a history of hypertension presenting to [facility] via EMS following a syncopal episode and a fall at home.  The patient reports getting in the shower developing lightheadedness, weakness, sweating and the next thing, is getting up outside of the shower with massive amount of blood around her.  She does not know how long, she called her stepdaughter for help.  She complained of facial and neck pain, weakness to both arms.  She denies previous symptoms. She fell about 1 month ago, but tripped over uneven pavement, no syncopal symptoms.  She admits to being on a new diet and takes BP medication and lasix for "fluid."  Her work up shows normal labs.  Negative CXR.  CTH negative.  Maxillofacial CT showed a LeFort 1 type fracture.  CT cervical spine noted spondylosis C4-C5.  MRI of cervical spine showed C4-C5 and C6-C7 foraminal stenosis.   Past Medical History  Diagnosis Date  . Hypertension   . GERD (gastroesophageal reflux disease)     Past Surgical History  Procedure Laterality Date  . Cesarean section    . Cholecystectomy N/A 07/09/2014    Procedure: LAPAROSCOPIC CHOLECYSTECTOMY;  Surgeon: Zenovia Jarred, MD;  Location: [facility];  Service: General;  Laterality: N/A;    History reviewed. No pertinent family history. Social History:  reports that she has never smoked. She has never used smokeless tobacco. She reports that she does not drink alcohol or use illicit drugs.  Allergies:  Allergies  Allergen Reactions  . Codeine Nausea Only and Other (See Comments)    Makes very heated      (Not in a hospital admission)  Results for orders placed or performed during the hospital encounter of 05/02/15 (from the past 48 hour(s))  CBC with Differential     Status: Abnormal   Collection Time: 05/02/15  9:00 AM  Result Value Ref Range   WBC 7.1 4.0 - 10.5 K/uL   RBC 4.28 3.87 - 5.11 MIL/uL   Hemoglobin 12.4 12.0 - 15.0 g/dL    HCT 37.4 36.0 - 46.0 %   MCV 87.4 78.0 - 100.0 fL   MCH 29.0 26.0 - 34.0 pg   MCHC 33.2 30.0 - 36.0 g/dL   RDW 14.5 11.5 - 15.5 %   Platelets 154 150 - 400 K/uL   Neutrophils Relative % 80 (H) 43 - 77 %   Neutro Abs 5.7 1.7 - 7.7 K/uL   Lymphocytes Relative 15 12 - 46 %   Lymphs Abs 1.1 0.7 - 4.0 K/uL   Monocytes Relative 5 3 - 12 %   Monocytes Absolute 0.3 0.1 - 1.0 K/uL   Eosinophils Relative 0 0 - 5 %   Eosinophils Absolute 0.0 0.0 - 0.7 K/uL   Basophils Relative 0 0 - 1 %   Basophils Absolute 0.0 0.0 - 0.1 K/uL  Basic metabolic panel     Status: Abnormal   Collection Time: 05/02/15  9:00 AM  Result Value Ref Range   Sodium 138 135 - 145 mmol/L   Potassium 4.1 3.5 - 5.1 mmol/L   Chloride 104 101 - 111 mmol/L   CO2 20 (L) 22 - 32 mmol/L   Glucose, Bld 90 65 - 99 mg/dL   BUN 15 6 - 20 mg/dL   Creatinine, Ser 0.85 0.44 - 1.00 mg/dL   Calcium 9.0 8.9 - 10.3 mg/dL   GFR calc non Af Amer >60 >60 mL/min  GFR calc Af Amer >60 >60 mL/min    Comment: (NOTE) The eGFR has been calculated using the CKD EPI equation. This calculation has not been validated in all clinical situations. eGFR's persistently <60 mL/min signify possible Chronic Kidney Disease.    Anion gap 14 5 - 15   Ct Head Wo Contrast  05/02/2015   CLINICAL DATA:  Syncopal episode earlier today resulting in a fall hitting head and face. Lip laceration with facial trauma. Neck pain.  EXAM: CT HEAD WITHOUT CONTRAST  CT MAXILLOFACIAL WITHOUT CONTRAST  CT CERVICAL SPINE WITHOUT CONTRAST  TECHNIQUE: Multidetector CT imaging of the head, cervical spine, and maxillofacial structures were performed using the standard protocol without intravenous contrast. Multiplanar CT image reconstructions of the cervical spine and maxillofacial structures were also generated.  COMPARISON:  CT head 03/03/2012.  FINDINGS: CT HEAD FINDINGS  No evidence for acute infarction, hemorrhage, mass lesion, hydrocephalus, or extra-axial fluid. No atrophy  or white matter disease. Intact calvarium. No acute sinus or mastoid disease.  CT MAXILLOFACIAL FINDINGS  There is a pure LeFort 1 type fracture across the base of the BILATERAL maxillary sinuses. Maxillary sinus air-fluid levels representing blood are present. Nasal turbinate hypertrophy and nasal cavity hemorrhage is evident. Nasal septal fracture with RIGHT-to-LEFT deviation. The lateral and medial pterygoid plates appear intact. There is no mandibular fracture or TMJ dislocation. No frontal or sphenoid sinus air-fluid level. Minor BILATERAL ethmoid sinus fluid. No posttraumatic missing teeth. Multiple prior root canals. Periodontal disease.  No other facial fractures. Zygoma is intact. Nasal bone intact. Orbital walls intact. Air from the LEFT maxillary sinus enters the infratemporal fossa and dissects upward along the LEFT temporalis muscle but there is no orbital emphysema. Globes are intact. Facial soft tissue swelling without visible foreign body or laceration.  CT CERVICAL SPINE FINDINGS  There is no visible cervical spine fracture, traumatic subluxation, prevertebral soft tissue swelling, or intraspinal hematoma. There is mild straightening of the normal cervical lordosis. There is a moderate size calcified disc extrusion at C4-C5 which is  not posttraumatic. Smaller calcified disc protrusion C3-4 also pre-existing. Airway midline. No significant atherosclerosis. No neck masses no lung apex lesion.  IMPRESSION: No acute intracranial findings.  There is a pure LeFort 1 type fracture across the base of the maxillary sinuses and maxilla. BILATERAL maxillary sinus air-fluid levels are present without corresponding blowout injury.  No cervical spine fracture or traumatic subluxation. Cervical spondylosis most notable at C4-C5.   Electronically Signed   By: Staci Righter M.D.   On: 05/02/2015 10:10   Ct Cervical Spine Wo Contrast  05/02/2015   CLINICAL DATA:  Syncopal episode earlier today resulting in a fall  hitting head and face. Lip laceration with facial trauma. Neck pain.  EXAM: CT HEAD WITHOUT CONTRAST  CT MAXILLOFACIAL WITHOUT CONTRAST  CT CERVICAL SPINE WITHOUT CONTRAST  TECHNIQUE: Multidetector CT imaging of the head, cervical spine, and maxillofacial structures were performed using the standard protocol without intravenous contrast. Multiplanar CT image reconstructions of the cervical spine and maxillofacial structures were also generated.  COMPARISON:  CT head 03/03/2012.  FINDINGS: CT HEAD FINDINGS  No evidence for acute infarction, hemorrhage, mass lesion, hydrocephalus, or extra-axial fluid. No atrophy or white matter disease. Intact calvarium. No acute sinus or mastoid disease.  CT MAXILLOFACIAL FINDINGS  There is a pure LeFort 1 type fracture across the base of the BILATERAL maxillary sinuses. Maxillary sinus air-fluid levels representing blood are present. Nasal turbinate hypertrophy and nasal cavity hemorrhage is evident.  Nasal septal fracture with RIGHT-to-LEFT deviation. The lateral and medial pterygoid plates appear intact. There is no mandibular fracture or TMJ dislocation. No frontal or sphenoid sinus air-fluid level. Minor BILATERAL ethmoid sinus fluid. No posttraumatic missing teeth. Multiple prior root canals. Periodontal disease.  No other facial fractures. Zygoma is intact. Nasal bone intact. Orbital walls intact. Air from the LEFT maxillary sinus enters the infratemporal fossa and dissects upward along the LEFT temporalis muscle but there is no orbital emphysema. Globes are intact. Facial soft tissue swelling without visible foreign body or laceration.  CT CERVICAL SPINE FINDINGS  There is no visible cervical spine fracture, traumatic subluxation, prevertebral soft tissue swelling, or intraspinal hematoma. There is mild straightening of the normal cervical lordosis. There is a moderate size calcified disc extrusion at C4-C5 which is  not posttraumatic. Smaller calcified disc protrusion C3-4  also pre-existing. Airway midline. No significant atherosclerosis. No neck masses no lung apex lesion.  IMPRESSION: No acute intracranial findings.  There is a pure LeFort 1 type fracture across the base of the maxillary sinuses and maxilla. BILATERAL maxillary sinus air-fluid levels are present without corresponding blowout injury.  No cervical spine fracture or traumatic subluxation. Cervical spondylosis most notable at C4-C5.   Electronically Signed   By: Staci Righter M.D.   On: 05/02/2015 10:10   Mr Cervical Spine Wo Contrast  05/02/2015   CLINICAL DATA:  Dizziness and syncope. Fall. Right arm pain and tingling.  EXAM: MRI CERVICAL SPINE WITHOUT CONTRAST  TECHNIQUE: Multiplanar, multisequence MR imaging of the cervical spine was performed. No intravenous contrast was administered.  COMPARISON:  None.  FINDINGS: Motion degraded study which could obscure subtle or small pathology.  No indication of occult fracture. No evidence of ligamentous insufficiency. Normal cord signal and morphology. No spinal canal hematoma.  An incidental hemangioma is noted in the upper C5 body.  No extra-spinal findings to explain. Normal flow related signal loss in the bilateral cervical and carotid arteries.  Degenerative changes:  C2-3: Unremarkable.  C3-4: Mild uncovertebral spurring on the right with early foraminal stenosis.  C4-5: Degenerative disc bulging with superimposed central protrusion which contacts the ventral cord. Uncovertebral spurs and disc causes bilateral foraminal stenosis. Grading is limited by patient motion  C5-6: Circumferential bulging of disc, effacing the ventral CSF. Bilateral uncovertebral spurs with likely mild foraminal stenosis  C6-7: Disc narrowing and circumferential bulging. Uncovertebral spur and disc in the foramina cause advanced bilateral foraminal stenosis. Near complete effacement of canal CSF without cord compression. This is consistent with mild spinal canal stenosis  C7-T1:Mild facet  arthropathy on the left.  No impingement  IMPRESSION: 1. Mildly motion degraded study. 2. No occult fracture, ligament tear, or cord injury. 3. Mid and lower cervical degenerative disc disease, as above, with advanced bilateral C4-5 and C6-7 foraminal stenosis.   Electronically Signed   By: Monte Fantasia M.D.   On: 05/02/2015 13:01   Dg Chest Port 1 View  05/02/2015   CLINICAL DATA:  Dizziness and syncopal episode  EXAM: PORTABLE CHEST - 1 VIEW  COMPARISON:  07/07/2014  FINDINGS: Cardiac shadow is mildly enlarged but stable. Some fullness is noted in the right peritracheal region likely related to the portable technique and stable from the prior exam. The lungs are clear bilaterally. No bony abnormality is seen.  IMPRESSION: No acute abnormality noted.   Electronically Signed   By: Inez Catalina M.D.   On: 05/02/2015 08:56   Ct Maxillofacial Wo Cm  05/02/2015  CLINICAL DATA:  Syncopal episode earlier today resulting in a fall hitting head and face. Lip laceration with facial trauma. Neck pain.  EXAM: CT HEAD WITHOUT CONTRAST  CT MAXILLOFACIAL WITHOUT CONTRAST  CT CERVICAL SPINE WITHOUT CONTRAST  TECHNIQUE: Multidetector CT imaging of the head, cervical spine, and maxillofacial structures were performed using the standard protocol without intravenous contrast. Multiplanar CT image reconstructions of the cervical spine and maxillofacial structures were also generated.  COMPARISON:  CT head 03/03/2012.  FINDINGS: CT HEAD FINDINGS  No evidence for acute infarction, hemorrhage, mass lesion, hydrocephalus, or extra-axial fluid. No atrophy or white matter disease. Intact calvarium. No acute sinus or mastoid disease.  CT MAXILLOFACIAL FINDINGS  There is a pure LeFort 1 type fracture across the base of the BILATERAL maxillary sinuses. Maxillary sinus air-fluid levels representing blood are present. Nasal turbinate hypertrophy and nasal cavity hemorrhage is evident. Nasal septal fracture with RIGHT-to-LEFT deviation.  The lateral and medial pterygoid plates appear intact. There is no mandibular fracture or TMJ dislocation. No frontal or sphenoid sinus air-fluid level. Minor BILATERAL ethmoid sinus fluid. No posttraumatic missing teeth. Multiple prior root canals. Periodontal disease.  No other facial fractures. Zygoma is intact. Nasal bone intact. Orbital walls intact. Air from the LEFT maxillary sinus enters the infratemporal fossa and dissects upward along the LEFT temporalis muscle but there is no orbital emphysema. Globes are intact. Facial soft tissue swelling without visible foreign body or laceration.  CT CERVICAL SPINE FINDINGS  There is no visible cervical spine fracture, traumatic subluxation, prevertebral soft tissue swelling, or intraspinal hematoma. There is mild straightening of the normal cervical lordosis. There is a moderate size calcified disc extrusion at C4-C5 which is  not posttraumatic. Smaller calcified disc protrusion C3-4 also pre-existing. Airway midline. No significant atherosclerosis. No neck masses no lung apex lesion.  IMPRESSION: No acute intracranial findings.  There is a pure LeFort 1 type fracture across the base of the maxillary sinuses and maxilla. BILATERAL maxillary sinus air-fluid levels are present without corresponding blowout injury.  No cervical spine fracture or traumatic subluxation. Cervical spondylosis most notable at C4-C5.   Electronically Signed   By: Staci Righter M.D.   On: 05/02/2015 10:10    Review of Systems  All other systems reviewed and are negative.   Blood pressure 105/62, pulse 81, temperature 98.6 F (37 C), temperature source Oral, resp. rate 16, last menstrual period 06/16/2014, SpO2 95 %. Physical Exam  Constitutional: She is oriented to person, place, and time. She appears well-developed and well-nourished. She appears distressed.  HENT:  Head: Normocephalic.  Right Ear: External ear normal.  Left Ear: External ear normal.  Mouth/Throat: Oropharynx  is clear and moist. No oropharyngeal exudate.  Laceration to lip   Eyes: Conjunctivae and EOM are normal. Pupils are equal, round, and reactive to light. Right eye exhibits no discharge. Left eye exhibits no discharge. No scleral icterus.  Neck: Normal range of motion. No tracheal deviation present.  Cardiovascular: Normal rate, regular rhythm, normal heart sounds and intact distal pulses.  Exam reveals no gallop and no friction rub.   No murmur heard. Respiratory: Effort normal and breath sounds normal. No stridor. No respiratory distress. She has no wheezes. She has no rales. She exhibits no tenderness.  GI: Soft. Bowel sounds are normal. She exhibits no distension and no mass. There is no tenderness. There is no rebound and no guarding.  Musculoskeletal: Normal range of motion. She exhibits no edema or tenderness.  Neurological: She is alert  and oriented to person, place, and time. GCS eye subscore is 4. GCS verbal subscore is 5. GCS motor subscore is 6.  Weak grip.  Able to raise both arms, painful. Hyperesthesia bilateral hands.  Normal BLEs.   Skin: Skin is warm. No rash noted. She is diaphoretic. No erythema. No pallor.  Psychiatric: She has a normal mood and affect. Judgment and thought content normal.     Assessment/Plan GLF C4-5 and C6-7 foraminal stenosis/central cord syndrome-admit for pain control.  D/W Dr. Arnoldo Morale, will evaluate the patient.  Will likely need surgery in the future.  Does not need a collar.  Start lyrica.  LeFort type I fracture-Dr. Janace Hoard to evaluate Lip laceration-sutures by EDP.  Will need to have sutures removed 4-5 days Syncope-cardiac monitoring.  Hold BP meds and lasix.  Medicine to evaluate.  VTE prophylaxis-SCD/lovenox FEN-IVF, clears, advance as tolerated. Dispo-admit. PT eval.    Nyela Cortinas ANP-BC 05/02/2015, 2:58 PM

## 2015-05-02 NOTE — Consult Note (Signed)
Reason for Consult: Central cord syndrome Referring Physician: Dr. Cleora Hamilton is an 49 y.o. female.  HPI: The patient is a 49 year old white female who by report had a syncopal event while in the shower. She struck the floor and lacerated her face. She was brought to the [facility] emergency department. She was worked up with a cervical MRI which demonstrated some cervical stenosis and a neurosurgical consultation has been requested.  Presently the patient is accompanied by her husband. She admits to some neck soreness, but not pain per se. She complains of a burning numbness in her right greater left arms and hands.  Past Medical History  Diagnosis Date  . Hypertension   . GERD (gastroesophageal reflux disease)     Past Surgical History  Procedure Laterality Date  . Cesarean section    . Cholecystectomy N/A 07/09/2014    Procedure: LAPAROSCOPIC CHOLECYSTECTOMY;  Surgeon: Heather Jarred, MD;  Location: [place];  Service: General;  Laterality: N/A;    History reviewed. No pertinent family history.  Social History:  reports that she has never smoked. She has never used smokeless tobacco. She reports that she does not drink alcohol or use illicit drugs.  Allergies:  Allergies  Allergen Reactions  . Codeine Nausea Only and Other (See Comments)    Makes very heated     Medications:  I have reviewed the patient's current medications. Prior to Admission:  (Not in a hospital admission) Scheduled: . enoxaparin (LOVENOX) injection  40 mg Subcutaneous Q24H  . pregabalin  75 mg Oral BID   Continuous: . sodium chloride    . dextrose 5 % and 0.9% NaCl     ZOX:[id number], HYDROmorphone (DILAUDID) injection, ondansetron **OR** ondansetron (ZOFRAN) IV, oxyCODONE Anti-infectives    None       Results for orders placed or performed during the hospital encounter of 05/02/15 (from the past 48 hour(s))  CBC with Differential     Status: Abnormal   Collection Time:  05/02/15  9:00 AM  Result Value Ref Range   WBC 7.1 4.0 - 10.5 K/uL   RBC 4.28 3.87 - 5.11 MIL/uL   Hemoglobin 12.4 12.0 - 15.0 g/dL   HCT 37.4 36.0 - 46.0 %   MCV 87.4 78.0 - 100.0 fL   MCH 29.0 26.0 - 34.0 pg   MCHC 33.2 30.0 - 36.0 g/dL   RDW 14.5 11.5 - 15.5 %   Platelets 154 150 - 400 K/uL   Neutrophils Relative % 80 (H) 43 - 77 %   Neutro Abs 5.7 1.7 - 7.7 K/uL   Lymphocytes Relative 15 12 - 46 %   Lymphs Abs 1.1 0.7 - 4.0 K/uL   Monocytes Relative 5 3 - 12 %   Monocytes Absolute 0.3 0.1 - 1.0 K/uL   Eosinophils Relative 0 0 - 5 %   Eosinophils Absolute 0.0 0.0 - 0.7 K/uL   Basophils Relative 0 0 - 1 %   Basophils Absolute 0.0 0.0 - 0.1 K/uL  Basic metabolic panel     Status: Abnormal   Collection Time: 05/02/15  9:00 AM  Result Value Ref Range   Sodium 138 135 - 145 mmol/L   Potassium 4.1 3.5 - 5.1 mmol/L   Chloride 104 101 - 111 mmol/L   CO2 20 (L) 22 - 32 mmol/L   Glucose, Bld 90 65 - 99 mg/dL   BUN 15 6 - 20 mg/dL   Creatinine, Ser 0.85 0.44 - 1.00 mg/dL  Calcium 9.0 8.9 - 10.3 mg/dL   GFR calc non Af Amer >60 >60 mL/min   GFR calc Af Amer >60 >60 mL/min    Comment: (NOTE) The eGFR has been calculated using the CKD EPI equation. This calculation has not been validated in all clinical situations. eGFR's persistently <60 mL/min signify possible Chronic Kidney Disease.    Anion gap 14 5 - 15    Ct Head Wo Contrast  05/02/2015   CLINICAL DATA:  Syncopal episode earlier today resulting in a fall hitting head and face. Lip laceration with facial trauma. Neck pain.  EXAM: CT HEAD WITHOUT CONTRAST  CT MAXILLOFACIAL WITHOUT CONTRAST  CT CERVICAL SPINE WITHOUT CONTRAST  TECHNIQUE: Multidetector CT imaging of the head, cervical spine, and maxillofacial structures were performed using the standard protocol without intravenous contrast. Multiplanar CT image reconstructions of the cervical spine and maxillofacial structures were also generated.  COMPARISON:  CT head  03/03/2012.  FINDINGS: CT HEAD FINDINGS  No evidence for acute infarction, hemorrhage, mass lesion, hydrocephalus, or extra-axial fluid. No atrophy or white matter disease. Intact calvarium. No acute sinus or mastoid disease.  CT MAXILLOFACIAL FINDINGS  There is a pure LeFort 1 type fracture across the base of the BILATERAL maxillary sinuses. Maxillary sinus air-fluid levels representing blood are present. Nasal turbinate hypertrophy and nasal cavity hemorrhage is evident. Nasal septal fracture with RIGHT-to-LEFT deviation. The lateral and medial pterygoid plates appear intact. There is no mandibular fracture or TMJ dislocation. No frontal or sphenoid sinus air-fluid level. Minor BILATERAL ethmoid sinus fluid. No posttraumatic missing teeth. Multiple prior root canals. Periodontal disease.  No other facial fractures. Zygoma is intact. Nasal bone intact. Orbital walls intact. Air from the LEFT maxillary sinus enters the infratemporal fossa and dissects upward along the LEFT temporalis muscle but there is no orbital emphysema. Globes are intact. Facial soft tissue swelling without visible foreign body or laceration.  CT CERVICAL SPINE FINDINGS  There is no visible cervical spine fracture, traumatic subluxation, prevertebral soft tissue swelling, or intraspinal hematoma. There is mild straightening of the normal cervical lordosis. There is a moderate size calcified disc extrusion at C4-C5 which is  not posttraumatic. Smaller calcified disc protrusion C3-4 also pre-existing. Airway midline. No significant atherosclerosis. No neck masses no lung apex lesion.  IMPRESSION: No acute intracranial findings.  There is a pure LeFort 1 type fracture across the base of the maxillary sinuses and maxilla. BILATERAL maxillary sinus air-fluid levels are present without corresponding blowout injury.  No cervical spine fracture or traumatic subluxation. Cervical spondylosis most notable at C4-C5.   Electronically Signed   By: Heather Hamilton M.D.   On: 05/02/2015 10:10   Ct Cervical Spine Wo Contrast  05/02/2015   CLINICAL DATA:  Syncopal episode earlier today resulting in a fall hitting head and face. Lip laceration with facial trauma. Neck pain.  EXAM: CT HEAD WITHOUT CONTRAST  CT MAXILLOFACIAL WITHOUT CONTRAST  CT CERVICAL SPINE WITHOUT CONTRAST  TECHNIQUE: Multidetector CT imaging of the head, cervical spine, and maxillofacial structures were performed using the standard protocol without intravenous contrast. Multiplanar CT image reconstructions of the cervical spine and maxillofacial structures were also generated.  COMPARISON:  CT head 03/03/2012.  FINDINGS: CT HEAD FINDINGS  No evidence for acute infarction, hemorrhage, mass lesion, hydrocephalus, or extra-axial fluid. No atrophy or white matter disease. Intact calvarium. No acute sinus or mastoid disease.  CT MAXILLOFACIAL FINDINGS  There is a pure LeFort 1 type fracture across the base of the BILATERAL  maxillary sinuses. Maxillary sinus air-fluid levels representing blood are present. Nasal turbinate hypertrophy and nasal cavity hemorrhage is evident. Nasal septal fracture with RIGHT-to-LEFT deviation. The lateral and medial pterygoid plates appear intact. There is no mandibular fracture or TMJ dislocation. No frontal or sphenoid sinus air-fluid level. Minor BILATERAL ethmoid sinus fluid. No posttraumatic missing teeth. Multiple prior root canals. Periodontal disease.  No other facial fractures. Zygoma is intact. Nasal bone intact. Orbital walls intact. Air from the LEFT maxillary sinus enters the infratemporal fossa and dissects upward along the LEFT temporalis muscle but there is no orbital emphysema. Globes are intact. Facial soft tissue swelling without visible foreign body or laceration.  CT CERVICAL SPINE FINDINGS  There is no visible cervical spine fracture, traumatic subluxation, prevertebral soft tissue swelling, or intraspinal hematoma. There is mild straightening of the  normal cervical lordosis. There is a moderate size calcified disc extrusion at C4-C5 which is  not posttraumatic. Smaller calcified disc protrusion C3-4 also pre-existing. Airway midline. No significant atherosclerosis. No neck masses no lung apex lesion.  IMPRESSION: No acute intracranial findings.  There is a pure LeFort 1 type fracture across the base of the maxillary sinuses and maxilla. BILATERAL maxillary sinus air-fluid levels are present without corresponding blowout injury.  No cervical spine fracture or traumatic subluxation. Cervical spondylosis most notable at C4-C5.   Electronically Signed   By: Heather Hamilton M.D.   On: 05/02/2015 10:10   Mr Cervical Spine Wo Contrast  05/02/2015   CLINICAL DATA:  Dizziness and syncope. Fall. Right arm pain and tingling.  EXAM: MRI CERVICAL SPINE WITHOUT CONTRAST  TECHNIQUE: Multiplanar, multisequence MR imaging of the cervical spine was performed. No intravenous contrast was administered.  COMPARISON:  None.  FINDINGS: Motion degraded study which could obscure subtle or small pathology.  No indication of occult fracture. No evidence of ligamentous insufficiency. Normal cord signal and morphology. No spinal canal hematoma.  An incidental hemangioma is noted in the upper C5 body.  No extra-spinal findings to explain. Normal flow related signal loss in the bilateral cervical and carotid arteries.  Degenerative changes:  C2-3: Unremarkable.  C3-4: Mild uncovertebral spurring on the right with early foraminal stenosis.  C4-5: Degenerative disc bulging with superimposed central protrusion which contacts the ventral cord. Uncovertebral spurs and disc causes bilateral foraminal stenosis. Grading is limited by patient motion  C5-6: Circumferential bulging of disc, effacing the ventral CSF. Bilateral uncovertebral spurs with likely mild foraminal stenosis  C6-7: Disc narrowing and circumferential bulging. Uncovertebral spur and disc in the foramina cause advanced bilateral  foraminal stenosis. Near complete effacement of canal CSF without cord compression. This is consistent with mild spinal canal stenosis  C7-T1:Mild facet arthropathy on the left.  No impingement  IMPRESSION: 1. Mildly motion degraded study. 2. No occult fracture, ligament tear, or cord injury. 3. Mid and lower cervical degenerative disc disease, as above, with advanced bilateral C4-5 and C6-7 foraminal stenosis.   Electronically Signed   By: Monte Fantasia M.D.   On: 05/02/2015 13:01   Dg Chest Port 1 View  05/02/2015   CLINICAL DATA:  Dizziness and syncopal episode  EXAM: PORTABLE CHEST - 1 VIEW  COMPARISON:  07/07/2014  FINDINGS: Cardiac shadow is mildly enlarged but stable. Some fullness is noted in the right peritracheal region likely related to the portable technique and stable from the prior exam. The lungs are clear bilaterally. No bony abnormality is seen.  IMPRESSION: No acute abnormality noted.   Electronically Signed   By:  Inez Catalina M.D.   On: 05/02/2015 08:56   Ct Maxillofacial Wo Cm  05/02/2015   CLINICAL DATA:  Syncopal episode earlier today resulting in a fall hitting head and face. Lip laceration with facial trauma. Neck pain.  EXAM: CT HEAD WITHOUT CONTRAST  CT MAXILLOFACIAL WITHOUT CONTRAST  CT CERVICAL SPINE WITHOUT CONTRAST  TECHNIQUE: Multidetector CT imaging of the head, cervical spine, and maxillofacial structures were performed using the standard protocol without intravenous contrast. Multiplanar CT image reconstructions of the cervical spine and maxillofacial structures were also generated.  COMPARISON:  CT head 03/03/2012.  FINDINGS: CT HEAD FINDINGS  No evidence for acute infarction, hemorrhage, mass lesion, hydrocephalus, or extra-axial fluid. No atrophy or white matter disease. Intact calvarium. No acute sinus or mastoid disease.  CT MAXILLOFACIAL FINDINGS  There is a pure LeFort 1 type fracture across the base of the BILATERAL maxillary sinuses. Maxillary sinus air-fluid levels  representing blood are present. Nasal turbinate hypertrophy and nasal cavity hemorrhage is evident. Nasal septal fracture with RIGHT-to-LEFT deviation. The lateral and medial pterygoid plates appear intact. There is no mandibular fracture or TMJ dislocation. No frontal or sphenoid sinus air-fluid level. Minor BILATERAL ethmoid sinus fluid. No posttraumatic missing teeth. Multiple prior root canals. Periodontal disease.  No other facial fractures. Zygoma is intact. Nasal bone intact. Orbital walls intact. Air from the LEFT maxillary sinus enters the infratemporal fossa and dissects upward along the LEFT temporalis muscle but there is no orbital emphysema. Globes are intact. Facial soft tissue swelling without visible foreign body or laceration.  CT CERVICAL SPINE FINDINGS  There is no visible cervical spine fracture, traumatic subluxation, prevertebral soft tissue swelling, or intraspinal hematoma. There is mild straightening of the normal cervical lordosis. There is a moderate size calcified disc extrusion at C4-C5 which is  not posttraumatic. Smaller calcified disc protrusion C3-4 also pre-existing. Airway midline. No significant atherosclerosis. No neck masses no lung apex lesion.  IMPRESSION: No acute intracranial findings.  There is a pure LeFort 1 type fracture across the base of the maxillary sinuses and maxilla. BILATERAL maxillary sinus air-fluid levels are present without corresponding blowout injury.  No cervical spine fracture or traumatic subluxation. Cervical spondylosis most notable at C4-C5.   Electronically Signed   By: Heather Hamilton M.D.   On: 05/02/2015 10:10    ROS: As above Blood pressure 114/66, pulse 88, temperature 98.6 F (37 C), temperature source Oral, resp. rate 16, last menstrual period 06/16/2014, SpO2 98 %. Physical Exam  General: A somnolent, but arousable, 49 year old obese white female in no apparent distress.  HEENT: The patient has facial lacerations which have been  sutured. Her pupils are equal round reactive light, extraocular muscles intact, there is no signs of CSF otorrhea or rhinorrhea. There is no battle signs or raccoon's eyes.  Neck: Supple without masses or deformities. She has a mildly decreased cervical range of motion. Spurling's testing is negative, Lhermitt sign was not present.  Thorax: Symmetric  Abdomen: Soft  Extremities: No obvious abnormalities  Neurologic exam: The patient is alert and oriented 3, Glasgow Coma Scale 15. Cranial nerves II through XII are examined bilaterally grossly normal. Vision and hearing are grossly normal bilaterally. Motor strength is 5 over 5 in her bilateral deltoid, bicep, tricep, quadriceps, gastrocnemius, dorsiflexors. She has some weakness in her hands right greater than left at 4-4+ over 5. The patient has dysesthesias in her bilateral arms and hands. She's hyperalgesic in her bilateral arms and hands, right greater than left. She  has some difficulty with rapid alternating movements of the upper extremities bilaterally.  I have reviewed the patient's cervical MRI performed at [facility] today. The patient has a somewhat straightened cervical spine. She appears to have a component of congenital spinal stenosis. She has moderate stenosis at C6-7. I don't see any significant ongoing neural compression or evidence of contusions.  Assessment/Plan: Cervical stenosis, central cord syndrome: I discussed the situation with the patient, husband, and Dr. Grandville Silos. At this point I would recommend symptomatic management with medications. I think she would benefit from anterior cervical discectomy, and fusion in the future to hopefully prevent future occurrences of central cord syndrome. This does not need to be done during this hospitalization. Please have her follow-up with me in the office. I have answered all the patient's, and her husbands, questions.  Sharonna Vinje D 05/02/2015, 4:07 PM

## 2015-05-02 NOTE — Consult Note (Addendum)
Reason for Consult: LeFort I fracture Referring Physician: Trauma  Heather Hamilton is an 49 y.o. female.  HPI: 49 year-old sustained a fall and sustained a midface fracture. She has maxillary fractures that are consistent with a LeFort I fracture. She does have malocclusion. She has a fractured tooth of the upper teeth. She sustained a laceration of the lower lip that was closed by the emergency room. She doesn't have any difficulty with breathing. Her nose is without swelling or external change. No diplopia or vision changes. She is having some neck discomfort but also some numbness in her arm and this has been evaluated by neurosurgery.  Past Medical History  Diagnosis Date  . Hypertension   . GERD (gastroesophageal reflux disease)     Past Surgical History  Procedure Laterality Date  . Cesarean section    . Cholecystectomy N/A 07/09/2014    Procedure: LAPAROSCOPIC CHOLECYSTECTOMY;  Surgeon: Zenovia Jarred, MD;  Location: [place];  Service: General;  Laterality: N/A;    History reviewed. No pertinent family history.  Social History:  reports that she has never smoked. She has never used smokeless tobacco. She reports that she does not drink alcohol or use illicit drugs.  Allergies:  Allergies  Allergen Reactions  . Codeine Nausea Only and Other (See Comments)    Makes very heated     Medications: I have reviewed the patient's current medications.  Results for orders placed or performed during the hospital encounter of 05/02/15 (from the past 48 hour(s))  CBC with Differential     Status: Abnormal   Collection Time: 05/02/15  9:00 AM  Result Value Ref Range   WBC 7.1 4.0 - 10.5 K/uL   RBC 4.28 3.87 - 5.11 MIL/uL   Hemoglobin 12.4 12.0 - 15.0 g/dL   HCT 37.4 36.0 - 46.0 %   MCV 87.4 78.0 - 100.0 fL   MCH 29.0 26.0 - 34.0 pg   MCHC 33.2 30.0 - 36.0 g/dL   RDW 14.5 11.5 - 15.5 %   Platelets 154 150 - 400 K/uL   Neutrophils Relative % 80 (H) 43 - 77 %   Neutro Abs 5.7 1.7 -  7.7 K/uL   Lymphocytes Relative 15 12 - 46 %   Lymphs Abs 1.1 0.7 - 4.0 K/uL   Monocytes Relative 5 3 - 12 %   Monocytes Absolute 0.3 0.1 - 1.0 K/uL   Eosinophils Relative 0 0 - 5 %   Eosinophils Absolute 0.0 0.0 - 0.7 K/uL   Basophils Relative 0 0 - 1 %   Basophils Absolute 0.0 0.0 - 0.1 K/uL  Basic metabolic panel     Status: Abnormal   Collection Time: 05/02/15  9:00 AM  Result Value Ref Range   Sodium 138 135 - 145 mmol/L   Potassium 4.1 3.5 - 5.1 mmol/L   Chloride 104 101 - 111 mmol/L   CO2 20 (L) 22 - 32 mmol/L   Glucose, Bld 90 65 - 99 mg/dL   BUN 15 6 - 20 mg/dL   Creatinine, Ser 0.85 0.44 - 1.00 mg/dL   Calcium 9.0 8.9 - 10.3 mg/dL   GFR calc non Af Amer >60 >60 mL/min   GFR calc Af Amer >60 >60 mL/min    Comment: (NOTE) The eGFR has been calculated using the CKD EPI equation. This calculation has not been validated in all clinical situations. eGFR's persistently <60 mL/min signify possible Chronic Kidney Disease.    Anion gap 14 5 - 15  Ct Head Wo Contrast  05/02/2015   CLINICAL DATA:  Syncopal episode earlier today resulting in a fall hitting head and face. Lip laceration with facial trauma. Neck pain.  EXAM: CT HEAD WITHOUT CONTRAST  CT MAXILLOFACIAL WITHOUT CONTRAST  CT CERVICAL SPINE WITHOUT CONTRAST  TECHNIQUE: Multidetector CT imaging of the head, cervical spine, and maxillofacial structures were performed using the standard protocol without intravenous contrast. Multiplanar CT image reconstructions of the cervical spine and maxillofacial structures were also generated.  COMPARISON:  CT head 03/03/2012.  FINDINGS: CT HEAD FINDINGS  No evidence for acute infarction, hemorrhage, mass lesion, hydrocephalus, or extra-axial fluid. No atrophy or white matter disease. Intact calvarium. No acute sinus or mastoid disease.  CT MAXILLOFACIAL FINDINGS  There is a pure LeFort 1 type fracture across the base of the BILATERAL maxillary sinuses. Maxillary sinus air-fluid levels  representing blood are present. Nasal turbinate hypertrophy and nasal cavity hemorrhage is evident. Nasal septal fracture with RIGHT-to-LEFT deviation. The lateral and medial pterygoid plates appear intact. There is no mandibular fracture or TMJ dislocation. No frontal or sphenoid sinus air-fluid level. Minor BILATERAL ethmoid sinus fluid. No posttraumatic missing teeth. Multiple prior root canals. Periodontal disease.  No other facial fractures. Zygoma is intact. Nasal bone intact. Orbital walls intact. Air from the LEFT maxillary sinus enters the infratemporal fossa and dissects upward along the LEFT temporalis muscle but there is no orbital emphysema. Globes are intact. Facial soft tissue swelling without visible foreign body or laceration.  CT CERVICAL SPINE FINDINGS  There is no visible cervical spine fracture, traumatic subluxation, prevertebral soft tissue swelling, or intraspinal hematoma. There is mild straightening of the normal cervical lordosis. There is a moderate size calcified disc extrusion at C4-C5 which is  not posttraumatic. Smaller calcified disc protrusion C3-4 also pre-existing. Airway midline. No significant atherosclerosis. No neck masses no lung apex lesion.  IMPRESSION: No acute intracranial findings.  There is a pure LeFort 1 type fracture across the base of the maxillary sinuses and maxilla. BILATERAL maxillary sinus air-fluid levels are present without corresponding blowout injury.  No cervical spine fracture or traumatic subluxation. Cervical spondylosis most notable at C4-C5.   Electronically Signed   By: Staci Righter M.D.   On: 05/02/2015 10:10   Ct Cervical Spine Wo Contrast  05/02/2015   CLINICAL DATA:  Syncopal episode earlier today resulting in a fall hitting head and face. Lip laceration with facial trauma. Neck pain.  EXAM: CT HEAD WITHOUT CONTRAST  CT MAXILLOFACIAL WITHOUT CONTRAST  CT CERVICAL SPINE WITHOUT CONTRAST  TECHNIQUE: Multidetector CT imaging of the head,  cervical spine, and maxillofacial structures were performed using the standard protocol without intravenous contrast. Multiplanar CT image reconstructions of the cervical spine and maxillofacial structures were also generated.  COMPARISON:  CT head 03/03/2012.  FINDINGS: CT HEAD FINDINGS  No evidence for acute infarction, hemorrhage, mass lesion, hydrocephalus, or extra-axial fluid. No atrophy or white matter disease. Intact calvarium. No acute sinus or mastoid disease.  CT MAXILLOFACIAL FINDINGS  There is a pure LeFort 1 type fracture across the base of the BILATERAL maxillary sinuses. Maxillary sinus air-fluid levels representing blood are present. Nasal turbinate hypertrophy and nasal cavity hemorrhage is evident. Nasal septal fracture with RIGHT-to-LEFT deviation. The lateral and medial pterygoid plates appear intact. There is no mandibular fracture or TMJ dislocation. No frontal or sphenoid sinus air-fluid level. Minor BILATERAL ethmoid sinus fluid. No posttraumatic missing teeth. Multiple prior root canals. Periodontal disease.  No other facial fractures. Zygoma is intact.  Nasal bone intact. Orbital walls intact. Air from the LEFT maxillary sinus enters the infratemporal fossa and dissects upward along the LEFT temporalis muscle but there is no orbital emphysema. Globes are intact. Facial soft tissue swelling without visible foreign body or laceration.  CT CERVICAL SPINE FINDINGS  There is no visible cervical spine fracture, traumatic subluxation, prevertebral soft tissue swelling, or intraspinal hematoma. There is mild straightening of the normal cervical lordosis. There is a moderate size calcified disc extrusion at C4-C5 which is  not posttraumatic. Smaller calcified disc protrusion C3-4 also pre-existing. Airway midline. No significant atherosclerosis. No neck masses no lung apex lesion.  IMPRESSION: No acute intracranial findings.  There is a pure LeFort 1 type fracture across the base of the maxillary  sinuses and maxilla. BILATERAL maxillary sinus air-fluid levels are present without corresponding blowout injury.  No cervical spine fracture or traumatic subluxation. Cervical spondylosis most notable at C4-C5.   Electronically Signed   By: Staci Righter M.D.   On: 05/02/2015 10:10   Mr Cervical Spine Wo Contrast  05/02/2015   CLINICAL DATA:  Dizziness and syncope. Fall. Right arm pain and tingling.  EXAM: MRI CERVICAL SPINE WITHOUT CONTRAST  TECHNIQUE: Multiplanar, multisequence MR imaging of the cervical spine was performed. No intravenous contrast was administered.  COMPARISON:  None.  FINDINGS: Motion degraded study which could obscure subtle or small pathology.  No indication of occult fracture. No evidence of ligamentous insufficiency. Normal cord signal and morphology. No spinal canal hematoma.  An incidental hemangioma is noted in the upper C5 body.  No extra-spinal findings to explain. Normal flow related signal loss in the bilateral cervical and carotid arteries.  Degenerative changes:  C2-3: Unremarkable.  C3-4: Mild uncovertebral spurring on the right with early foraminal stenosis.  C4-5: Degenerative disc bulging with superimposed central protrusion which contacts the ventral cord. Uncovertebral spurs and disc causes bilateral foraminal stenosis. Grading is limited by patient motion  C5-6: Circumferential bulging of disc, effacing the ventral CSF. Bilateral uncovertebral spurs with likely mild foraminal stenosis  C6-7: Disc narrowing and circumferential bulging. Uncovertebral spur and disc in the foramina cause advanced bilateral foraminal stenosis. Near complete effacement of canal CSF without cord compression. This is consistent with mild spinal canal stenosis  C7-T1:Mild facet arthropathy on the left.  No impingement  IMPRESSION: 1. Mildly motion degraded study. 2. No occult fracture, ligament tear, or cord injury. 3. Mid and lower cervical degenerative disc disease, as above, with advanced  bilateral C4-5 and C6-7 foraminal stenosis.   Electronically Signed   By: Monte Fantasia M.D.   On: 05/02/2015 13:01   Dg Chest Port 1 View  05/02/2015   CLINICAL DATA:  Dizziness and syncopal episode  EXAM: PORTABLE CHEST - 1 VIEW  COMPARISON:  07/07/2014  FINDINGS: Cardiac shadow is mildly enlarged but stable. Some fullness is noted in the right peritracheal region likely related to the portable technique and stable from the prior exam. The lungs are clear bilaterally. No bony abnormality is seen.  IMPRESSION: No acute abnormality noted.   Electronically Signed   By: Inez Catalina M.D.   On: 05/02/2015 08:56   Ct Maxillofacial Wo Cm  05/02/2015   CLINICAL DATA:  Syncopal episode earlier today resulting in a fall hitting head and face. Lip laceration with facial trauma. Neck pain.  EXAM: CT HEAD WITHOUT CONTRAST  CT MAXILLOFACIAL WITHOUT CONTRAST  CT CERVICAL SPINE WITHOUT CONTRAST  TECHNIQUE: Multidetector CT imaging of the head, cervical spine, and maxillofacial structures  were performed using the standard protocol without intravenous contrast. Multiplanar CT image reconstructions of the cervical spine and maxillofacial structures were also generated.  COMPARISON:  CT head 03/03/2012.  FINDINGS: CT HEAD FINDINGS  No evidence for acute infarction, hemorrhage, mass lesion, hydrocephalus, or extra-axial fluid. No atrophy or white matter disease. Intact calvarium. No acute sinus or mastoid disease.  CT MAXILLOFACIAL FINDINGS  There is a pure LeFort 1 type fracture across the base of the BILATERAL maxillary sinuses. Maxillary sinus air-fluid levels representing blood are present. Nasal turbinate hypertrophy and nasal cavity hemorrhage is evident. Nasal septal fracture with RIGHT-to-LEFT deviation. The lateral and medial pterygoid plates appear intact. There is no mandibular fracture or TMJ dislocation. No frontal or sphenoid sinus air-fluid level. Minor BILATERAL ethmoid sinus fluid. No posttraumatic missing  teeth. Multiple prior root canals. Periodontal disease.  No other facial fractures. Zygoma is intact. Nasal bone intact. Orbital walls intact. Air from the LEFT maxillary sinus enters the infratemporal fossa and dissects upward along the LEFT temporalis muscle but there is no orbital emphysema. Globes are intact. Facial soft tissue swelling without visible foreign body or laceration.  CT CERVICAL SPINE FINDINGS  There is no visible cervical spine fracture, traumatic subluxation, prevertebral soft tissue swelling, or intraspinal hematoma. There is mild straightening of the normal cervical lordosis. There is a moderate size calcified disc extrusion at C4-C5 which is  not posttraumatic. Smaller calcified disc protrusion C3-4 also pre-existing. Airway midline. No significant atherosclerosis. No neck masses no lung apex lesion.  IMPRESSION: No acute intracranial findings.  There is a pure LeFort 1 type fracture across the base of the maxillary sinuses and maxilla. BILATERAL maxillary sinus air-fluid levels are present without corresponding blowout injury.  No cervical spine fracture or traumatic subluxation. Cervical spondylosis most notable at C4-C5.   Electronically Signed   By: Staci Righter M.D.   On: 05/02/2015 10:10    ROS Blood pressure 105/61, pulse 80, temperature 97.5 F (36.4 C), temperature source Oral, resp. rate 18, last menstrual period 06/16/2014, SpO2 96 %. Physical Exam  Constitutional: She appears well-developed.  HENT:  Head: Normocephalic.  Nose: Nose normal.  Nose is without evidence of trauma in septum is midline. The teeth have 1 broken tooth and she has slight malocclusion. The midface is clearly freely mobile by palpation and manipulation. There is a lip laceration of the lower lip that is already repaired. Neck is without swelling or mass  Eyes: Conjunctivae and EOM are normal. Pupils are equal, round, and reactive to light.  Neck: Neck supple.    Assessment/Plan: LeFort I  fracture-clearly by examination her midface is completely mobile. This will need to be repaired with open reduction internal fixation. She also will need to have maxillary mandibular fixation for a short time and then a soft diet. We discussed this procedure including risks, benefits, and options. All her questions are answered and consent was obtained. I will check with neurosurgery as well as trauma and see if scheduling this tomorrow or Sunday would be reasonable from a medical perspective.  Melissa Montane 05/02/2015, 6:07 PM

## 2015-05-02 NOTE — ED Notes (Signed)
Attempt to call report x 1  

## 2015-05-02 NOTE — ED Notes (Signed)
Pt has returned from being out of the department; pt placed on continuous pulse oximetry and blood pressure cuff; visitors at bedside

## 2015-05-02 NOTE — ED Notes (Signed)
Per EMS - pt showering this morning, began to feel dizzy and had syncopal episode. Pt was on hands and knees, family member helped her walk to couch. Pt has been on low carb diet x several weeks but has been on this diet before without any issues. Initial BP 110/74. Given 300cc fluid. Last VS - BP 141/80, NSR, CBG 114. Pt c/o arm pain and tingling, worse in right arm; right arm tender to palpation. Pain across shoulder blades and initially in neck (C-Collar placed by medic). IV 22G left hand. Trauma to face.

## 2015-05-02 NOTE — Evaluation (Signed)
Physical Therapy Evaluation Patient Details Name: Heather Hamilton MRN: [id number] DOB: 13-Jun-1966 Today's Date: 05/02/2015   History of Present Illness  The patient is a 49 year old white female who by report had a syncopal event while in the shower. She struck the floor and lacerated her face. She was worked up with a cervical MRI which demonstrated some cervical stenosis. Also found to have LeFort I fracture.  Clinical Impression  Pt admitted with the above complications. Pt currently with functional limitations due to the deficits listed below (see PT Problem List). Very lethargic during evaluation, reports "lightheadeness" symptoms have been ongoing for approx 2 years however has not had a syncopal episode until now. Limited use of BIL UEs, with hyperesthesia to light touch. Moderate sway noted, ambulating up to 5 feet with min assist to maintain balance. Lightheadedness still present when OOB. Recommend orthostatic vitals be taken when pt can tolerate standing for longer period of time. May benefit from CIR to improve safety and functional independence prior to returning home. Will have 24 hour supervision from family as needed. Will continue to monitor and progress, updating d/c recommendations as appropriate. Pt will benefit from skilled PT to increase their independence and safety with mobility to allow discharge to the venue listed below.       Follow Up Recommendations CIR    Equipment Recommendations   (TBD based on progression)    Recommendations for Other Services Rehab consult;OT consult     Precautions / Restrictions Precautions Precautions: Fall Precaution Comments: UEs very sensitive Restrictions Weight Bearing Restrictions: No      Mobility  Bed Mobility Overal bed mobility: Needs Assistance Bed Mobility: Rolling;Sidelying to Sit Rolling: Min assist Sidelying to sit: Min assist       General bed mobility comments: Min assist for facilitation with rolling onto onto  Lt side with cues for technique. Min assist to rise from sidelying for truncal support. Able to use Lt shoulder fairly well. Reports dizziness upon sitting  Transfers Overall transfer level: Needs assistance Equipment used: Rolling walker (2 wheeled) Transfers: Sit to/from Stand Sit to Stand: Min assist         General transfer comment: VC for hand placement with limited but some use. Min assist for balance upon standing, improves with RW for support. Pt nodding off, quite lethargic with intermittent bouts of alertness. Fair stability when alert.   Ambulation/Gait Ambulation/Gait assistance: Min assist Ambulation Distance (Feet): 5 Feet Assistive device: Rolling walker (2 wheeled) Gait Pattern/deviations: Step-through pattern;Decreased stride length;Drifts right/left Gait velocity: slow Gait velocity interpretation: Below normal speed for age/gender General Gait Details: Able to ambulate several steps towards chair with min assist for balance and walker placement. Able to grip walker and push appropriately but needs some assist with turns. Pt with moderate amount of sway but improves with instructed to keep eyes open and remain engaged with task at hand. Reports dizziness  Stairs            Wheelchair Mobility    Modified Rankin (Stroke Patients Only)       Balance Overall balance assessment: Needs assistance;History of Falls Sitting-balance support: No upper extremity supported Sitting balance-Leahy Scale: Good     Standing balance support: Bilateral upper extremity supported Standing balance-Leahy Scale: Poor                               Pertinent Vitals/Pain Pain Assessment: Faces Faces Pain Scale: Hurts even more  Pain Location: face, arms Pain Descriptors / Indicators: Grimacing;Guarding Pain Intervention(s): Monitored during session;Repositioned    Home Living Family/patient expects to be discharged to:: Private residence Living Arrangements:  Spouse/significant other Available Help at Discharge: Family;Available 24 hours/day Type of Home: House Home Access: Stairs to enter Entrance Stairs-Rails: None Entrance Stairs-Number of Steps: 1 Home Layout: One level Home Equipment: Walker - standard      Prior Function Level of Independence: Independent         Comments: reports she has been having dizziness for a couple of years. Has seen multiple MDs including neurologist with no definite findings.     Hand Dominance   Dominant Hand: Right    Extremity/Trunk Assessment   Upper Extremity Assessment: Defer to OT evaluation (hyperesthesia BIL Rt > Lt)           Lower Extremity Assessment: Generalized weakness (Difficult to assess due to level of arousal)         Communication   Communication: Other (comment) (soft spoken due to injury to maxilla)  Cognition Arousal/Alertness: Lethargic Behavior During Therapy: Flat affect Overall Cognitive Status: Within Functional Limits for tasks assessed                      General Comments General comments (skin integrity, edema, etc.): UEs hyperesthetic to light touch. Family present and very supportive. Discussed role for PT, safety with mobility, expected outcomes, and progression with therapy while admitted.  HR maintained approx 90s-100 during bout. Did report "lightheadedness" throughout session. BP not assessed in standing due to balance and lethargy    Exercises        Assessment/Plan    PT Assessment Patient needs continued PT services  PT Diagnosis Difficulty walking;Abnormality of gait;Generalized weakness;Acute pain   PT Problem List Decreased strength;Decreased range of motion;Decreased activity tolerance;Decreased balance;Decreased mobility;Decreased coordination;Decreased knowledge of use of DME;Impaired sensation;Pain  PT Treatment Interventions DME instruction;Gait training;Stair training;Functional mobility training;Therapeutic  activities;Therapeutic exercise;Balance training;Neuromuscular re-education;Patient/family education;Modalities   PT Goals (Current goals can be found in the Care Plan section) Acute Rehab PT Goals Patient Stated Goal: none stated PT Goal Formulation: With patient Time For Goal Achievement: 05/16/15 Potential to Achieve Goals: Good    Frequency Min 4X/week   Barriers to discharge        Co-evaluation               End of Session Equipment Utilized During Treatment: Gait belt Activity Tolerance: Patient limited by lethargy;Patient limited by pain Patient left: in chair;with call bell/phone within reach;with family/visitor present Nurse Communication: Mobility status;Other (comment) (Consider orthostatic testing)         Time: 8119-1478 PT Time Calculation (min) (ACUTE ONLY): 32 min   Charges:   PT Evaluation $Initial PT Evaluation Tier I: 1 Procedure PT Treatments $Therapeutic Activity: 8-22 mins   PT G Codes:        Berton Mount 05/02/2015, 8:03 PM  [place], [place] [phone]

## 2015-05-02 NOTE — ED Provider Notes (Signed)
CSN: [id number]     Arrival date & time 05/02/15  1914 History   First MD Initiated Contact with Patient 05/02/15 [phone]     Chief Complaint  Patient presents with  . Fall     (Consider location/radiation/quality/duration/timing/severity/associated sxs/prior Treatment) HPI   Blood pressure 124/67, pulse 76, temperature 98.6 F (37 C), temperature source Oral, resp. rate 22, last menstrual period 06/16/2014, SpO2 98 %.  Heather Hamilton is a 49 y.o. female BIBEMS complaining of severe posterior C-spine pain associated with bilateral upper extremity lancinating paresthesia pain status post syncopal event while in the shower this morning. Patient states that she felt lightheaded, when she woke up she was outside the Tub. Her stepdaughter found her, she was ambulatory on scene, EMS was called. Patient denies chest pain, shortness of breath, abdominal pain. She rates her pain at 10 out of 10, , to exacerbated by movement and palpation, rigid c-collar in place by EMS. Patient's lacerations to lips, states her last tetanus shot was within the last year. Patient states that she felt dizzy because she's been on a low carb diet, she's been drinking normally and staying well hydrated.   Past Medical History  Diagnosis Date  . Hypertension   . GERD (gastroesophageal reflux disease)    Past Surgical History  Procedure Laterality Date  . Cesarean section    . Cholecystectomy N/A 07/09/2014    Procedure: LAPAROSCOPIC CHOLECYSTECTOMY;  Surgeon: Liz Malady, MD;  Location: [facility] OR;  Service: General;  Laterality: N/A;   History reviewed. No pertinent family history. History  Substance Use Topics  . Smoking status: Never Smoker   . Smokeless tobacco: Never Used  . Alcohol Use: No   OB History    No data available     Review of Systems  10 systems reviewed and found to be negative, except as noted in the HPI.   Allergies  Codeine  Home Medications   Prior to Admission medications     Medication Sig Start Date End Date Taking? Authorizing Provider  furosemide (LASIX) 20 MG tablet Take 20 mg by mouth daily. 06/10/14  Yes Historical Provider, MD  lisinopril (PRINIVIL,ZESTRIL) 20 MG tablet Take 20 mg by mouth daily.   Yes Historical Provider, MD  methocarbamol (ROBAXIN) 500 MG tablet Take 1 tablet (500 mg total) by mouth 2 (two) times daily. Patient not taking: Reported on 05/02/2015 03/30/15   Fayrene Helper, PA-C  naproxen (NAPROSYN) 500 MG tablet Take 1 tablet (500 mg total) by mouth 2 (two) times daily as needed for moderate pain. Patient not taking: Reported on 05/02/2015 03/30/15   Fayrene Helper, PA-C  oxyCODONE (OXY IR/ROXICODONE) 5 MG immediate release tablet Take 1 tablet (5 mg total) by mouth every 6 (six) hours as needed (pain). Patient not taking: Reported on 03/30/2015 07/10/14   Jeralyn Bennett, MD   BP 111/66 mmHg  Pulse 79  Temp(Src) 98.6 F (37 C) (Oral)  Resp 16  SpO2 97%  LMP 06/16/2014 Physical Exam  Constitutional: She is oriented to person, place, and time. She appears well-developed and well-nourished. No distress.  HENT:  Head: Normocephalic.    Mouth/Throat:    b/l blood in Nares, when the blood is cleaned, the septum appears to be midline, there is no septal hematomas, source of blood cannot be found after nares are evacuated bilaterally.  There is no tenderness to palpation along the bilateral orbital rims, extraocular movement is intact without plan or diplopia.  Left lower lip laceartion ~ 3cm,  through [place] border a large flap extending to the posterior mucous membrane.   Patient also has 2 cm non jagged non- through and through lacertion to left chin.   Eyes: Conjunctivae and EOM are normal. Pupils are equal, round, and reactive to light.  Neck:  Rigid C-collar in place, not removed.   Bilateral UE 4/5 grip, bicep and triceps.  Diffuse, severe B/L UE pain. No deformity.   Cardiovascular: Normal rate, regular rhythm and intact distal  pulses.   Pulmonary/Chest: Effort normal and breath sounds normal. No stridor. No respiratory distress. She has no wheezes. She has no rales. She exhibits no tenderness.  Abdominal: Soft. Bowel sounds are normal. She exhibits no distension and no mass. There is no tenderness. There is no rebound and no guarding.  Musculoskeletal: Normal range of motion. She exhibits tenderness. She exhibits no edema.  Neurological: She is alert and oriented to person, place, and time.  Psychiatric:  Acutely agitated, crying  Nursing note and vitals reviewed.   ED Course  LACERATION REPAIR Date/Time: 05/02/2015 2:21 PM Performed by: Wynetta Emery Authorized by: Wynetta Emery Consent: Verbal consent obtained. Risks and benefits: risks, benefits and alternatives were discussed Consent given by: patient Patient identity confirmed: verbally with patient Body area: mouth Location details: lower lip, interior Laceration length: 3 cm Vascular damage: no Anesthesia: nerve block (Mental nerve block) Local anesthetic: bupivacaine 0.5% without epinephrine Anesthetic total: 2 ml Preparation: Patient was prepped and draped in the usual sterile fashion. Irrigation solution: saline Irrigation method: syringe Amount of cleaning: extensive Debridement: moderate Degree of undermining: minimal Wound skin closure material used: Outer layer of is a coordinated in combination of 1x corner stich and 4x simple interrupted stitches using 7-0 prolene, the inner layer is closed with 1 deep simple interupted with  4-0 vicryl  Rapide; mucous membrane is closed with 4-0  Rapide running SQ. Approximation: close Approximation difficulty: complex Dressing: antibiotic ointment Patient tolerance: Patient tolerated the procedure well with no immediate complications   (including critical care time)  LACERATION REPAIR Date/Time: 05/02/2015 2:21 PM Performed by: Wynetta Emery Authorized by: Wynetta Emery Consent:  Verbal consent obtained. Risks and benefits: risks, benefits and alternatives were discussed Consent given by: patient Patient identity confirmed: verbally with patient Body area: mouth Location details: left lower lip, interior and on the vermilion border Laceration length: 2 cm Vascular damage: no Anesthesia: nerve block (Mental nerve block) Local anesthetic: bupivacaine 0.5% without epinephrine Anesthetic total: 2 ml Preparation: Patient was prepped and draped in the usual sterile fashion. Irrigation solution: saline Irrigation method: syringe Amount of cleaning: extensive Debridement: None Degree of undermining: minimal Wound skin closure material used: 7-0 proline, 4 simple interrupted  Approximation difficulty: simple Dressing: antibiotic ointment Patient tolerance: Patient tolerated the procedure well with no immediate complications   Labs Review Labs Reviewed  CBC WITH DIFFERENTIAL/PLATELET - Abnormal; Notable for the following:    Neutrophils Relative % 80 (*)    All other components within normal limits  BASIC METABOLIC PANEL - Abnormal; Notable for the following:    CO2 20 (*)    All other components within normal limits    Imaging Review Ct Head Wo Contrast  05/02/2015   CLINICAL DATA:  Syncopal episode earlier today resulting in a fall hitting head and face. Lip laceration with facial trauma. Neck pain.  EXAM: CT HEAD WITHOUT CONTRAST  CT MAXILLOFACIAL WITHOUT CONTRAST  CT CERVICAL SPINE WITHOUT CONTRAST  TECHNIQUE: Multidetector CT imaging of the head, cervical spine, and maxillofacial  structures were performed using the standard protocol without intravenous contrast. Multiplanar CT image reconstructions of the cervical spine and maxillofacial structures were also generated.  COMPARISON:  CT head 03/03/2012.  FINDINGS: CT HEAD FINDINGS  No evidence for acute infarction, hemorrhage, mass lesion, hydrocephalus, or extra-axial fluid. No atrophy or white matter disease.  Intact calvarium. No acute sinus or mastoid disease.  CT MAXILLOFACIAL FINDINGS  There is a pure LeFort 1 type fracture across the base of the BILATERAL maxillary sinuses. Maxillary sinus air-fluid levels representing blood are present. Nasal turbinate hypertrophy and nasal cavity hemorrhage is evident. Nasal septal fracture with RIGHT-to-LEFT deviation. The lateral and medial pterygoid plates appear intact. There is no mandibular fracture or TMJ dislocation. No frontal or sphenoid sinus air-fluid level. Minor BILATERAL ethmoid sinus fluid. No posttraumatic missing teeth. Multiple prior root canals. Periodontal disease.  No other facial fractures. Zygoma is intact. Nasal bone intact. Orbital walls intact. Air from the LEFT maxillary sinus enters the infratemporal fossa and dissects upward along the LEFT temporalis muscle but there is no orbital emphysema. Globes are intact. Facial soft tissue swelling without visible foreign body or laceration.  CT CERVICAL SPINE FINDINGS  There is no visible cervical spine fracture, traumatic subluxation, prevertebral soft tissue swelling, or intraspinal hematoma. There is mild straightening of the normal cervical lordosis. There is a moderate size calcified disc extrusion at C4-C5 which is  not posttraumatic. Smaller calcified disc protrusion C3-4 also pre-existing. Airway midline. No significant atherosclerosis. No neck masses no lung apex lesion.  IMPRESSION: No acute intracranial findings.  There is a pure LeFort 1 type fracture across the base of the maxillary sinuses and maxilla. BILATERAL maxillary sinus air-fluid levels are present without corresponding blowout injury.  No cervical spine fracture or traumatic subluxation. Cervical spondylosis most notable at C4-C5.   Electronically Signed   By: Elsie Stain M.D.   On: 05/02/2015 10:10   Ct Cervical Spine Wo Contrast  05/02/2015   CLINICAL DATA:  Syncopal episode earlier today resulting in a fall hitting head and face.  Lip laceration with facial trauma. Neck pain.  EXAM: CT HEAD WITHOUT CONTRAST  CT MAXILLOFACIAL WITHOUT CONTRAST  CT CERVICAL SPINE WITHOUT CONTRAST  TECHNIQUE: Multidetector CT imaging of the head, cervical spine, and maxillofacial structures were performed using the standard protocol without intravenous contrast. Multiplanar CT image reconstructions of the cervical spine and maxillofacial structures were also generated.  COMPARISON:  CT head 03/03/2012.  FINDINGS: CT HEAD FINDINGS  No evidence for acute infarction, hemorrhage, mass lesion, hydrocephalus, or extra-axial fluid. No atrophy or white matter disease. Intact calvarium. No acute sinus or mastoid disease.  CT MAXILLOFACIAL FINDINGS  There is a pure LeFort 1 type fracture across the base of the BILATERAL maxillary sinuses. Maxillary sinus air-fluid levels representing blood are present. Nasal turbinate hypertrophy and nasal cavity hemorrhage is evident. Nasal septal fracture with RIGHT-to-LEFT deviation. The lateral and medial pterygoid plates appear intact. There is no mandibular fracture or TMJ dislocation. No frontal or sphenoid sinus air-fluid level. Minor BILATERAL ethmoid sinus fluid. No posttraumatic missing teeth. Multiple prior root canals. Periodontal disease.  No other facial fractures. Zygoma is intact. Nasal bone intact. Orbital walls intact. Air from the LEFT maxillary sinus enters the infratemporal fossa and dissects upward along the LEFT temporalis muscle but there is no orbital emphysema. Globes are intact. Facial soft tissue swelling without visible foreign body or laceration.  CT CERVICAL SPINE FINDINGS  There is no visible cervical spine fracture, traumatic subluxation, prevertebral  soft tissue swelling, or intraspinal hematoma. There is mild straightening of the normal cervical lordosis. There is a moderate size calcified disc extrusion at C4-C5 which is  not posttraumatic. Smaller calcified disc protrusion C3-4 also pre-existing.  Airway midline. No significant atherosclerosis. No neck masses no lung apex lesion.  IMPRESSION: No acute intracranial findings.  There is a pure LeFort 1 type fracture across the base of the maxillary sinuses and maxilla. BILATERAL maxillary sinus air-fluid levels are present without corresponding blowout injury.  No cervical spine fracture or traumatic subluxation. Cervical spondylosis most notable at C4-C5.   Electronically Signed   By: Elsie Stain M.D.   On: 05/02/2015 10:10   Mr Cervical Spine Wo Contrast  05/02/2015   CLINICAL DATA:  Dizziness and syncope. Fall. Right arm pain and tingling.  EXAM: MRI CERVICAL SPINE WITHOUT CONTRAST  TECHNIQUE: Multiplanar, multisequence MR imaging of the cervical spine was performed. No intravenous contrast was administered.  COMPARISON:  None.  FINDINGS: Motion degraded study which could obscure subtle or small pathology.  No indication of occult fracture. No evidence of ligamentous insufficiency. Normal cord signal and morphology. No spinal canal hematoma.  An incidental hemangioma is noted in the upper C5 body.  No extra-spinal findings to explain. Normal flow related signal loss in the bilateral cervical and carotid arteries.  Degenerative changes:  C2-3: Unremarkable.  C3-4: Mild uncovertebral spurring on the right with early foraminal stenosis.  C4-5: Degenerative disc bulging with superimposed central protrusion which contacts the ventral cord. Uncovertebral spurs and disc causes bilateral foraminal stenosis. Grading is limited by patient motion  C5-6: Circumferential bulging of disc, effacing the ventral CSF. Bilateral uncovertebral spurs with likely mild foraminal stenosis  C6-7: Disc narrowing and circumferential bulging. Uncovertebral spur and disc in the foramina cause advanced bilateral foraminal stenosis. Near complete effacement of canal CSF without cord compression. This is consistent with mild spinal canal stenosis  C7-T1:Mild facet arthropathy on the  left.  No impingement  IMPRESSION: 1. Mildly motion degraded study. 2. No occult fracture, ligament tear, or cord injury. 3. Mid and lower cervical degenerative disc disease, as above, with advanced bilateral C4-5 and C6-7 foraminal stenosis.   Electronically Signed   By: Marnee Spring M.D.   On: 05/02/2015 13:01   Dg Chest Port 1 View  05/02/2015   CLINICAL DATA:  Dizziness and syncopal episode  EXAM: PORTABLE CHEST - 1 VIEW  COMPARISON:  07/07/2014  FINDINGS: Cardiac shadow is mildly enlarged but stable. Some fullness is noted in the right peritracheal region likely related to the portable technique and stable from the prior exam. The lungs are clear bilaterally. No bony abnormality is seen.  IMPRESSION: No acute abnormality noted.   Electronically Signed   By: Alcide Clever M.D.   On: 05/02/2015 08:56   Ct Maxillofacial Wo Cm  05/02/2015   CLINICAL DATA:  Syncopal episode earlier today resulting in a fall hitting head and face. Lip laceration with facial trauma. Neck pain.  EXAM: CT HEAD WITHOUT CONTRAST  CT MAXILLOFACIAL WITHOUT CONTRAST  CT CERVICAL SPINE WITHOUT CONTRAST  TECHNIQUE: Multidetector CT imaging of the head, cervical spine, and maxillofacial structures were performed using the standard protocol without intravenous contrast. Multiplanar CT image reconstructions of the cervical spine and maxillofacial structures were also generated.  COMPARISON:  CT head 03/03/2012.  FINDINGS: CT HEAD FINDINGS  No evidence for acute infarction, hemorrhage, mass lesion, hydrocephalus, or extra-axial fluid. No atrophy or white matter disease. Intact calvarium. No acute sinus or  mastoid disease.  CT MAXILLOFACIAL FINDINGS  There is a pure LeFort 1 type fracture across the base of the BILATERAL maxillary sinuses. Maxillary sinus air-fluid levels representing blood are present. Nasal turbinate hypertrophy and nasal cavity hemorrhage is evident. Nasal septal fracture with RIGHT-to-LEFT deviation. The lateral and  medial pterygoid plates appear intact. There is no mandibular fracture or TMJ dislocation. No frontal or sphenoid sinus air-fluid level. Minor BILATERAL ethmoid sinus fluid. No posttraumatic missing teeth. Multiple prior root canals. Periodontal disease.  No other facial fractures. Zygoma is intact. Nasal bone intact. Orbital walls intact. Air from the LEFT maxillary sinus enters the infratemporal fossa and dissects upward along the LEFT temporalis muscle but there is no orbital emphysema. Globes are intact. Facial soft tissue swelling without visible foreign body or laceration.  CT CERVICAL SPINE FINDINGS  There is no visible cervical spine fracture, traumatic subluxation, prevertebral soft tissue swelling, or intraspinal hematoma. There is mild straightening of the normal cervical lordosis. There is a moderate size calcified disc extrusion at C4-C5 which is  not posttraumatic. Smaller calcified disc protrusion C3-4 also pre-existing. Airway midline. No significant atherosclerosis. No neck masses no lung apex lesion.  IMPRESSION: No acute intracranial findings.  There is a pure LeFort 1 type fracture across the base of the maxillary sinuses and maxilla. BILATERAL maxillary sinus air-fluid levels are present without corresponding blowout injury.  No cervical spine fracture or traumatic subluxation. Cervical spondylosis most notable at C4-C5.   Electronically Signed   By: Elsie Stain M.D.   On: 05/02/2015 10:10     EKG Interpretation   Date/Time:  Friday May 02 2015 08:41:56 EDT Ventricular Rate:  88 PR Interval:    QRS Duration: 89 QT Interval:  373 QTC Calculation: 451 R Axis:   -27 Text Interpretation:  Normal sinus rhythm Borderline left axis deviation  Low voltage, precordial leads since last tracing no significant change  Abnormal ekg Reconfirmed by MILLER  MD, BRIAN ([id number]) on 05/02/2015 9:32:31  AM      MDM   Final diagnoses:  Pain  Syncope, unspecified syncope type  Facial trauma,  initial encounter  Closed Jerry Caras I fracture, initial encounter  Lip laceration, initial encounter  Chin laceration, initial encounter    Filed Vitals:   05/02/15 1030 05/02/15 1100 05/02/15 1305 05/02/15 1345  BP: 105/55 100/56 111/62 111/66  Pulse: 79 81 80 79  Temp:      TempSrc:      Resp: SpO2: 94% 93% 97% 97%    Medications  HYDROmorphone (DILAUDID) injection 0.5 mg (0.5 mg Intravenous Given 05/02/15 0842)  ondansetron (ZOFRAN) injection 4 mg (4 mg Intravenous Given 05/02/15 0842)  bupivacaine (PF) (MARCAINE) 0.25 % injection 10 mL (10 mLs Infiltration Given by Other 05/02/15 0904)  LORazepam (ATIVAN) injection 1 mg (1 mg Intravenous Given 05/02/15 0949)  lidocaine-EPINEPHrine-tetracaine (LET) solution (3 mLs Topical Given by Other 05/02/15 1025)  HYDROmorphone (DILAUDID) injection 0.5 mg (0.5 mg Intravenous Given 05/02/15 1033)  HYDROmorphone (DILAUDID) injection 0.5 mg (0.5 mg Intravenous Given 05/02/15 1412)    Heather Hamilton is a pleasant 49 y.o. female presenting with cervicalgia status post syncopal event with severe, lancinating bilateral upper extremity pain and mild upper extremity weakness. Concern for spinal cord trauma versus cervical fracture. Patient remains in rigid C-spine collar. Patient also has large laceration to left lower lip and small laceration to left chin. CT head, Are negative. CT maxillofacial shows a LeFort type I. Discussed by  ENT Dr. Jearld Fenton who has been into evaluate the patient however she was in MRI.  Wounds are closed and dressed. MRI of C-spine is negative. Patient will require admission for pain control.  Will be admitted to trauma surgery, discussed with Dr. Janee Morn, he will sent trauma PA to evaluate.  This is a shared visit with the attending physician who personally evaluated the patient and agrees with the care plan.    Wynetta Emery, PA-C 05/02/15 1441  Eber Hong, MD 05/03/15 [phone]

## 2015-05-02 NOTE — ED Notes (Signed)
Admitting at bedside 

## 2015-05-02 NOTE — ED Provider Notes (Signed)
49 year old female, had syncopal episode in the shower while she got dizzy, this was a bathtub type shower, she fell, she does not remember the fall, a family member helped her get out of the bathroom and go to the couch, she complains of neck pain, bilateral severe lancinating arm pain, no lower extremity pain or weakness. On exam the patient has some blood from the bilateral nares, she has a laceration of her lower lip crossing the vermilion border as well as the lower face below the lip which is not through and through. She has no malocclusion though she does have a slight fracture of her right upper tooth, this appears to be a grade 1 fracture. She has tenderness over the cervical spine, she has normal neurologic exam of the lower extremities including strength and sensation, she has slight decreased strength of the bilateral upper extremities, she relates that she has severe neuropathic pain with any touch or with any movement of the bilateral upper extremities. There is no obvious deformities of the upper extremities, her compartments are soft and her joints are supple. Even with light touch she has hyperesthesias.  CT scan imaging of the cervical spine and maxillofacial bones and brain, EKG, labs to workup the syncopal complaint, concern for central cord syndrome or spinal fracture. Maintain cervical spine immobilization until cleared.  Medical screening examination/treatment/procedure(s) were conducted as a shared visit with non-physician practitioner(s) and myself.  I personally evaluated the patient during the encounter.  Clinical Impression:   Final diagnoses:  Pain  Syncope, unspecified syncope type  Facial trauma, initial encounter  Closed Jerry Caras I fracture, initial encounter  Lip laceration, initial encounter  Chin laceration, initial encounter         Eber Hong, MD 05/03/15 [phone]

## 2015-05-03 ENCOUNTER — Inpatient Hospital Stay (HOSPITAL_COMMUNITY): Payer: 59 | Admitting: Anesthesiology

## 2015-05-03 ENCOUNTER — Encounter (HOSPITAL_COMMUNITY): Payer: Self-pay | Admitting: Certified Registered"

## 2015-05-03 ENCOUNTER — Encounter (HOSPITAL_COMMUNITY): Admission: EM | Disposition: A | Discharge status: Home / Self Care | Payer: Self-pay | Source: Home / Self Care

## 2015-05-03 DIAGNOSIS — R55 Syncope and collapse: Secondary | ICD-10-CM

## 2015-05-03 DIAGNOSIS — I10 Essential (primary) hypertension: Secondary | ICD-10-CM

## 2015-05-03 HISTORY — PX: ORIF MANDIBULAR FRACTURE: SHX2127

## 2015-05-03 LAB — RAPID URINE DRUG SCREEN, HOSP PERFORMED
Amphetamines: NOT DETECTED
BARBITURATES: NOT DETECTED
Benzodiazepines: NOT DETECTED
Cocaine: NOT DETECTED
OPIATES: POSITIVE — AB
Tetrahydrocannabinol: NOT DETECTED

## 2015-05-03 LAB — URINALYSIS, ROUTINE W REFLEX MICROSCOPIC
Bilirubin Urine: NEGATIVE
Glucose, UA: NEGATIVE mg/dL
Ketones, ur: >80 mg/dL — AB
Leukocytes, UA: NEGATIVE
Nitrite: NEGATIVE
PROTEIN: NEGATIVE mg/dL
Specific Gravity, Urine: 1.02 (ref 1.005–1.030)
Urobilinogen, UA: 0.2 mg/dL (ref 0.0–1.0)
pH: 6 (ref 5.0–8.0)

## 2015-05-03 LAB — BASIC METABOLIC PANEL
ANION GAP: 12 (ref 5–15)
BUN: 10 mg/dL (ref 6–20)
CO2: 19 mmol/L — ABNORMAL LOW (ref 22–32)
CREATININE: 0.72 mg/dL (ref 0.44–1.00)
Calcium: 8.7 mg/dL — ABNORMAL LOW (ref 8.9–10.3)
Chloride: 105 mmol/L (ref 101–111)
GLUCOSE: 90 mg/dL (ref 65–99)
POTASSIUM: 3.8 mmol/L (ref 3.5–5.1)
Sodium: 136 mmol/L (ref 135–145)

## 2015-05-03 LAB — CBC
HEMATOCRIT: 36.9 % (ref 36.0–46.0)
HEMOGLOBIN: 12 g/dL (ref 12.0–15.0)
MCH: 29.1 pg (ref 26.0–34.0)
MCHC: 32.5 g/dL (ref 30.0–36.0)
MCV: 89.3 fL (ref 78.0–100.0)
Platelets: 182 10*3/uL (ref 150–400)
RBC: 4.13 MIL/uL (ref 3.87–5.11)
RDW: 14.9 % (ref 11.5–15.5)
WBC: 6.6 10*3/uL (ref 4.0–10.5)

## 2015-05-03 LAB — URINE MICROSCOPIC-ADD ON

## 2015-05-03 LAB — SURGICAL PCR SCREEN
MRSA, PCR: NEGATIVE
Staphylococcus aureus: NEGATIVE

## 2015-05-03 SURGERY — OPEN REDUCTION INTERNAL FIXATION (ORIF) MANDIBULAR FRACTURE
Anesthesia: General | Laterality: Bilateral

## 2015-05-03 MED ORDER — OXYCODONE HCL 5 MG/5ML PO SOLN: 5.0000 mg | Freq: Once | ORAL | Status: DC | PRN

## 2015-05-03 MED ORDER — PROPOFOL 10 MG/ML IV BOLUS
INTRAVENOUS | Status: DC | PRN
  Administered 2015-05-03: 200 mg via INTRAVENOUS

## 2015-05-03 MED ORDER — OXYMETAZOLINE HCL 0.05 % NA SOLN
NASAL | Status: DC | PRN
  Administered 2015-05-03: 1 via TOPICAL

## 2015-05-03 MED ORDER — OXYCODONE HCL 5 MG PO TABS: 5.0000 mg | ORAL_TABLET | Freq: Once | ORAL | Status: DC | PRN

## 2015-05-03 MED ORDER — BACIT-POLY-NEO HC 1 % EX OINT
TOPICAL_OINTMENT | CUTANEOUS | Status: AC
  Filled 2015-05-03: qty 15

## 2015-05-03 MED ORDER — HYDROMORPHONE HCL 1 MG/ML IJ SOLN
INTRAMUSCULAR | Status: AC
  Filled 2015-05-03: qty 1

## 2015-05-03 MED ORDER — LIDOCAINE-EPINEPHRINE 1 %-1:100000 IJ SOLN
INTRAMUSCULAR | Status: AC
  Filled 2015-05-03: qty 1

## 2015-05-03 MED ORDER — SCOPOLAMINE 1 MG/3DAYS TD PT72: 1.0000 | MEDICATED_PATCH | TRANSDERMAL | Status: DC

## 2015-05-03 MED ORDER — FENTANYL CITRATE (PF) 250 MCG/5ML IJ SOLN
INTRAMUSCULAR | Status: AC
  Filled 2015-05-03: qty 5

## 2015-05-03 MED ORDER — ONDANSETRON HCL 4 MG/2ML IJ SOLN
INTRAMUSCULAR | Status: DC | PRN
  Administered 2015-05-03: 4 mg via INTRAVENOUS

## 2015-05-03 MED ORDER — OXYMETAZOLINE HCL 0.05 % NA SOLN
NASAL | Status: AC
  Filled 2015-05-03: qty 15

## 2015-05-03 MED ORDER — CETYLPYRIDINIUM CHLORIDE 0.05 % MT LIQD: 7.0000 mL | OROMUCOSAL | Status: DC | PRN

## 2015-05-03 MED ORDER — PROPOFOL 10 MG/ML IV BOLUS
INTRAVENOUS | Status: AC
  Filled 2015-05-03: qty 20

## 2015-05-03 MED ORDER — ACETAMINOPHEN 160 MG/5ML PO SOLN
650.0000 mg | Freq: Four times a day (QID) | ORAL | Status: DC | PRN
  Administered 2015-05-03 – 2015-05-04 (×2): 650 mg via ORAL
  Filled 2015-05-03 (×2): qty 20.3

## 2015-05-03 MED ORDER — LIDOCAINE-EPINEPHRINE 1 %-1:100000 IJ SOLN
INTRAMUSCULAR | Status: DC | PRN
  Administered 2015-05-03: 2 mL

## 2015-05-03 MED ORDER — OXYMETAZOLINE HCL 0.05 % NA SOLN
NASAL | Status: DC | PRN
  Administered 2015-05-03: 2 via NASAL

## 2015-05-03 MED ORDER — MEPERIDINE HCL 25 MG/ML IJ SOLN: 6.2500 mg | INTRAMUSCULAR | Status: DC | PRN

## 2015-05-03 MED ORDER — FENTANYL CITRATE (PF) 100 MCG/2ML IJ SOLN
INTRAMUSCULAR | Status: DC | PRN
  Administered 2015-05-03: 100 ug via INTRAVENOUS
  Administered 2015-05-03: 50 ug via INTRAVENOUS

## 2015-05-03 MED ORDER — CEFAZOLIN SODIUM-DEXTROSE 2-3 GM-% IV SOLR
INTRAVENOUS | Status: DC | PRN
  Administered 2015-05-03: 2 g via INTRAVENOUS

## 2015-05-03 MED ORDER — ONDANSETRON HCL 4 MG/2ML IJ SOLN
INTRAMUSCULAR | Status: AC
  Filled 2015-05-03: qty 2

## 2015-05-03 MED ORDER — SUCCINYLCHOLINE CHLORIDE 20 MG/ML IJ SOLN
INTRAMUSCULAR | Status: DC | PRN
  Administered 2015-05-03: 100 mg via INTRAVENOUS

## 2015-05-03 MED ORDER — MIDAZOLAM HCL 2 MG/2ML IJ SOLN
INTRAMUSCULAR | Status: AC
  Filled 2015-05-03: qty 2

## 2015-05-03 MED ORDER — SCOPOLAMINE 1 MG/3DAYS TD PT72
MEDICATED_PATCH | TRANSDERMAL | Status: DC | PRN
  Administered 2015-05-03: 1 via TRANSDERMAL

## 2015-05-03 MED ORDER — HYDROMORPHONE HCL 1 MG/ML IJ SOLN
0.2500 mg | INTRAMUSCULAR | Status: DC | PRN
  Administered 2015-05-03 (×2): 0.5 mg via INTRAVENOUS

## 2015-05-03 MED ORDER — EPINEPHRINE HCL (NASAL) 0.1 % NA SOLN
NASAL | Status: AC
  Filled 2015-05-03: qty 30

## 2015-05-03 MED ORDER — MIDAZOLAM HCL 5 MG/5ML IJ SOLN
INTRAMUSCULAR | Status: DC | PRN
  Administered 2015-05-03: 1 mg via INTRAVENOUS

## 2015-05-03 MED ORDER — WHITE PETROLATUM GEL
Status: AC
  Filled 2015-05-03: qty 1

## 2015-05-03 MED ORDER — LIDOCAINE HCL (CARDIAC) 20 MG/ML IV SOLN
INTRAVENOUS | Status: DC | PRN
  Administered 2015-05-03: 50 mg via INTRAVENOUS

## 2015-05-03 MED ORDER — WHITE PETROLATUM GEL
Status: AC
  Administered 2015-05-03: 10:00:00
  Filled 2015-05-03: qty 1

## 2015-05-03 MED ORDER — LACTATED RINGERS IV SOLN
INTRAVENOUS | Status: DC | PRN
  Administered 2015-05-03: 13:00:00 via INTRAVENOUS

## 2015-05-03 SURGICAL SUPPLY — 45 items
BIT DRILL TWIST 1.4X12 (BIT) ×1
BIT DRILL TWIST 1.4X12MM (BIT) ×1 IMPLANT
BLADE 10 SAFETY STRL DISP (BLADE) ×2 IMPLANT
BLADE SURG 15 STRL LF DISP TIS (BLADE) IMPLANT
BLADE SURG 15 STRL SS (BLADE)
CANISTER SUCTION 2500CC (MISCELLANEOUS) ×2 IMPLANT
CLEANER TIP ELECTROSURG 2X2 (MISCELLANEOUS) ×2 IMPLANT
CONFORMERS SILICONE 5649 (OPHTHALMIC RELATED) IMPLANT
COVER SURGICAL LIGHT HANDLE (MISCELLANEOUS) ×4 IMPLANT
CRADLE DONUT ADULT HEAD (MISCELLANEOUS) IMPLANT
DECANTER SPIKE VIAL GLASS SM (MISCELLANEOUS) ×2 IMPLANT
DRAPE PROXIMA HALF (DRAPES) ×1 IMPLANT
DRILL BIT TWIST 1.4X12MM (BIT) ×2
ELECT COATED BLADE 2.86 ST (ELECTRODE) ×2 IMPLANT
ELECT NEEDLE BLADE 2-5/6 (NEEDLE) IMPLANT
ELECT REM PT RETURN 9FT ADLT (ELECTROSURGICAL) ×2
ELECTRODE REM PT RTRN 9FT ADLT (ELECTROSURGICAL) ×1 IMPLANT
GLOVE ECLIPSE 7.5 STRL STRAW (GLOVE) ×2 IMPLANT
GOWN STRL REUS W/ TWL LRG LVL3 (GOWN DISPOSABLE) ×2 IMPLANT
GOWN STRL REUS W/TWL LRG LVL3 (GOWN DISPOSABLE) ×4
KIT BASIN OR (CUSTOM PROCEDURE TRAY) ×2 IMPLANT
KIT ROOM TURNOVER OR (KITS) ×2 IMPLANT
NDL HYPO 25GX1X1/2 BEV (NEEDLE) IMPLANT
NEEDLE HYPO 25GX1X1/2 BEV (NEEDLE) ×2 IMPLANT
NS IRRIG 1000ML POUR BTL (IV SOLUTION) ×2 IMPLANT
PAD ARMBOARD 7.5X6 YLW CONV (MISCELLANEOUS) ×4 IMPLANT
PATTIES SURGICAL .5 X3 (DISPOSABLE) IMPLANT
PENCIL FOOT CONTROL (ELECTRODE) ×2 IMPLANT
PLATE MID FACE 6H 8MM 100D LFT (Plate) ×1 IMPLANT
PLATE MID FACE 6H 8MM 100D RT (Plate) ×1 IMPLANT
SCREW MID FACE 1.7X5MM SLF TAP (Screw) ×1 IMPLANT
SCREW MIDFACE 1.7X4MM SLF TAP (Screw) ×2 IMPLANT
SCREW MIDFACE 1.9X5MM (Screw) ×1 IMPLANT
SCREW UPPER FACE 2.0X12MM (Screw) ×4 IMPLANT
SUT CHROMIC 3 0 PS 2 (SUTURE) ×2 IMPLANT
SUT CHROMIC 4 0 PS 2 18 (SUTURE) IMPLANT
SUT ETHILON 5 0 P 3 18 (SUTURE)
SUT NYLON ETHILON 5-0 P-3 1X18 (SUTURE) IMPLANT
SUT SILK 2 0 FS (SUTURE) IMPLANT
SUT STEEL 0 (SUTURE)
SUT STEEL 0 18XMFL TIE 17 (SUTURE) IMPLANT
SUT STEEL 4 (SUTURE) ×2 IMPLANT
TOWEL OR 17X24 6PK STRL BLUE (TOWEL DISPOSABLE) ×2 IMPLANT
TRAY ENT MC OR (CUSTOM PROCEDURE TRAY) ×2 IMPLANT
WATER STERILE IRR 1000ML POUR (IV SOLUTION) IMPLANT

## 2015-05-03 NOTE — Anesthesia Preprocedure Evaluation (Signed)
Anesthesia Evaluation  Patient identified by MRN, date of birth, ID band Patient awake    Reviewed: Allergy & Precautions, NPO status , Patient's Chart, lab work & pertinent test results  Airway Mallampati: II  TM Distance: >3 FB Neck ROM: Full  Mouth opening: Limited Mouth Opening  Dental  (+) Teeth Intact, Dental Advisory Given   Pulmonary  breath sounds clear to auscultation        Cardiovascular hypertension, Pt. on medications Rhythm:Regular Rate:Normal     Neuro/Psych    GI/Hepatic GERD-  Medicated and Controlled,  Endo/Other    Renal/GU      Musculoskeletal   Abdominal   Peds  Hematology   Anesthesia Other Findings Pt had episode of syncope causing a fall resulting in her current fracture of the maxilla.  My need further work up but today her ECG appears in sinus rhythm.  Reproductive/Obstetrics                             Anesthesia Physical Anesthesia Plan  ASA: II  Anesthesia Plan: General   Post-op Pain Management:    Induction: Intravenous  Airway Management Planned: Nasal ETT  Additional Equipment:   Intra-op Plan:   Post-operative Plan: Extubation in OR  Informed Consent: I have reviewed the patients History and Physical, chart, labs and discussed the procedure including the risks, benefits and alternatives for the proposed anesthesia with the patient or authorized representative who has indicated his/her understanding and acceptance.   Dental advisory given  Plan Discussed with: CRNA, Anesthesiologist and Surgeon  Anesthesia Plan Comments:         Anesthesia Quick Evaluation

## 2015-05-03 NOTE — Transfer of Care (Signed)
Immediate Anesthesia Transfer of Care Note  Patient: Heather Hamilton  Procedure(s) Performed: Procedure(s): OPEN REDUCTION INTERNAL FIXATION (ORIF) MAXILLA FRACTURE  (Bilateral)  Patient Location: PACU  Anesthesia Type:General  Level of Consciousness: awake  Airway & Oxygen Therapy: Patient Spontanous Breathing and Patient connected to nasal cannula oxygen  Post-op Assessment: Report given to RN and Post -op Vital signs reviewed and stable  Post vital signs: Reviewed and stable  Last Vitals:  Filed Vitals:   05/03/15 1233  BP: 107/70  Pulse: 79  Temp: 36.9 C  Resp: 16    Complications: No apparent anesthesia complications

## 2015-05-03 NOTE — Progress Notes (Signed)
Spoke with Dr. Janee Morn about Lovenox for 1800.  He stated to hold it for tonight and restart tomorrow.  Called Dr. Jearld Fenton about diet order and he stated she could have full liquids.  Wire cutters at right hand side rail and husband at bedside.  Suctioned some bloody drainage from mouth.

## 2015-05-03 NOTE — Anesthesia Postprocedure Evaluation (Signed)
  Anesthesia Post-op Note  Patient: Heather Hamilton  Procedure(s) Performed: Procedure(s): OPEN REDUCTION INTERNAL FIXATION (ORIF) MAXILLA FRACTURE  (Bilateral)  Patient Location: PACU  Anesthesia Type: General   Level of Consciousness: awake, alert  and oriented  Airway and Oxygen Therapy: Patient Spontanous Breathing  Post-op Pain: mild  Post-op Assessment: Post-op Vital signs reviewed  Post-op Vital Signs: Reviewed  Last Vitals:  Filed Vitals:   05/03/15 1535  BP: 142/80  Pulse:   Temp: 37.5 C  Resp: 19    Complications: No apparent anesthesia complications

## 2015-05-03 NOTE — Op Note (Signed)
Preop/postop diagnosis: LeFort I fracture Procedure: Maxillary mandibular fixation and open reduction internal fixation of LeFort I fracture Anesthesia: Gen. Estimated blood loss: Approximately 50 mL Indications: This is a 49 year old with a injury by fall to her face and sustained a LeFort I fracture that is completely loose. She does have some malocclusion feeling but it's not significantly different area and it looks like she has a slight overbite. We discussed the procedure including risks, benefits, and options. All questions are answered and consent was obtained. Procedure: Patient was taken to the operating room and after nasal endotracheal intubation she was draped in the usual sterile manner. The bicortical screws were positioned upper and lower and and the occlusion was brought into position. She only had her anterior teeth and just a couple of molars that were very poor condition. The left to molars remaining one was to completely broken off so it did not have any occlusive surface to the upper 1. The right side had no upper occlusive surface. Putting her teeth together it look like she had it overbite based on the facet where sites. There was a very smooth contour of the canine on the bottom that fit right upper against the upper canine and seem to fit perfectly. This is where her teeth were wired using the bicortical screws. The upper gingivolabial sulcus was then opened bilaterally and the fracture sites were identified. They did not appear to be significantly out of position. They definitely did not look like the maxilla was pulled forward so again it seemed like her overbite was the proper occlusive position. Right angle plates were bent to contour along the maxilla and up onto the vertical buttress. Monocortical screws were used along the alveolus of #4 and then up in the buttress #5 self-tapping screws were used. This gave her a solid fixation. The wound was irrigated with saline. The wounds  were closed with a running 3-0 chromic. She was suctioned out of the pharynx and then an NG tube was placed to suction out the esophagus and stomach. She was then awakened brought to recovery room in stable condition counts correct

## 2015-05-03 NOTE — Consult Note (Signed)
[facility] Medical Consultation  Heather Hamilton ZOX:[id number] DOB: Nov 19, 1965 DOA: 05/02/2015 PCP: [facility] Phlegm   Requesting physician: Dr. Leonard Schwartz. Heather Hamilton Date of consultation: 05/02/2015 Reason for consultation: Syncope  Impression/Recommendations Principal Problem:   Cervical spinal stenosis Active Problems:   Fall   LeFort I fracture   Syncope   Central cord syndrome   Hypertension   Faintness  Syncope: Primary etiology likely orthostatic hypotension due to daily lasix/dehydration. No signs thus far of primary cardiac etiology. ECG without significant findings. Did have what was read as non-sustained VTach x1 overnight on telemetry, though this could likely be artifact. Also with brief period of prolonged PR interval without dropped beats. With antecedent symptoms and elevated baseline stress, vasovagal may be a consideration.   Recommendations:  - Change lasix to prn at discharge given preserved EF in 2015. Will of course need PCP follow up.  - Follow up echocardiogram - Continue telemetry monitoring - [facility] will followup again tomorrow. Please contact me if I can be of assistance in the meanwhile. Thank you for this consultation.  Facial fracture across the base of the maxillary sinuses and maxilla: - Per Trauma Surgery  Cervical spondylosis C4-C5 with central cord syndrome: - Per Neurosurgery  HTN: BP currently normal.  - Agree with holding HTN medications as primary service has done.   HPI:  Heather Hamilton is a 49 year old female with a past mental history of grade 1 diastolic dysfunction, hypertension and GERD. Presented 6/17 after unwitnessed syncopal episode and fall in the shower with resultant facial fracture, admitted by trauma service with medicine consultation for this first event of syncope. She reported frequent "dizzy episodes" with dehydration in the past as well as taking lasix. Also reported significant personal stress due to being separated from her  husband and raising a two year-old child.    This morning Heather Hamilton reports significant pain that is well controlled. Denies palpitations, chest pain, dyspnea, dizziness.   Past Medical History  Diagnosis Date  . Hypertension   . GERD (gastroesophageal reflux disease)    Past Surgical History  Procedure Laterality Date  . Cesarean section    . Cholecystectomy N/A 07/09/2014    Procedure: LAPAROSCOPIC CHOLECYSTECTOMY;  Surgeon: Liz Malady, MD;  Location: [facility] OR;  Service: General;  Laterality: N/A;   Social History:  reports that she has never smoked. She has never used smokeless tobacco. She reports that she does not drink alcohol or use illicit drugs.  Working, ambulatory, independent.   Allergies  Allergen Reactions  . Codeine Nausea Only and Other (See Comments)    Makes very heated     Father died in an accident at work when she was 57 years old.  Mother died with metastatic breast cancer.  Prior to Admission medications   Medication Sig Start Date End Date Taking? Authorizing Provider  furosemide (LASIX) 20 MG tablet Take 20 mg by mouth daily. 06/10/14  Yes Historical Provider, MD  lisinopril (PRINIVIL,ZESTRIL) 20 MG tablet Take 20 mg by mouth daily.   Yes Historical Provider, MD  methocarbamol (ROBAXIN) 500 MG tablet Take 1 tablet (500 mg total) by mouth 2 (two) times daily. Patient not taking: Reported on 05/02/2015 03/30/15   Fayrene Helper, PA-C  naproxen (NAPROSYN) 500 MG tablet Take 1 tablet (500 mg total) by mouth 2 (two) times daily as needed for moderate pain. Patient not taking: Reported on 05/02/2015 03/30/15   Fayrene Helper, PA-C  oxyCODONE (OXY IR/ROXICODONE) 5 MG immediate release tablet Take 1 tablet (  5 mg total) by mouth every 6 (six) hours as needed (pain). Patient not taking: Reported on 03/30/2015 07/10/14   Jeralyn Bennett, MD   Physical Exam: Blood pressure 104/62, pulse 73, temperature 98.5 F (36.9 C), temperature source Oral, resp. rate 16, last menstrual  period 06/16/2014, SpO2 96 %. Filed Vitals:   05/03/15 0546  BP: 104/62  Pulse: 73  Temp: 98.5 F (36.9 C)  Resp: 16     General:  49 yo female cautiously rearranging in bed.   Eyes: PERRL, EOMI  ENT: No exudates or erythema in oropharynx  Neck: no lymphadenopathy  Cardiovascular: Regular rate, no murmur or gallop. Telemetry reviewed as in A/P.   Respiratory: CTA, no accessory muscle use.  Abdomen: soft, nt, nd, +bs  Skin: lower lip laceration with sutures  Musculoskeletal: 5/5 strength in lower extremities.  upper ext ROM limited by pain  Psychiatric: Alert, oriented, appropriate.   Labs on Admission:  Basic Metabolic Panel:  Recent Labs Lab 05/02/15 0900 05/03/15 0426  NA 138 136  K 4.1 3.8  CL 104 105  CO2 20* 19*  GLUCOSE 90 90  BUN 15 10  CREATININE 0.85 0.72  CALCIUM 9.0 8.7*   CBC:  Recent Labs Lab 05/02/15 0900 05/03/15 0426  WBC 7.1 6.6  NEUTROABS 5.7  --   HGB 12.4 12.0  HCT 37.4 36.9  MCV 87.4 89.3  PLT 154 182    Radiological Exams on Admission: Ct Head Wo Contrast  05/02/2015   CLINICAL DATA:  Syncopal episode earlier today resulting in a fall hitting head and face. Lip laceration with facial trauma. Neck pain.  EXAM: CT HEAD WITHOUT CONTRAST  CT MAXILLOFACIAL WITHOUT CONTRAST  CT CERVICAL SPINE WITHOUT CONTRAST  TECHNIQUE: Multidetector CT imaging of the head, cervical spine, and maxillofacial structures were performed using the standard protocol without intravenous contrast. Multiplanar CT image reconstructions of the cervical spine and maxillofacial structures were also generated.  COMPARISON:  CT head 03/03/2012.  FINDINGS: CT HEAD FINDINGS  No evidence for acute infarction, hemorrhage, mass lesion, hydrocephalus, or extra-axial fluid. No atrophy or white matter disease. Intact calvarium. No acute sinus or mastoid disease.  CT MAXILLOFACIAL FINDINGS  There is a pure LeFort 1 type fracture across the base of the BILATERAL maxillary  sinuses. Maxillary sinus air-fluid levels representing blood are present. Nasal turbinate hypertrophy and nasal cavity hemorrhage is evident. Nasal septal fracture with RIGHT-to-LEFT deviation. The lateral and medial pterygoid plates appear intact. There is no mandibular fracture or TMJ dislocation. No frontal or sphenoid sinus air-fluid level. Minor BILATERAL ethmoid sinus fluid. No posttraumatic missing teeth. Multiple prior root canals. Periodontal disease.  No other facial fractures. Zygoma is intact. Nasal bone intact. Orbital walls intact. Air from the LEFT maxillary sinus enters the infratemporal fossa and dissects upward along the LEFT temporalis muscle but there is no orbital emphysema. Globes are intact. Facial soft tissue swelling without visible foreign body or laceration.  CT CERVICAL SPINE FINDINGS  There is no visible cervical spine fracture, traumatic subluxation, prevertebral soft tissue swelling, or intraspinal hematoma. There is mild straightening of the normal cervical lordosis. There is a moderate size calcified disc extrusion at C4-C5 which is  not posttraumatic. Smaller calcified disc protrusion C3-4 also pre-existing. Airway midline. No significant atherosclerosis. No neck masses no lung apex lesion.  IMPRESSION: No acute intracranial findings.  There is a pure LeFort 1 type fracture across the base of the maxillary sinuses and maxilla. BILATERAL maxillary sinus air-fluid levels are present  without corresponding blowout injury.  No cervical spine fracture or traumatic subluxation. Cervical spondylosis most notable at C4-C5.   Electronically Signed   By: Elsie Stain M.D.   On: 05/02/2015 10:10   Ct Cervical Spine Wo Contrast  05/02/2015   CLINICAL DATA:  Syncopal episode earlier today resulting in a fall hitting head and face. Lip laceration with facial trauma. Neck pain.  EXAM: CT HEAD WITHOUT CONTRAST  CT MAXILLOFACIAL WITHOUT CONTRAST  CT CERVICAL SPINE WITHOUT CONTRAST  TECHNIQUE:  Multidetector CT imaging of the head, cervical spine, and maxillofacial structures were performed using the standard protocol without intravenous contrast. Multiplanar CT image reconstructions of the cervical spine and maxillofacial structures were also generated.  COMPARISON:  CT head 03/03/2012.  FINDINGS: CT HEAD FINDINGS  No evidence for acute infarction, hemorrhage, mass lesion, hydrocephalus, or extra-axial fluid. No atrophy or white matter disease. Intact calvarium. No acute sinus or mastoid disease.  CT MAXILLOFACIAL FINDINGS  There is a pure LeFort 1 type fracture across the base of the BILATERAL maxillary sinuses. Maxillary sinus air-fluid levels representing blood are present. Nasal turbinate hypertrophy and nasal cavity hemorrhage is evident. Nasal septal fracture with RIGHT-to-LEFT deviation. The lateral and medial pterygoid plates appear intact. There is no mandibular fracture or TMJ dislocation. No frontal or sphenoid sinus air-fluid level. Minor BILATERAL ethmoid sinus fluid. No posttraumatic missing teeth. Multiple prior root canals. Periodontal disease.  No other facial fractures. Zygoma is intact. Nasal bone intact. Orbital walls intact. Air from the LEFT maxillary sinus enters the infratemporal fossa and dissects upward along the LEFT temporalis muscle but there is no orbital emphysema. Globes are intact. Facial soft tissue swelling without visible foreign body or laceration.  CT CERVICAL SPINE FINDINGS  There is no visible cervical spine fracture, traumatic subluxation, prevertebral soft tissue swelling, or intraspinal hematoma. There is mild straightening of the normal cervical lordosis. There is a moderate size calcified disc extrusion at C4-C5 which is  not posttraumatic. Smaller calcified disc protrusion C3-4 also pre-existing. Airway midline. No significant atherosclerosis. No neck masses no lung apex lesion.  IMPRESSION: No acute intracranial findings.  There is a pure LeFort 1 type  fracture across the base of the maxillary sinuses and maxilla. BILATERAL maxillary sinus air-fluid levels are present without corresponding blowout injury.  No cervical spine fracture or traumatic subluxation. Cervical spondylosis most notable at C4-C5.   Electronically Signed   By: Elsie Stain M.D.   On: 05/02/2015 10:10   Mr Cervical Spine Wo Contrast  05/02/2015   CLINICAL DATA:  Dizziness and syncope. Fall. Right arm pain and tingling.  EXAM: MRI CERVICAL SPINE WITHOUT CONTRAST  TECHNIQUE: Multiplanar, multisequence MR imaging of the cervical spine was performed. No intravenous contrast was administered.  COMPARISON:  None.  FINDINGS: Motion degraded study which could obscure subtle or small pathology.  No indication of occult fracture. No evidence of ligamentous insufficiency. Normal cord signal and morphology. No spinal canal hematoma.  An incidental hemangioma is noted in the upper C5 body.  No extra-spinal findings to explain. Normal flow related signal loss in the bilateral cervical and carotid arteries.  Degenerative changes:  C2-3: Unremarkable.  C3-4: Mild uncovertebral spurring on the right with early foraminal stenosis.  C4-5: Degenerative disc bulging with superimposed central protrusion which contacts the ventral cord. Uncovertebral spurs and disc causes bilateral foraminal stenosis. Grading is limited by patient motion  C5-6: Circumferential bulging of disc, effacing the ventral CSF. Bilateral uncovertebral spurs with likely mild foraminal stenosis  C6-7: Disc narrowing and circumferential bulging. Uncovertebral spur and disc in the foramina cause advanced bilateral foraminal stenosis. Near complete effacement of canal CSF without cord compression. This is consistent with mild spinal canal stenosis  C7-T1:Mild facet arthropathy on the left.  No impingement  IMPRESSION: 1. Mildly motion degraded study. 2. No occult fracture, ligament tear, or cord injury. 3. Mid and lower cervical degenerative  disc disease, as above, with advanced bilateral C4-5 and C6-7 foraminal stenosis.   Electronically Signed   By: Marnee Spring M.D.   On: 05/02/2015 13:01   Dg Chest Port 1 View  05/02/2015   CLINICAL DATA:  Dizziness and syncopal episode  EXAM: PORTABLE CHEST - 1 VIEW  COMPARISON:  07/07/2014  FINDINGS: Cardiac shadow is mildly enlarged but stable. Some fullness is noted in the right peritracheal region likely related to the portable technique and stable from the prior exam. The lungs are clear bilaterally. No bony abnormality is seen.  IMPRESSION: No acute abnormality noted.   Electronically Signed   By: Alcide Clever M.D.   On: 05/02/2015 08:56   Ct Maxillofacial Wo Cm  05/02/2015   CLINICAL DATA:  Syncopal episode earlier today resulting in a fall hitting head and face. Lip laceration with facial trauma. Neck pain.  EXAM: CT HEAD WITHOUT CONTRAST  CT MAXILLOFACIAL WITHOUT CONTRAST  CT CERVICAL SPINE WITHOUT CONTRAST  TECHNIQUE: Multidetector CT imaging of the head, cervical spine, and maxillofacial structures were performed using the standard protocol without intravenous contrast. Multiplanar CT image reconstructions of the cervical spine and maxillofacial structures were also generated.  COMPARISON:  CT head 03/03/2012.  FINDINGS: CT HEAD FINDINGS  No evidence for acute infarction, hemorrhage, mass lesion, hydrocephalus, or extra-axial fluid. No atrophy or white matter disease. Intact calvarium. No acute sinus or mastoid disease.  CT MAXILLOFACIAL FINDINGS  There is a pure LeFort 1 type fracture across the base of the BILATERAL maxillary sinuses. Maxillary sinus air-fluid levels representing blood are present. Nasal turbinate hypertrophy and nasal cavity hemorrhage is evident. Nasal septal fracture with RIGHT-to-LEFT deviation. The lateral and medial pterygoid plates appear intact. There is no mandibular fracture or TMJ dislocation. No frontal or sphenoid sinus air-fluid level. Minor BILATERAL ethmoid  sinus fluid. No posttraumatic missing teeth. Multiple prior root canals. Periodontal disease.  No other facial fractures. Zygoma is intact. Nasal bone intact. Orbital walls intact. Air from the LEFT maxillary sinus enters the infratemporal fossa and dissects upward along the LEFT temporalis muscle but there is no orbital emphysema. Globes are intact. Facial soft tissue swelling without visible foreign body or laceration.  CT CERVICAL SPINE FINDINGS  There is no visible cervical spine fracture, traumatic subluxation, prevertebral soft tissue swelling, or intraspinal hematoma. There is mild straightening of the normal cervical lordosis. There is a moderate size calcified disc extrusion at C4-C5 which is  not posttraumatic. Smaller calcified disc protrusion C3-4 also pre-existing. Airway midline. No significant atherosclerosis. No neck masses no lung apex lesion.  IMPRESSION: No acute intracranial findings.  There is a pure LeFort 1 type fracture across the base of the maxillary sinuses and maxilla. BILATERAL maxillary sinus air-fluid levels are present without corresponding blowout injury.  No cervical spine fracture or traumatic subluxation. Cervical spondylosis most notable at C4-C5.   Electronically Signed   By: Elsie Stain M.D.   On: 05/02/2015 10:10    EKG 6/17:  NSR with a question of arrhythmia in V1. EKG 6/18: NSR, normal axis, PR interval normal, QTc .  Hazeline Junker, MD FM Resident working with [facility] Pager [phone]  If 7PM-7AM, please contact night-coverage [email or web address] Password [email or web address] 05/03/2015, 10:07 AM

## 2015-05-03 NOTE — Progress Notes (Signed)
On adm to PACU, no oral bleeding noted , wires intact

## 2015-05-03 NOTE — Progress Notes (Signed)
Transferred back to 6N32, telemetry applied. No oral bleeding noted, wires intact. Wire cutters at bedside . Pt and family  Made aware of reason  and importance of  having wire cutters stay with pt at all times.

## 2015-05-03 NOTE — Progress Notes (Signed)
Subjective: No new symptoms. Still burning and tingling pain in arms with motion. Has been ambulating short distances with physical therapy. Anticipating surgery for her facial fractures today. Husband at bedside.  Objective: Vital signs in last 24 hours: Temp:  [97.5 F (36.4 C)-98.6 F (37 C)] 98.5 F (36.9 C) (06/18 0546) Pulse Rate:  [72-88] 73 (06/18 0546) Resp:  [16-22] 16 (06/18 0546) BP: (100-140)/(55-75) 104/62 mmHg (06/18 0546) SpO2:  [93 %-99 %] 96 % (06/18 0546)    Intake/Output from previous day:   Intake/Output this shift:    General appearance: somewhat drowsy but communicative and in no distress Head: Some swelling central face. Lacerations lower lip closed and clean. Resp: clear to auscultation bilaterally Neurologic: Alert and fully oriented. Moderate diffuse weakness of grip and arm motion bilaterally.  Lab Results:   Recent Labs  05/02/15 0900 05/03/15 0426  WBC 7.1 6.6  HGB 12.4 12.0  HCT 37.4 36.9  PLT 154 182   BMET  Recent Labs  05/02/15 0900 05/03/15 0426  NA 138 136  K 4.1 3.8  CL 104 105  CO2 20* 19*  GLUCOSE 90 90  BUN 15 10  CREATININE 0.85 0.72  CALCIUM 9.0 8.7*   PT/INR No results for input(s): LABPROT, INR in the last 72 hours. ABG No results for input(s): PHART, HCO3 in the last 72 hours.  Invalid input(s): PCO2, PO2  Studies/Results: Ct Head Wo Contrast  05/02/2015   CLINICAL DATA:  Syncopal episode earlier today resulting in a fall hitting head and face. Lip laceration with facial trauma. Neck pain.  EXAM: CT HEAD WITHOUT CONTRAST  CT MAXILLOFACIAL WITHOUT CONTRAST  CT CERVICAL SPINE WITHOUT CONTRAST  TECHNIQUE: Multidetector CT imaging of the head, cervical spine, and maxillofacial structures were performed using the standard protocol without intravenous contrast. Multiplanar CT image reconstructions of the cervical spine and maxillofacial structures were also generated.  COMPARISON:  CT head 03/03/2012.  FINDINGS: CT  HEAD FINDINGS  No evidence for acute infarction, hemorrhage, mass lesion, hydrocephalus, or extra-axial fluid. No atrophy or white matter disease. Intact calvarium. No acute sinus or mastoid disease.  CT MAXILLOFACIAL FINDINGS  There is a pure LeFort 1 type fracture across the base of the BILATERAL maxillary sinuses. Maxillary sinus air-fluid levels representing blood are present. Nasal turbinate hypertrophy and nasal cavity hemorrhage is evident. Nasal septal fracture with RIGHT-to-LEFT deviation. The lateral and medial pterygoid plates appear intact. There is no mandibular fracture or TMJ dislocation. No frontal or sphenoid sinus air-fluid level. Minor BILATERAL ethmoid sinus fluid. No posttraumatic missing teeth. Multiple prior root canals. Periodontal disease.  No other facial fractures. Zygoma is intact. Nasal bone intact. Orbital walls intact. Air from the LEFT maxillary sinus enters the infratemporal fossa and dissects upward along the LEFT temporalis muscle but there is no orbital emphysema. Globes are intact. Facial soft tissue swelling without visible foreign body or laceration.  CT CERVICAL SPINE FINDINGS  There is no visible cervical spine fracture, traumatic subluxation, prevertebral soft tissue swelling, or intraspinal hematoma. There is mild straightening of the normal cervical lordosis. There is a moderate size calcified disc extrusion at C4-C5 which is  not posttraumatic. Smaller calcified disc protrusion C3-4 also pre-existing. Airway midline. No significant atherosclerosis. No neck masses no lung apex lesion.  IMPRESSION: No acute intracranial findings.  There is a pure LeFort 1 type fracture across the base of the maxillary sinuses and maxilla. BILATERAL maxillary sinus air-fluid levels are present without corresponding blowout injury.  No cervical spine  fracture or traumatic subluxation. Cervical spondylosis most notable at C4-C5.   Electronically Signed   By: Elsie Stain M.D.   On: 05/02/2015  10:10   Ct Cervical Spine Wo Contrast  05/02/2015   CLINICAL DATA:  Syncopal episode earlier today resulting in a fall hitting head and face. Lip laceration with facial trauma. Neck pain.  EXAM: CT HEAD WITHOUT CONTRAST  CT MAXILLOFACIAL WITHOUT CONTRAST  CT CERVICAL SPINE WITHOUT CONTRAST  TECHNIQUE: Multidetector CT imaging of the head, cervical spine, and maxillofacial structures were performed using the standard protocol without intravenous contrast. Multiplanar CT image reconstructions of the cervical spine and maxillofacial structures were also generated.  COMPARISON:  CT head 03/03/2012.  FINDINGS: CT HEAD FINDINGS  No evidence for acute infarction, hemorrhage, mass lesion, hydrocephalus, or extra-axial fluid. No atrophy or white matter disease. Intact calvarium. No acute sinus or mastoid disease.  CT MAXILLOFACIAL FINDINGS  There is a pure LeFort 1 type fracture across the base of the BILATERAL maxillary sinuses. Maxillary sinus air-fluid levels representing blood are present. Nasal turbinate hypertrophy and nasal cavity hemorrhage is evident. Nasal septal fracture with RIGHT-to-LEFT deviation. The lateral and medial pterygoid plates appear intact. There is no mandibular fracture or TMJ dislocation. No frontal or sphenoid sinus air-fluid level. Minor BILATERAL ethmoid sinus fluid. No posttraumatic missing teeth. Multiple prior root canals. Periodontal disease.  No other facial fractures. Zygoma is intact. Nasal bone intact. Orbital walls intact. Air from the LEFT maxillary sinus enters the infratemporal fossa and dissects upward along the LEFT temporalis muscle but there is no orbital emphysema. Globes are intact. Facial soft tissue swelling without visible foreign body or laceration.  CT CERVICAL SPINE FINDINGS  There is no visible cervical spine fracture, traumatic subluxation, prevertebral soft tissue swelling, or intraspinal hematoma. There is mild straightening of the normal cervical lordosis. There  is a moderate size calcified disc extrusion at C4-C5 which is  not posttraumatic. Smaller calcified disc protrusion C3-4 also pre-existing. Airway midline. No significant atherosclerosis. No neck masses no lung apex lesion.  IMPRESSION: No acute intracranial findings.  There is a pure LeFort 1 type fracture across the base of the maxillary sinuses and maxilla. BILATERAL maxillary sinus air-fluid levels are present without corresponding blowout injury.  No cervical spine fracture or traumatic subluxation. Cervical spondylosis most notable at C4-C5.   Electronically Signed   By: Elsie Stain M.D.   On: 05/02/2015 10:10   Mr Cervical Spine Wo Contrast  05/02/2015   CLINICAL DATA:  Dizziness and syncope. Fall. Right arm pain and tingling.  EXAM: MRI CERVICAL SPINE WITHOUT CONTRAST  TECHNIQUE: Multiplanar, multisequence MR imaging of the cervical spine was performed. No intravenous contrast was administered.  COMPARISON:  None.  FINDINGS: Motion degraded study which could obscure subtle or small pathology.  No indication of occult fracture. No evidence of ligamentous insufficiency. Normal cord signal and morphology. No spinal canal hematoma.  An incidental hemangioma is noted in the upper C5 body.  No extra-spinal findings to explain. Normal flow related signal loss in the bilateral cervical and carotid arteries.  Degenerative changes:  C2-3: Unremarkable.  C3-4: Mild uncovertebral spurring on the right with early foraminal stenosis.  C4-5: Degenerative disc bulging with superimposed central protrusion which contacts the ventral cord. Uncovertebral spurs and disc causes bilateral foraminal stenosis. Grading is limited by patient motion  C5-6: Circumferential bulging of disc, effacing the ventral CSF. Bilateral uncovertebral spurs with likely mild foraminal stenosis  C6-7: Disc narrowing and circumferential bulging. Uncovertebral spur  and disc in the foramina cause advanced bilateral foraminal stenosis. Near complete  effacement of canal CSF without cord compression. This is consistent with mild spinal canal stenosis  C7-T1:Mild facet arthropathy on the left.  No impingement  IMPRESSION: 1. Mildly motion degraded study. 2. No occult fracture, ligament tear, or cord injury. 3. Mid and lower cervical degenerative disc disease, as above, with advanced bilateral C4-5 and C6-7 foraminal stenosis.   Electronically Signed   By: Marnee Spring M.D.   On: 05/02/2015 13:01   Dg Chest Port 1 View  05/02/2015   CLINICAL DATA:  Dizziness and syncopal episode  EXAM: PORTABLE CHEST - 1 VIEW  COMPARISON:  07/07/2014  FINDINGS: Cardiac shadow is mildly enlarged but stable. Some fullness is noted in the right peritracheal region likely related to the portable technique and stable from the prior exam. The lungs are clear bilaterally. No bony abnormality is seen.  IMPRESSION: No acute abnormality noted.   Electronically Signed   By: Alcide Clever M.D.   On: 05/02/2015 08:56   Ct Maxillofacial Wo Cm  05/02/2015   CLINICAL DATA:  Syncopal episode earlier today resulting in a fall hitting head and face. Lip laceration with facial trauma. Neck pain.  EXAM: CT HEAD WITHOUT CONTRAST  CT MAXILLOFACIAL WITHOUT CONTRAST  CT CERVICAL SPINE WITHOUT CONTRAST  TECHNIQUE: Multidetector CT imaging of the head, cervical spine, and maxillofacial structures were performed using the standard protocol without intravenous contrast. Multiplanar CT image reconstructions of the cervical spine and maxillofacial structures were also generated.  COMPARISON:  CT head 03/03/2012.  FINDINGS: CT HEAD FINDINGS  No evidence for acute infarction, hemorrhage, mass lesion, hydrocephalus, or extra-axial fluid. No atrophy or white matter disease. Intact calvarium. No acute sinus or mastoid disease.  CT MAXILLOFACIAL FINDINGS  There is a pure LeFort 1 type fracture across the base of the BILATERAL maxillary sinuses. Maxillary sinus air-fluid levels representing blood are present.  Nasal turbinate hypertrophy and nasal cavity hemorrhage is evident. Nasal septal fracture with RIGHT-to-LEFT deviation. The lateral and medial pterygoid plates appear intact. There is no mandibular fracture or TMJ dislocation. No frontal or sphenoid sinus air-fluid level. Minor BILATERAL ethmoid sinus fluid. No posttraumatic missing teeth. Multiple prior root canals. Periodontal disease.  No other facial fractures. Zygoma is intact. Nasal bone intact. Orbital walls intact. Air from the LEFT maxillary sinus enters the infratemporal fossa and dissects upward along the LEFT temporalis muscle but there is no orbital emphysema. Globes are intact. Facial soft tissue swelling without visible foreign body or laceration.  CT CERVICAL SPINE FINDINGS  There is no visible cervical spine fracture, traumatic subluxation, prevertebral soft tissue swelling, or intraspinal hematoma. There is mild straightening of the normal cervical lordosis. There is a moderate size calcified disc extrusion at C4-C5 which is  not posttraumatic. Smaller calcified disc protrusion C3-4 also pre-existing. Airway midline. No significant atherosclerosis. No neck masses no lung apex lesion.  IMPRESSION: No acute intracranial findings.  There is a pure LeFort 1 type fracture across the base of the maxillary sinuses and maxilla. BILATERAL maxillary sinus air-fluid levels are present without corresponding blowout injury.  No cervical spine fracture or traumatic subluxation. Cervical spondylosis most notable at C4-C5.   Electronically Signed   By: Elsie Stain M.D.   On: 05/02/2015 10:10    Anti-infectives: Anti-infectives    None      Assessment/Plan: GLF C4-5 and C6-7 foraminal stenosis/central cord syndrome- Probable congenital spinal stenosis. May need cervical decompression in the future  but no plans for this hospitalization LeFort type I fracture-Dr. Jearld Fenton Planning to proceed with ORIF today Lip laceration-sutures by EDP. Will need to  have sutures removed 4-5 days Syncope-cardiac monitoring. Hold BP meds and lasix. Medicine to evaluate.  VTE prophylaxis-SCD/lovenox FEN-Nothing by mouth for surgery today then advanced to full liquids Dispo-PT evaluating, likely will need rehabilitation   All this discussed today with patient and her husband.    LOS: 1 day    Heather Hamilton T 05/03/2015

## 2015-05-03 NOTE — Progress Notes (Signed)
Wire cutters at bedside on admission

## 2015-05-03 NOTE — Progress Notes (Signed)
Report given to Short stay nurse, pt to OR via bed.  SCD's placed.  Husband down to surgical waiting area.

## 2015-05-03 NOTE — Progress Notes (Signed)
Patient ID: Heather Hamilton, female   DOB: July 31, 1966, 49 y.o.   MRN: [id number] Subjective:  The patient is alert and pleasant. She looks better than yesterday. She continues to complain of burning pain in her hands.  Objective: Vital signs in last 24 hours: Temp:  [97.5 F (36.4 C)-98.5 F (36.9 C)] 98.5 F (36.9 C) (06/18 0546) Pulse Rate:  [72-88] 73 (06/18 0546) Resp:  [16-21] 16 (06/18 0546) BP: (100-115)/(55-75) 104/62 mmHg (06/18 0546) SpO2:  [93 %-99 %] 96 % (06/18 0546)  Intake/Output from previous day:   Intake/Output this shift:    Physical exam the patient is alert and oriented 3. Her strength is grossly normal except her bilateral hands are weak at 4 over 5.  Lab Results:  Recent Labs  05/02/15 0900 05/03/15 0426  WBC 7.1 6.6  HGB 12.4 12.0  HCT 37.4 36.9  PLT 154 182   BMET  Recent Labs  05/02/15 0900 05/03/15 0426  NA 138 136  K 4.1 3.8  CL 104 105  CO2 20* 19*  GLUCOSE 90 90  BUN 15 10  CREATININE 0.85 0.72  CALCIUM 9.0 8.7*    Studies/Results: Ct Head Wo Contrast  05/02/2015   CLINICAL DATA:  Syncopal episode earlier today resulting in a fall hitting head and face. Lip laceration with facial trauma. Neck pain.  EXAM: CT HEAD WITHOUT CONTRAST  CT MAXILLOFACIAL WITHOUT CONTRAST  CT CERVICAL SPINE WITHOUT CONTRAST  TECHNIQUE: Multidetector CT imaging of the head, cervical spine, and maxillofacial structures were performed using the standard protocol without intravenous contrast. Multiplanar CT image reconstructions of the cervical spine and maxillofacial structures were also generated.  COMPARISON:  CT head 03/03/2012.  FINDINGS: CT HEAD FINDINGS  No evidence for acute infarction, hemorrhage, mass lesion, hydrocephalus, or extra-axial fluid. No atrophy or white matter disease. Intact calvarium. No acute sinus or mastoid disease.  CT MAXILLOFACIAL FINDINGS  There is a pure LeFort 1 type fracture across the base of the BILATERAL maxillary sinuses. Maxillary  sinus air-fluid levels representing blood are present. Nasal turbinate hypertrophy and nasal cavity hemorrhage is evident. Nasal septal fracture with RIGHT-to-LEFT deviation. The lateral and medial pterygoid plates appear intact. There is no mandibular fracture or TMJ dislocation. No frontal or sphenoid sinus air-fluid level. Minor BILATERAL ethmoid sinus fluid. No posttraumatic missing teeth. Multiple prior root canals. Periodontal disease.  No other facial fractures. Zygoma is intact. Nasal bone intact. Orbital walls intact. Air from the LEFT maxillary sinus enters the infratemporal fossa and dissects upward along the LEFT temporalis muscle but there is no orbital emphysema. Globes are intact. Facial soft tissue swelling without visible foreign body or laceration.  CT CERVICAL SPINE FINDINGS  There is no visible cervical spine fracture, traumatic subluxation, prevertebral soft tissue swelling, or intraspinal hematoma. There is mild straightening of the normal cervical lordosis. There is a moderate size calcified disc extrusion at C4-C5 which is  not posttraumatic. Smaller calcified disc protrusion C3-4 also pre-existing. Airway midline. No significant atherosclerosis. No neck masses no lung apex lesion.  IMPRESSION: No acute intracranial findings.  There is a pure LeFort 1 type fracture across the base of the maxillary sinuses and maxilla. BILATERAL maxillary sinus air-fluid levels are present without corresponding blowout injury.  No cervical spine fracture or traumatic subluxation. Cervical spondylosis most notable at C4-C5.   Electronically Signed   By: Elsie Stain M.D.   On: 05/02/2015 10:10   Ct Cervical Spine Wo Contrast  05/02/2015   CLINICAL DATA:  Syncopal episode earlier today resulting in a fall hitting head and face. Lip laceration with facial trauma. Neck pain.  EXAM: CT HEAD WITHOUT CONTRAST  CT MAXILLOFACIAL WITHOUT CONTRAST  CT CERVICAL SPINE WITHOUT CONTRAST  TECHNIQUE: Multidetector CT  imaging of the head, cervical spine, and maxillofacial structures were performed using the standard protocol without intravenous contrast. Multiplanar CT image reconstructions of the cervical spine and maxillofacial structures were also generated.  COMPARISON:  CT head 03/03/2012.  FINDINGS: CT HEAD FINDINGS  No evidence for acute infarction, hemorrhage, mass lesion, hydrocephalus, or extra-axial fluid. No atrophy or white matter disease. Intact calvarium. No acute sinus or mastoid disease.  CT MAXILLOFACIAL FINDINGS  There is a pure LeFort 1 type fracture across the base of the BILATERAL maxillary sinuses. Maxillary sinus air-fluid levels representing blood are present. Nasal turbinate hypertrophy and nasal cavity hemorrhage is evident. Nasal septal fracture with RIGHT-to-LEFT deviation. The lateral and medial pterygoid plates appear intact. There is no mandibular fracture or TMJ dislocation. No frontal or sphenoid sinus air-fluid level. Minor BILATERAL ethmoid sinus fluid. No posttraumatic missing teeth. Multiple prior root canals. Periodontal disease.  No other facial fractures. Zygoma is intact. Nasal bone intact. Orbital walls intact. Air from the LEFT maxillary sinus enters the infratemporal fossa and dissects upward along the LEFT temporalis muscle but there is no orbital emphysema. Globes are intact. Facial soft tissue swelling without visible foreign body or laceration.  CT CERVICAL SPINE FINDINGS  There is no visible cervical spine fracture, traumatic subluxation, prevertebral soft tissue swelling, or intraspinal hematoma. There is mild straightening of the normal cervical lordosis. There is a moderate size calcified disc extrusion at C4-C5 which is  not posttraumatic. Smaller calcified disc protrusion C3-4 also pre-existing. Airway midline. No significant atherosclerosis. No neck masses no lung apex lesion.  IMPRESSION: No acute intracranial findings.  There is a pure LeFort 1 type fracture across the  base of the maxillary sinuses and maxilla. BILATERAL maxillary sinus air-fluid levels are present without corresponding blowout injury.  No cervical spine fracture or traumatic subluxation. Cervical spondylosis most notable at C4-C5.   Electronically Signed   By: Elsie Stain M.D.   On: 05/02/2015 10:10   Mr Cervical Spine Wo Contrast  05/02/2015   CLINICAL DATA:  Dizziness and syncope. Fall. Right arm pain and tingling.  EXAM: MRI CERVICAL SPINE WITHOUT CONTRAST  TECHNIQUE: Multiplanar, multisequence MR imaging of the cervical spine was performed. No intravenous contrast was administered.  COMPARISON:  None.  FINDINGS: Motion degraded study which could obscure subtle or small pathology.  No indication of occult fracture. No evidence of ligamentous insufficiency. Normal cord signal and morphology. No spinal canal hematoma.  An incidental hemangioma is noted in the upper C5 body.  No extra-spinal findings to explain. Normal flow related signal loss in the bilateral cervical and carotid arteries.  Degenerative changes:  C2-3: Unremarkable.  C3-4: Mild uncovertebral spurring on the right with early foraminal stenosis.  C4-5: Degenerative disc bulging with superimposed central protrusion which contacts the ventral cord. Uncovertebral spurs and disc causes bilateral foraminal stenosis. Grading is limited by patient motion  C5-6: Circumferential bulging of disc, effacing the ventral CSF. Bilateral uncovertebral spurs with likely mild foraminal stenosis  C6-7: Disc narrowing and circumferential bulging. Uncovertebral spur and disc in the foramina cause advanced bilateral foraminal stenosis. Near complete effacement of canal CSF without cord compression. This is consistent with mild spinal canal stenosis  C7-T1:Mild facet arthropathy on the left.  No impingement  IMPRESSION: 1.  Mildly motion degraded study. 2. No occult fracture, ligament tear, or cord injury. 3. Mid and lower cervical degenerative disc disease, as  above, with advanced bilateral C4-5 and C6-7 foraminal stenosis.   Electronically Signed   By: Marnee Spring M.D.   On: 05/02/2015 13:01   Dg Chest Port 1 View  05/02/2015   CLINICAL DATA:  Dizziness and syncopal episode  EXAM: PORTABLE CHEST - 1 VIEW  COMPARISON:  07/07/2014  FINDINGS: Cardiac shadow is mildly enlarged but stable. Some fullness is noted in the right peritracheal region likely related to the portable technique and stable from the prior exam. The lungs are clear bilaterally. No bony abnormality is seen.  IMPRESSION: No acute abnormality noted.   Electronically Signed   By: Alcide Clever M.D.   On: 05/02/2015 08:56   Ct Maxillofacial Wo Cm  05/02/2015   CLINICAL DATA:  Syncopal episode earlier today resulting in a fall hitting head and face. Lip laceration with facial trauma. Neck pain.  EXAM: CT HEAD WITHOUT CONTRAST  CT MAXILLOFACIAL WITHOUT CONTRAST  CT CERVICAL SPINE WITHOUT CONTRAST  TECHNIQUE: Multidetector CT imaging of the head, cervical spine, and maxillofacial structures were performed using the standard protocol without intravenous contrast. Multiplanar CT image reconstructions of the cervical spine and maxillofacial structures were also generated.  COMPARISON:  CT head 03/03/2012.  FINDINGS: CT HEAD FINDINGS  No evidence for acute infarction, hemorrhage, mass lesion, hydrocephalus, or extra-axial fluid. No atrophy or white matter disease. Intact calvarium. No acute sinus or mastoid disease.  CT MAXILLOFACIAL FINDINGS  There is a pure LeFort 1 type fracture across the base of the BILATERAL maxillary sinuses. Maxillary sinus air-fluid levels representing blood are present. Nasal turbinate hypertrophy and nasal cavity hemorrhage is evident. Nasal septal fracture with RIGHT-to-LEFT deviation. The lateral and medial pterygoid plates appear intact. There is no mandibular fracture or TMJ dislocation. No frontal or sphenoid sinus air-fluid level. Minor BILATERAL ethmoid sinus fluid. No  posttraumatic missing teeth. Multiple prior root canals. Periodontal disease.  No other facial fractures. Zygoma is intact. Nasal bone intact. Orbital walls intact. Air from the LEFT maxillary sinus enters the infratemporal fossa and dissects upward along the LEFT temporalis muscle but there is no orbital emphysema. Globes are intact. Facial soft tissue swelling without visible foreign body or laceration.  CT CERVICAL SPINE FINDINGS  There is no visible cervical spine fracture, traumatic subluxation, prevertebral soft tissue swelling, or intraspinal hematoma. There is mild straightening of the normal cervical lordosis. There is a moderate size calcified disc extrusion at C4-C5 which is  not posttraumatic. Smaller calcified disc protrusion C3-4 also pre-existing. Airway midline. No significant atherosclerosis. No neck masses no lung apex lesion.  IMPRESSION: No acute intracranial findings.  There is a pure LeFort 1 type fracture across the base of the maxillary sinuses and maxilla. BILATERAL maxillary sinus air-fluid levels are present without corresponding blowout injury.  No cervical spine fracture or traumatic subluxation. Cervical spondylosis most notable at C4-C5.   Electronically Signed   By: Elsie Stain M.D.   On: 05/02/2015 10:10    Assessment/Plan: Cervical spondylosis, stenosis, central cord syndrome: I have again discussed the situation with the patient and her husband. I recommended that we give her time to recover from her presumed cervical central cord syndrome. There is no indication for urgent cervical surgery in the absence of instability or significant acute neural compression. Please have her follow-up with me in the office.  LeFort fracture: Surgery is planned today. Hyperextension should  be avoided.  LOS: 1 day     Aribelle Mccosh D 05/03/2015, 9:27 AM

## 2015-05-03 NOTE — Progress Notes (Signed)
OT Cancellation Note  Patient Details Name: Heather Hamilton MRN: [id number] DOB: Apr 18, 1966   Cancelled Treatment:    Reason Eval/Treat Not Completed: Other (comment). Pt planning to have surgery today.   Earlie Raveling OTR/L [phone] 05/03/2015, 8:27 AM

## 2015-05-03 NOTE — Anesthesia Procedure Notes (Signed)
Procedure Name: Intubation Date/Time: 05/03/2015 1:48 PM Performed by: Gwenyth Allegra Pre-anesthesia Checklist: Emergency Drugs available, Patient identified, Timeout performed, Suction available and Patient being monitored Patient Re-evaluated:Patient Re-evaluated prior to inductionOxygen Delivery Method: Circle system utilized Preoxygenation: Pre-oxygenation with 100% oxygen Intubation Type: IV induction Ventilation: Mask ventilation without difficulty Laryngoscope Size: Glidescope Nasal Tubes: Nasal Rae, Left, Magill forceps- large, utilized and Nasal prep performed Tube size: 7.0 mm Number of attempts: 1 Airway Equipment and Method: Video-laryngoscopy Placement Confirmation: ETT inserted through vocal cords under direct vision,  breath sounds checked- equal and bilateral and positive ETCO2 Tube secured with: Tape Dental Injury: Teeth and Oropharynx as per pre-operative assessment

## 2015-05-03 NOTE — Progress Notes (Signed)
PT Cancellation Note  Patient Details Name: RAMATA GENAO MRN: [id number] DOB: Mar 01, 1966   Cancelled Treatment:    Reason Eval/Treat Not Completed: Other (comment) Pt going to OR today. Will follow up next available time to perform re-evaluation.   Blake Divine A Alannis Hsia 05/03/2015, 8:03 AM Mylo Red, PT, DPT [phone]

## 2015-05-04 ENCOUNTER — Inpatient Hospital Stay (HOSPITAL_COMMUNITY): Payer: 59

## 2015-05-04 DIAGNOSIS — R55 Syncope and collapse: Secondary | ICD-10-CM

## 2015-05-04 DIAGNOSIS — M4802 Spinal stenosis, cervical region: Secondary | ICD-10-CM

## 2015-05-04 NOTE — Progress Notes (Signed)
PROGRESS NOTE  DELESHA Hamilton XLK:[id number] DOB: [birth year] DOA: 05/02/2015 PCP: REDMON,NOELLE, PA-C  HPI/Recap of past 58 hours: 49 year old female past history of grade 1 diastolic heart failure, hypertension who sustained an unwitnessed syncopal episode while in the shower which led to result facial fracture. Admitted to the trauma service. She underwent maxillofacial repair on 6/18. Hospitalists were consulted for syncope. Echocardiogram checked and found to be unrevealing. Patient has had no events of fall on telemetry   Assessment/Plan: Principal Problem:   Cervical spinal stenosis Active Problems:   Fall   LeFort I fracture   Syncope: given normal echo, young age and no episodes of arrhythmia, I have a very strong suspicion that cause of her syncope was a combination of orthostasis and vasovagal caused by being in hot shower, very poor by mouth intake as of late (patient has been under much stress at home) and anti-hypertensive medications including Lasix. For now would favor continued gentle hydration and keeping her on telemetry.   Central cord syndrome   Hypertension   Faintness   Code Status: * Full code  Family Communication: husband at the bedside  Disposition Plan: as per trauma, given episodes of central canal stenosis, awaiting inpatient rehabilitation evaluation?   Consultants:  Hospitalists  Neurosurgery  Procedures:  Status post maxillofacial repair done 6/18  Echocardiogram done 6/19: Normal ejection fraction, no significant valvular dysfunction, no reported diastolic dysfunction  Antibiotics:  None   Objective: BP 109/66 mmHg  Pulse 77  Temp(Src) 98.8 F (37.1 C) (Axillary)  Resp 18  SpO2 93%  LMP 06/16/2014  Intake/Output Summary (Last 24 hours) at 05/04/15 1853 Last data filed at 05/04/15 1300  Gross per 24 hour  Intake   1490 ml  Output   1550 ml  Net    -60 ml   There were no vitals filed for this visit.  Exam:   General:   alert and oriented, fatigued, face wrapped  Cardiovascular: regular rate and rhythm, S1-S2, soft 206 systolic ejection murmur  Respiratory: clear to auscultation bilaterally  Abdomen: soft, nontender, nondistended, positive bowel sounds  Musculoskeletal: no clubbing or cyanosis or edema   Data Reviewed: Basic Metabolic Panel:  Recent Labs Lab 05/02/15 0900 05/03/15 0426  NA 138 136  K 4.1 3.8  CL 104 105  CO2 20* 19*  GLUCOSE 90 90  BUN 15 10  CREATININE 0.85 0.72  CALCIUM 9.0 8.7*   Liver Function Tests: No results for input(s): AST, ALT, ALKPHOS, BILITOT, PROT, ALBUMIN in the last 168 hours. No results for input(s): LIPASE, AMYLASE in the last 168 hours. No results for input(s): AMMONIA in the last 168 hours. CBC:  Recent Labs Lab 05/02/15 0900 05/03/15 0426  WBC 7.1 6.6  NEUTROABS 5.7  --   HGB 12.4 12.0  HCT 37.4 36.9  MCV 87.4 89.3  PLT 154 182   Cardiac Enzymes:   No results for input(s): CKTOTAL, CKMB, CKMBINDEX, TROPONINI in the last 168 hours. BNP (last 3 results) No results for input(s): BNP in the last 8760 hours.  ProBNP (last 3 results) No results for input(s): PROBNP in the last 8760 hours.  CBG: No results for input(s): GLUCAP in the last 168 hours.  Recent Results (from the past 240 hour(s))  Surgical pcr screen     Status: None   Collection Time: 05/03/15 12:24 PM  Result Value Ref Range Status   MRSA, PCR NEGATIVE NEGATIVE Final   Staphylococcus aureus NEGATIVE NEGATIVE Final  Comment:        The Xpert SA Assay (FDA approved for NASAL specimens in patients over 90 years of age), is one component of a comprehensive surveillance program.  Test performance has been validated by [facility] for patients greater than or equal to 10 year old. It is not intended to diagnose infection nor to guide or monitor treatment.      Studies: No results found.  Scheduled Meds: . enoxaparin (LOVENOX) injection  40 mg Subcutaneous Q24H    . pregabalin  75 mg Oral BID    Continuous Infusions: . dextrose 5 % and 0.9% NaCl 125 mL/hr at 05/04/15 1610     Time spent: 15 minutes  Hollice Espy  [facility] Pager [phone]. If 7PM-7AM, please contact night-coverage at [email or web address], password [email or web address] 05/04/2015, 6:53 PM  LOS: 2 days

## 2015-05-04 NOTE — Progress Notes (Signed)
1 Day Post-Op  Subjective: She is very sleepy secondary to pain meds just given. She cannot assess her occlusion. Lip swelling as expected  Objective: Vital signs in last 24 hours: Temp:  [98.4 F (36.9 C)-102.2 F (39 C)] 98.4 F (36.9 C) (06/19 0602) Pulse Rate:  [77-110] 77 (06/19 0602) Resp:  [15-19] 18 (06/19 0602) BP: (102-147)/(59-80) 102/63 mmHg (06/19 0602) SpO2:  [90 %-97 %] 92 % (06/19 0602)    Intake/Output from previous day: 06/18 0701 - 06/19 0700 In: 2150 [I.V.:2150] Out: 1350 [Urine:1350] Intake/Output this shift: Total I/O In: 120 [P.O.:120] Out: -   she has lip swelling which is expected. wires are tight  Lab Results:   Recent Labs  05/02/15 0900 05/03/15 0426  WBC 7.1 6.6  HGB 12.4 12.0  HCT 37.4 36.9  PLT 154 182   BMET  Recent Labs  05/02/15 0900 05/03/15 0426  NA 138 136  K 4.1 3.8  CL 104 105  CO2 20* 19*  GLUCOSE 90 90  BUN 15 10  CREATININE 0.85 0.72  CALCIUM 9.0 8.7*   PT/INR No results for input(s): LABPROT, INR in the last 72 hours. ABG No results for input(s): PHART, HCO3 in the last 72 hours.  Invalid input(s): PCO2, PO2  Studies/Results: Mr Cervical Spine Wo Contrast  05/02/2015   CLINICAL DATA:  Dizziness and syncope. Fall. Right arm pain and tingling.  EXAM: MRI CERVICAL SPINE WITHOUT CONTRAST  TECHNIQUE: Multiplanar, multisequence MR imaging of the cervical spine was performed. No intravenous contrast was administered.  COMPARISON:  None.  FINDINGS: Motion degraded study which could obscure subtle or small pathology.  No indication of occult fracture. No evidence of ligamentous insufficiency. Normal cord signal and morphology. No spinal canal hematoma.  An incidental hemangioma is noted in the upper C5 body.  No extra-spinal findings to explain. Normal flow related signal loss in the bilateral cervical and carotid arteries.  Degenerative changes:  C2-3: Unremarkable.  C3-4: Mild uncovertebral spurring on the right with  early foraminal stenosis.  C4-5: Degenerative disc bulging with superimposed central protrusion which contacts the ventral cord. Uncovertebral spurs and disc causes bilateral foraminal stenosis. Grading is limited by patient motion  C5-6: Circumferential bulging of disc, effacing the ventral CSF. Bilateral uncovertebral spurs with likely mild foraminal stenosis  C6-7: Disc narrowing and circumferential bulging. Uncovertebral spur and disc in the foramina cause advanced bilateral foraminal stenosis. Near complete effacement of canal CSF without cord compression. This is consistent with mild spinal canal stenosis  C7-T1:Mild facet arthropathy on the left.  No impingement  IMPRESSION: 1. Mildly motion degraded study. 2. No occult fracture, ligament tear, or cord injury. 3. Mid and lower cervical degenerative disc disease, as above, with advanced bilateral C4-5 and C6-7 foraminal stenosis.   Electronically Signed   By: Marnee Spring M.D.   On: 05/02/2015 13:01    Anti-infectives: Anti-infectives    None      Assessment/Plan: s/p Procedure(s): OPEN REDUCTION INTERNAL FIXATION (ORIF) MAXILLA FRACTURE  (Bilateral) I need to assess if her occlusion is right and that will be difficult for her with no molars. I will probably need to cut wires which is plan anyway as long as she will stay on a liquid diet for 3-4 weeks. She can follow up in the office next week to perform wire cutting. be sure to instruct if discharged about keeping wire cutters with her at all times. F/U this week in my office.  LOS: 2 days  Suzanna Obey 05/04/2015

## 2015-05-04 NOTE — Progress Notes (Signed)
  Echocardiogram 2D Echocardiogram has been performed.  Arvil Chaco 05/04/2015, 8:47 AM

## 2015-05-04 NOTE — Progress Notes (Signed)
Patient ID: Heather Hamilton, female   DOB: 05/29/1966, 49 y.o.   MRN: [id number] 1 Day Post-Op  Subjective: Doing pretty well this morning. Status post ORIF of facial fractures. Tolerating full liquids without nausea. Would like to get up and take a shower. No change in arm pain/weakness  Objective: Vital signs in last 24 hours: Temp:  [98.4 F (36.9 C)-102.2 F (39 C)] 98.4 F (36.9 C) (06/19 0602) Pulse Rate:  [77-110] 77 (06/19 0602) Resp:  [15-19] 18 (06/19 0602) BP: (102-147)/(59-80) 102/63 mmHg (06/19 0602) SpO2:  [90 %-97 %] 92 % (06/19 0602)    Intake/Output from previous day: 06/18 0701 - 06/19 0700 In: 2150 [I.V.:2150] Out: 1350 [Urine:1350] Intake/Output this shift:    General appearance: alert, cooperative and no distress Resp: clear to auscultation bilaterally Neurologic: Motor: moderate generalized weakness upper extremities but reasonably good grip Incision/Wound: facial lacerations clean  Lab Results:   Recent Labs  05/02/15 0900 05/03/15 0426  WBC 7.1 6.6  HGB 12.4 12.0  HCT 37.4 36.9  PLT 154 182   BMET  Recent Labs  05/02/15 0900 05/03/15 0426  NA 138 136  K 4.1 3.8  CL 104 105  CO2 20* 19*  GLUCOSE 90 90  BUN 15 10  CREATININE 0.85 0.72  CALCIUM 9.0 8.7*     Studies/Results: Ct Head Wo Contrast  05/02/2015   CLINICAL DATA:  Syncopal episode earlier today resulting in a fall hitting head and face. Lip laceration with facial trauma. Neck pain.  EXAM: CT HEAD WITHOUT CONTRAST  CT MAXILLOFACIAL WITHOUT CONTRAST  CT CERVICAL SPINE WITHOUT CONTRAST  TECHNIQUE: Multidetector CT imaging of the head, cervical spine, and maxillofacial structures were performed using the standard protocol without intravenous contrast. Multiplanar CT image reconstructions of the cervical spine and maxillofacial structures were also generated.  COMPARISON:  CT head 03/03/2012.  FINDINGS: CT HEAD FINDINGS  No evidence for acute infarction, hemorrhage, mass lesion,  hydrocephalus, or extra-axial fluid. No atrophy or white matter disease. Intact calvarium. No acute sinus or mastoid disease.  CT MAXILLOFACIAL FINDINGS  There is a pure LeFort 1 type fracture across the base of the BILATERAL maxillary sinuses. Maxillary sinus air-fluid levels representing blood are present. Nasal turbinate hypertrophy and nasal cavity hemorrhage is evident. Nasal septal fracture with RIGHT-to-LEFT deviation. The lateral and medial pterygoid plates appear intact. There is no mandibular fracture or TMJ dislocation. No frontal or sphenoid sinus air-fluid level. Minor BILATERAL ethmoid sinus fluid. No posttraumatic missing teeth. Multiple prior root canals. Periodontal disease.  No other facial fractures. Zygoma is intact. Nasal bone intact. Orbital walls intact. Air from the LEFT maxillary sinus enters the infratemporal fossa and dissects upward along the LEFT temporalis muscle but there is no orbital emphysema. Globes are intact. Facial soft tissue swelling without visible foreign body or laceration.  CT CERVICAL SPINE FINDINGS  There is no visible cervical spine fracture, traumatic subluxation, prevertebral soft tissue swelling, or intraspinal hematoma. There is mild straightening of the normal cervical lordosis. There is a moderate size calcified disc extrusion at C4-C5 which is  not posttraumatic. Smaller calcified disc protrusion C3-4 also pre-existing. Airway midline. No significant atherosclerosis. No neck masses no lung apex lesion.  IMPRESSION: No acute intracranial findings.  There is a pure LeFort 1 type fracture across the base of the maxillary sinuses and maxilla. BILATERAL maxillary sinus air-fluid levels are present without corresponding blowout injury.  No cervical spine fracture or traumatic subluxation. Cervical spondylosis most notable at C4-C5.  Electronically Signed   By: Elsie Stain M.D.   On: 05/02/2015 10:10   Ct Cervical Spine Wo Contrast  05/02/2015   CLINICAL DATA:   Syncopal episode earlier today resulting in a fall hitting head and face. Lip laceration with facial trauma. Neck pain.  EXAM: CT HEAD WITHOUT CONTRAST  CT MAXILLOFACIAL WITHOUT CONTRAST  CT CERVICAL SPINE WITHOUT CONTRAST  TECHNIQUE: Multidetector CT imaging of the head, cervical spine, and maxillofacial structures were performed using the standard protocol without intravenous contrast. Multiplanar CT image reconstructions of the cervical spine and maxillofacial structures were also generated.  COMPARISON:  CT head 03/03/2012.  FINDINGS: CT HEAD FINDINGS  No evidence for acute infarction, hemorrhage, mass lesion, hydrocephalus, or extra-axial fluid. No atrophy or white matter disease. Intact calvarium. No acute sinus or mastoid disease.  CT MAXILLOFACIAL FINDINGS  There is a pure LeFort 1 type fracture across the base of the BILATERAL maxillary sinuses. Maxillary sinus air-fluid levels representing blood are present. Nasal turbinate hypertrophy and nasal cavity hemorrhage is evident. Nasal septal fracture with RIGHT-to-LEFT deviation. The lateral and medial pterygoid plates appear intact. There is no mandibular fracture or TMJ dislocation. No frontal or sphenoid sinus air-fluid level. Minor BILATERAL ethmoid sinus fluid. No posttraumatic missing teeth. Multiple prior root canals. Periodontal disease.  No other facial fractures. Zygoma is intact. Nasal bone intact. Orbital walls intact. Air from the LEFT maxillary sinus enters the infratemporal fossa and dissects upward along the LEFT temporalis muscle but there is no orbital emphysema. Globes are intact. Facial soft tissue swelling without visible foreign body or laceration.  CT CERVICAL SPINE FINDINGS  There is no visible cervical spine fracture, traumatic subluxation, prevertebral soft tissue swelling, or intraspinal hematoma. There is mild straightening of the normal cervical lordosis. There is a moderate size calcified disc extrusion at C4-C5 which is  not  posttraumatic. Smaller calcified disc protrusion C3-4 also pre-existing. Airway midline. No significant atherosclerosis. No neck masses no lung apex lesion.  IMPRESSION: No acute intracranial findings.  There is a pure LeFort 1 type fracture across the base of the maxillary sinuses and maxilla. BILATERAL maxillary sinus air-fluid levels are present without corresponding blowout injury.  No cervical spine fracture or traumatic subluxation. Cervical spondylosis most notable at C4-C5.   Electronically Signed   By: Elsie Stain M.D.   On: 05/02/2015 10:10   Mr Cervical Spine Wo Contrast  05/02/2015   CLINICAL DATA:  Dizziness and syncope. Fall. Right arm pain and tingling.  EXAM: MRI CERVICAL SPINE WITHOUT CONTRAST  TECHNIQUE: Multiplanar, multisequence MR imaging of the cervical spine was performed. No intravenous contrast was administered.  COMPARISON:  None.  FINDINGS: Motion degraded study which could obscure subtle or small pathology.  No indication of occult fracture. No evidence of ligamentous insufficiency. Normal cord signal and morphology. No spinal canal hematoma.  An incidental hemangioma is noted in the upper C5 body.  No extra-spinal findings to explain. Normal flow related signal loss in the bilateral cervical and carotid arteries.  Degenerative changes:  C2-3: Unremarkable.  C3-4: Mild uncovertebral spurring on the right with early foraminal stenosis.  C4-5: Degenerative disc bulging with superimposed central protrusion which contacts the ventral cord. Uncovertebral spurs and disc causes bilateral foraminal stenosis. Grading is limited by patient motion  C5-6: Circumferential bulging of disc, effacing the ventral CSF. Bilateral uncovertebral spurs with likely mild foraminal stenosis  C6-7: Disc narrowing and circumferential bulging. Uncovertebral spur and disc in the foramina cause advanced bilateral foraminal stenosis. Near complete  effacement of canal CSF without cord compression. This is  consistent with mild spinal canal stenosis  C7-T1:Mild facet arthropathy on the left.  No impingement  IMPRESSION: 1. Mildly motion degraded study. 2. No occult fracture, ligament tear, or cord injury. 3. Mid and lower cervical degenerative disc disease, as above, with advanced bilateral C4-5 and C6-7 foraminal stenosis.   Electronically Signed   By: Marnee Spring M.D.   On: 05/02/2015 13:01   Dg Chest Port 1 View  05/02/2015   CLINICAL DATA:  Dizziness and syncopal episode  EXAM: PORTABLE CHEST - 1 VIEW  COMPARISON:  07/07/2014  FINDINGS: Cardiac shadow is mildly enlarged but stable. Some fullness is noted in the right peritracheal region likely related to the portable technique and stable from the prior exam. The lungs are clear bilaterally. No bony abnormality is seen.  IMPRESSION: No acute abnormality noted.   Electronically Signed   By: Alcide Clever M.D.   On: 05/02/2015 08:56   Ct Maxillofacial Wo Cm  05/02/2015   CLINICAL DATA:  Syncopal episode earlier today resulting in a fall hitting head and face. Lip laceration with facial trauma. Neck pain.  EXAM: CT HEAD WITHOUT CONTRAST  CT MAXILLOFACIAL WITHOUT CONTRAST  CT CERVICAL SPINE WITHOUT CONTRAST  TECHNIQUE: Multidetector CT imaging of the head, cervical spine, and maxillofacial structures were performed using the standard protocol without intravenous contrast. Multiplanar CT image reconstructions of the cervical spine and maxillofacial structures were also generated.  COMPARISON:  CT head 03/03/2012.  FINDINGS: CT HEAD FINDINGS  No evidence for acute infarction, hemorrhage, mass lesion, hydrocephalus, or extra-axial fluid. No atrophy or white matter disease. Intact calvarium. No acute sinus or mastoid disease.  CT MAXILLOFACIAL FINDINGS  There is a pure LeFort 1 type fracture across the base of the BILATERAL maxillary sinuses. Maxillary sinus air-fluid levels representing blood are present. Nasal turbinate hypertrophy and nasal cavity hemorrhage is  evident. Nasal septal fracture with RIGHT-to-LEFT deviation. The lateral and medial pterygoid plates appear intact. There is no mandibular fracture or TMJ dislocation. No frontal or sphenoid sinus air-fluid level. Minor BILATERAL ethmoid sinus fluid. No posttraumatic missing teeth. Multiple prior root canals. Periodontal disease.  No other facial fractures. Zygoma is intact. Nasal bone intact. Orbital walls intact. Air from the LEFT maxillary sinus enters the infratemporal fossa and dissects upward along the LEFT temporalis muscle but there is no orbital emphysema. Globes are intact. Facial soft tissue swelling without visible foreign body or laceration.  CT CERVICAL SPINE FINDINGS  There is no visible cervical spine fracture, traumatic subluxation, prevertebral soft tissue swelling, or intraspinal hematoma. There is mild straightening of the normal cervical lordosis. There is a moderate size calcified disc extrusion at C4-C5 which is  not posttraumatic. Smaller calcified disc protrusion C3-4 also pre-existing. Airway midline. No significant atherosclerosis. No neck masses no lung apex lesion.  IMPRESSION: No acute intracranial findings.  There is a pure LeFort 1 type fracture across the base of the maxillary sinuses and maxilla. BILATERAL maxillary sinus air-fluid levels are present without corresponding blowout injury.  No cervical spine fracture or traumatic subluxation. Cervical spondylosis most notable at C4-C5.   Electronically Signed   By: Elsie Stain M.D.   On: 05/02/2015 10:10    Anti-infectives: Anti-infectives    None      Assessment/Plan: GLF Fever last night,Likely pulmonary congestion. 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 May need cervical decompression in the future but no plans for  this hospitalization LeFort type I fracture-Status post ORIF yesterday Lip laceration-sutures by EDP. Will need to have  sutures removed 2-3 days Syncope-cardiac monitoring. Hold BP meds and lasix.No problems while in hospital VTE prophylaxis-SCD/lovenox FEN-Nothing by mouth for surgery today then advanced to full liquids Dispo-PT evaluating, likely will need rehabilitation   s/p Procedure(s): OPEN REDUCTION INTERNAL FIXATION (ORIF) MAXILLA FRACTURE     LOS: 2 days    Heather Hamilton 05/04/2015

## 2015-05-05 ENCOUNTER — Encounter (HOSPITAL_COMMUNITY): Payer: Self-pay | Admitting: Otolaryngology

## 2015-05-05 DIAGNOSIS — D62 Acute posthemorrhagic anemia: Secondary | ICD-10-CM | POA: Diagnosis not present

## 2015-05-05 LAB — CBC
HCT: 33.3 % — ABNORMAL LOW (ref 36.0–46.0)
HEMOGLOBIN: 10.8 g/dL — AB (ref 12.0–15.0)
MCH: 28.6 pg (ref 26.0–34.0)
MCHC: 32.4 g/dL (ref 30.0–36.0)
MCV: 88.1 fL (ref 78.0–100.0)
Platelets: 169 10*3/uL (ref 150–400)
RBC: 3.78 MIL/uL — AB (ref 3.87–5.11)
RDW: 14.8 % (ref 11.5–15.5)
WBC: 5 10*3/uL (ref 4.0–10.5)

## 2015-05-05 MED ORDER — POLYETHYLENE GLYCOL 3350 17 G PO PACK
17.0000 g | PACK | Freq: Every day | ORAL | Status: DC
  Administered 2015-05-05: 17 g via ORAL
  Filled 2015-05-05: qty 1

## 2015-05-05 MED ORDER — HYDROMORPHONE HCL 1 MG/ML IJ SOLN
0.5000 mg | INTRAMUSCULAR | Status: DC | PRN
  Administered 2015-05-05: 0.5 mg via INTRAVENOUS
  Filled 2015-05-05: qty 1

## 2015-05-05 MED ORDER — HYDROCODONE-ACETAMINOPHEN 7.5-325 MG/15ML PO SOLN
10.0000 mL | ORAL | Status: DC | PRN
  Administered 2015-05-05: 15 mL via ORAL
  Administered 2015-05-05: 10 mL via ORAL
  Administered 2015-05-06 (×3): 15 mL via ORAL
  Filled 2015-05-05 (×5): qty 15

## 2015-05-05 NOTE — Progress Notes (Signed)
Rehab Admissions Coordinator Note:  Patient was screened by Clois Dupes for appropriateness for an Inpatient Acute Rehab Consult per therapy preop.   At this time, we are recommending await therpay update postop to determine rehab venue needs.Clois Dupes 05/05/2015, 7:53 AM  I can be reached at [phone].

## 2015-05-05 NOTE — Progress Notes (Signed)
Noted pt has progressed well with therapy today. Not in need of an inpt rehab admission at this time. [phone]

## 2015-05-05 NOTE — Progress Notes (Addendum)
PROGRESS NOTE  Heather Hamilton WUJ:[id number] DOB: 1966-08-30 DOA: 05/02/2015 PCP: REDMON,NOELLE, PA-C  HPI/Recap of past 47 hours: 49 year old female past history of hypertension who sustained an unwitnessed syncopal episode while in the shower which led to result facial fracture. Admitted to the trauma service. She underwent maxillofacial repair on 6/18. Hospitalists were consulted for syncope. Echocardiogram checked and found to be unrevealing. Patient has had no events of fall on telemetry.  Today, patient doing okay. Feels a little bit better. No dizziness. No other complaints. No further events on [place]   Assessment/Plan: Principal Problem:   Cervical spinal stenosis: Outpatient follow-up with neurosurgery Active Problems: By mouth antibiotics.    LeFort I fracture: Status post repair. On wiring done. Patient on liquid diet plus  Fall from Syncope: given normal echo, young age and no episodes of arrhythmia, I have a very strong suspicion that cause of her syncope was a combination of orthostasis and vasovagal caused by being in hot shower, very poor by mouth intake as of late (patient has been under much stress at home) and anti-hypertensive medications including Lasix. Recommending discontinuation altogether of Lasix and will check orthostatics today and tomorrow before discharge   Central cord syndrome   Hypertension   Faintness   Code Status: * Full code  Family Communication: husband at the bedside  Disposition Plan: Patient felt to not need inpatient rehabilitation. Likely discharge in next 24 hours.   Consultants:  Hospitalists  Neurosurgery  Procedures:  Status post maxillofacial repair done 6/18  Echocardiogram done 6/19: Normal ejection fraction, no significant valvular dysfunction, no reported diastolic dysfunction  Antibiotics:  None   Objective: BP 112/64 mmHg  Pulse 95  Temp(Src) 99.3 F (37.4 C) (Oral)  Resp 18  SpO2 95%  LMP  06/16/2014  Intake/Output Summary (Last 24 hours) at 05/05/15 1534 Last data filed at 05/05/15 0600  Gross per 24 hour  Intake   1863 ml  Output      0 ml  Net   1863 ml   There were no vitals filed for this visit.  Exam:   General:  alert and oriented, fatigued,   Cardiovascular: regular rate and rhythm, S1-S2, soft 206 systolic ejection murmur  Respiratory: clear to auscultation bilaterally  Abdomen: soft, nontender, nondistended, positive bowel sounds  Musculoskeletal: no clubbing or cyanosis or edema   Data Reviewed: Basic Metabolic Panel:  Recent Labs Lab 05/02/15 0900 05/03/15 0426  NA 138 136  K 4.1 3.8  CL 104 105  CO2 20* 19*  GLUCOSE 90 90  BUN 15 10  CREATININE 0.85 0.72  CALCIUM 9.0 8.7*   Liver Function Tests: No results for input(s): AST, ALT, ALKPHOS, BILITOT, PROT, ALBUMIN in the last 168 hours. No results for input(s): LIPASE, AMYLASE in the last 168 hours. No results for input(s): AMMONIA in the last 168 hours. CBC:  Recent Labs Lab 05/02/15 0900 05/03/15 0426 05/05/15 0748  WBC 7.1 6.6 5.0  NEUTROABS 5.7  --   --   HGB 12.4 12.0 10.8*  HCT 37.4 36.9 33.3*  MCV 87.4 89.3 88.1  PLT 154 182 169   Cardiac Enzymes:   No results for input(s): CKTOTAL, CKMB, CKMBINDEX, TROPONINI in the last 168 hours. BNP (last 3 results) No results for input(s): BNP in the last 8760 hours.  ProBNP (last 3 results) No results for input(s): PROBNP in the last 8760 hours.  CBG: No results for input(s): GLUCAP in the last 168 hours.  Recent Results (  from the past 240 hour(s))  Surgical pcr screen     Status: None   Collection Time: 05/03/15 12:24 PM  Result Value Ref Range Status   MRSA, PCR NEGATIVE NEGATIVE Final   Staphylococcus aureus NEGATIVE NEGATIVE Final    Comment:        The Xpert SA Assay (FDA approved for NASAL specimens in patients over 27 years of age), is one component of a comprehensive surveillance program.  Test performance  has been validated by [facility] for patients greater than or equal to 38 year old. It is not intended to diagnose infection nor to guide or monitor treatment.      Studies: No results found.  Scheduled Meds: . enoxaparin (LOVENOX) injection  40 mg Subcutaneous Q24H  . polyethylene glycol  17 g Oral Daily  . pregabalin  75 mg Oral BID    Continuous Infusions:     Time spent: 15 minutes  Hollice Espy  [facility] Pager [phone]. If 7PM-7AM, please contact night-coverage at [email or web address], password [facility] 05/05/2015, 3:34 PM  LOS: 3 days

## 2015-05-05 NOTE — Progress Notes (Signed)
2 Days Post-Op  Subjective: Wire removal  Objective: Vital signs in last 24 hours: Temp:  [98.5 F (36.9 C)-98.8 F (37.1 C)] 98.5 F (36.9 C) (06/20 0554) Pulse Rate:  [77-98] 98 (06/20 0554) Resp:  [18] 18 (06/20 0554) BP: (109-124)/(62-66) 117/66 mmHg (06/20 0554) SpO2:  [93 %-98 %] 98 % (06/20 0554)    Intake/Output from previous day: 06/19 0701 - 06/20 0700 In: 2103 [P.O.:960; I.V.:1143] Out: 800 [Urine:800] Intake/Output this shift:    wires were removed with some difficulty with pain but she tolerated well. her occlusion feels normal. incisions healing well.  Lab Results:   Recent Labs  05/03/15 0426 05/05/15 0748  WBC 6.6 5.0  HGB 12.0 10.8*  HCT 36.9 33.3*  PLT 182 169   BMET  Recent Labs  05/03/15 0426  NA 136  K 3.8  CL 105  CO2 19*  GLUCOSE 90  BUN 10  CREATININE 0.72  CALCIUM 8.7*   PT/INR No results for input(s): LABPROT, INR in the last 72 hours. ABG No results for input(s): PHART, HCO3 in the last 72 hours.  Invalid input(s): PCO2, PO2  Studies/Results: No results found.  Anti-infectives: Anti-infectives    None      Assessment/Plan: s/p Procedure(s): OPEN REDUCTION INTERNAL FIXATION (ORIF) MAXILLA FRACTURE  (Bilateral) she needs to stay on full liquid diet for 3-4 weeks. gargle with peridex and d/c on keflex or clindamycin. f/u in 1 week  LOS: 3 days    Suzanna Obey 05/05/2015

## 2015-05-05 NOTE — Evaluation (Signed)
Occupational Therapy Evaluation Patient Details Name: Heather Hamilton MRN: [id number] DOB: June 11, 1966 Today's Date: 05/05/2015    History of Present Illness The patient is a 49 year old white female who by report had a syncopal event while in the shower. She struck the floor and lacerated her face. She was worked up with a cervical MRI which demonstrated some cervical stenosis. Also found to have LeFort I fracture.   Clinical Impression   PT admitted with s/p fall with lefort I fx s/p ORIF. Pt currently with functional limitiations due to the deficits listed below (see OT problem list). PTA was MOD I. Pt demonstrates bil Ue deficits compared to baseline. Pt will benefit from skilled OT to increase their independence and safety with adls and balance to allow discharge outpatient. Pt could benefit from outpatient for fine motor and balance.     Follow Up Recommendations  Outpatient OT    Equipment Recommendations  Tub/shower seat    Recommendations for Other Services       Precautions / Restrictions Precautions Precautions: Fall Precaution Comments: UEs very sensitive Restrictions Weight Bearing Restrictions: No      Mobility Bed Mobility Overal bed mobility: Modified Independent             General bed mobility comments: received in chair  Transfers Overall transfer level: Needs assistance Equipment used: Rolling walker (2 wheeled);None Transfers: Sit to/from Stand Sit to Stand: Min guard         General transfer comment: No physical assist required    Balance Overall balance assessment: Needs assistance Sitting-balance support: No upper extremity supported Sitting balance-Leahy Scale: Good     Standing balance support: No upper extremity supported;During functional activity Standing balance-Leahy Scale: Fair                              ADL Overall ADL's : Needs assistance/impaired   Eating/Feeding Details (indicate cue type and reason):  liquids only due to oral surg at this time Grooming: Wash/dry face;Wash/dry hands;Min guard;Sitting Grooming Details (indicate cue type and reason): pt required sitting due to fatigue and balance Upper Body Bathing: Min guard;Sitting Upper Body Bathing Details (indicate cue type and reason): required seated position to shower Lower Body Bathing: Min guard;Sit to/from stand Lower Body Bathing Details (indicate cue type and reason): incr time and effort due to UE weakness Upper Body Dressing : Min guard;Sitting   Lower Body Dressing: Min guard;Sit to/from stand Lower Body Dressing Details (indicate cue type and reason): several attempts to thread LE into pants Toilet Transfer: Minimal assistance;Ambulation;Regular Teacher, adult education Details (indicate cue type and reason): hand held (A) due to urgency Toileting- Clothing Manipulation and Hygiene: Min guard;Sitting/lateral lean Toileting - Clothing Manipulation Details (indicate cue type and reason): Pt with urgency and loose bowels Tub/ Shower Transfer: Minimal assistance;Shower Field seismologist Details (indicate cue type and reason): pt required seated position for shower this session. pt with decr fine motor and needing some assistance with opening container and holding hand held shower Functional mobility during ADLs: Min guard General ADL Comments: Pt requesting to shower and RN in room to allow patient to shower. pt needed (A) with hand held shower head     Vision     Perception     Praxis      Pertinent Vitals/Pain Pain Assessment: Faces Faces Pain Scale: Hurts even more Pain Location: face ( edema noted) Pain Descriptors / Indicators: Discomfort Pain Intervention(s): Repositioned;Monitored  during session     Hand Dominance Right   Extremity/Trunk Assessment Upper Extremity Assessment Upper Extremity Assessment: RUE deficits/detail;LUE deficits/detail RUE Deficits / Details: reports tingling and buring in UE-  reports its much better than yesterday- decr grasp RUE Sensation: decreased light touch RUE Coordination: decreased fine motor LUE Deficits / Details: reports tingling and buring in UE- reports its much better than yesterday LUE Sensation: decreased light touch LUE Coordination: decreased fine motor   Lower Extremity Assessment Lower Extremity Assessment: Generalized weakness   Cervical / Trunk Assessment Cervical / Trunk Assessment: Other exceptions Cervical / Trunk Exceptions: facial fx with surg   Communication Communication Communication: Other (comment) (limited by wired mouth)   Cognition Arousal/Alertness: Awake/alert Behavior During Therapy: Flat affect Overall Cognitive Status: Within Functional Limits for tasks assessed                     General Comments       Exercises       Shoulder Instructions      Home Living Family/patient expects to be discharged to:: Private residence Living Arrangements: Spouse/significant other Available Help at Discharge: Family;Available 24 hours/day Type of Home: House Home Access: Stairs to enter [employer] of Steps: 1 Entrance Stairs-Rails: None Home Layout: One level     Bathroom Shower/Tub: Producer, television/film/video: Standard     Home Equipment: Environmental consultant - standard          Prior Functioning/Environment Level of Independence: Independent        Comments: recently lost > 25 pounds on new diet    OT Diagnosis: Generalized weakness;Acute pain   OT Problem List: Decreased strength;Decreased range of motion;Decreased activity tolerance;Impaired balance (sitting and/or standing);Decreased coordination;Pain;Impaired UE functional use   OT Treatment/Interventions: Self-care/ADL training;Therapeutic exercise;Neuromuscular education;DME and/or AE instruction;Therapeutic activities;Patient/family education;Balance training    OT Goals(Current goals can be found in the care plan section) Acute  Rehab OT Goals Patient Stated Goal: to return to doing makeup OT Goal Formulation: With patient/family Time For Goal Achievement: 05/19/15 Potential to Achieve Goals: Good  OT Frequency: Min 3X/week   Barriers to D/C:            Co-evaluation              End of Session Nurse Communication: Mobility status;Precautions  Activity Tolerance: Patient tolerated treatment well Patient left: in chair;with call bell/phone within reach;with family/visitor present   Time: 1139-1209 OT Time Calculation (min): 30 min Charges:  OT General Charges $OT Visit: 1 Procedure OT Evaluation $Initial OT Evaluation Tier I: 1 Procedure OT Treatments $Self Care/Home Management : 8-22 mins G-Codes:    Harolyn Rutherford 2015-05-09, 2:46 PM  Pager: [phone]

## 2015-05-05 NOTE — Progress Notes (Signed)
Patient ID: Heather Hamilton, female   DOB: April 27, 1966, 49 y.o.   MRN: [id number]   LOS: 3 days   Subjective: Still quite swollen. N/T much improved.   Objective: Vital signs in last 24 hours: Temp:  [98.5 F (36.9 C)-99.3 F (37.4 C)] 98.5 F (36.9 C) (06/20 0554) Pulse Rate:  [77-98] 98 (06/20 0554) Resp:  [18] 18 (06/20 0554) BP: (109-124)/(62-67) 117/66 mmHg (06/20 0554) SpO2:  [93 %-98 %] 98 % (06/20 0554)    Physical Exam General appearance: alert and no distress Resp: clear to auscultation bilaterally Cardio: regular rate and rhythm GI: normal findings: bowel sounds normal and soft, non-tender Neuro: Strength 4/5 BUE   Assessment/Plan: Fall Multiple facial fxs s/p ORIF Central cord syndrome -- Lyrica Syncope -- per IM Multiple medical problems -- Home meds FEN -- Oral pain med VTE -- SCD's, Lovenox Dispo -- We'll see how she does with PT/OT today, CIR consult if they still recommend    Freeman Caldron, PA-C Pager: [phone] General Trauma PA Pager: [phone]  05/05/2015

## 2015-05-05 NOTE — Progress Notes (Signed)
Physical Therapy Treatment Patient Details Name: Heather Hamilton MRN: [id number] DOB: 11-25-1965 Today's Date: 05/05/2015    History of Present Illness The patient is a 49 year old white female who by report had a syncopal event while in the shower. She struck the floor and lacerated her face. She was worked up with a cervical MRI which demonstrated some cervical stenosis. Also found to have LeFort I fracture.    PT Comments    Patient progressing well with mobility this session. Patient was able to ambulate 120 ft x2 with and without RW. Spoke with patient and spouse at length regarding safety and concerns for mobility. The are receptive to recommendations and state that they can easily arrange for 24/7 supervision and assist. Anticipate that patient will continue to progress well with mobility but highly recommend min guard/supervision for mobility at this time. Will continue to see and progress as tolerated.   Follow Up Recommendations  Supervision/Assistance - 24 hour;Supervision for mobility/OOB     Equipment Recommendations   Rolling Walker with 5" wheels (pending progress)   Recommendations for Other Services       Precautions / Restrictions Precautions Precautions: Fall Precaution Comments: UEs very sensitive Restrictions Weight Bearing Restrictions: No    Mobility  Bed Mobility               General bed mobility comments: received in chair  Transfers Overall transfer level: Needs assistance Equipment used: Rolling walker (2 wheeled);None Transfers: Sit to/from Stand Sit to Stand: Supervision;Min guard         General transfer comment: No physical assist required  Ambulation/Gait Ambulation/Gait assistance: Supervision;Min guard Ambulation Distance (Feet): 120 Feet (x2  supervision with RW, min guard without device) Assistive device: Rolling walker (2 wheeled);None Gait Pattern/deviations: Step-through pattern;Decreased stride length;Drifts  right/left Gait velocity: decreased Gait velocity interpretation: Below normal speed for age/gender General Gait Details: initially very slow and guarded, VCs for increased cadence, patient was able to improve stability with increased speed.   Stairs            Wheelchair Mobility    Modified Rankin (Stroke Patients Only)       Balance   Sitting-balance support: No upper extremity supported Sitting balance-Leahy Scale: Good     Standing balance support: No upper extremity supported Standing balance-Leahy Scale: Fair                      Cognition Arousal/Alertness: Lethargic Behavior During Therapy: Flat affect Overall Cognitive Status: Within Functional Limits for tasks assessed                      Exercises      General Comments General comments (skin integrity, edema, etc.): denies lightheaded feeling this session. Patient does endorse some visual deficits when attempting to read, she states that she thinks she just knocked things around a bit".  Discussed need for supervision and assist upon acute discharge. Spoke with spouse regarding safety with mobility. Spouse and patient in agreement with recommendation of 24/7 supervision and state that the assist can be easily arranged between friends and family memebers.       Pertinent Vitals/Pain Pain Assessment: Faces Faces Pain Scale: Hurts little more Pain Location: face, mouth Pain Descriptors / Indicators: Grimacing;Discomfort Pain Intervention(s): Monitored during session;Premedicated before session;Repositioned    Home Living                      Prior Function  PT Goals (current goals can now be found in the care plan section) Acute Rehab PT Goals Patient Stated Goal: none stated PT Goal Formulation: With patient Time For Goal Achievement: 05/16/15 Potential to Achieve Goals: Good Progress towards PT goals: Progressing toward goals    Frequency  Min 3X/week     PT Plan Discharge plan needs to be updated    Co-evaluation             End of Session Equipment Utilized During Treatment: Gait belt Activity Tolerance: Patient limited by lethargy;Patient limited by pain Patient left: in chair;with call bell/phone within reach;with family/visitor present     Time: 1259-1316 PT Time Calculation (min) (ACUTE ONLY): 17 min  Charges:  $Gait Training: 8-22 mins                    G CodesFabio Asa 05-31-2015, 1:26 PM Charlotte Crumb, PT DPT  [phone]

## 2015-05-05 NOTE — Progress Notes (Signed)
2 Days Post-Op  Subjective: She is still quite sedated and still not able to tell me if her teeth feel right  Objective: Vital signs in last 24 hours: Temp:  [98.5 F (36.9 C)-99.3 F (37.4 C)] 98.5 F (36.9 C) (06/20 0554) Pulse Rate:  [77-98] 98 (06/20 0554) Resp:  [18] 18 (06/20 0554) BP: (109-124)/(62-67) 117/66 mmHg (06/20 0554) SpO2:  [93 %-98 %] 98 % (06/20 0554)    Intake/Output from previous day: 06/19 0701 - 06/20 0700 In: 2103 [P.O.:960; I.V.:1143] Out: 800 [Urine:800] Intake/Output this shift:    no change in the wires and swelling much better  Lab Results:   Recent Labs  05/02/15 0900 05/03/15 0426  WBC 7.1 6.6  HGB 12.4 12.0  HCT 37.4 36.9  PLT 154 182   BMET  Recent Labs  05/02/15 0900 05/03/15 0426  NA 138 136  K 4.1 3.8  CL 104 105  CO2 20* 19*  GLUCOSE 90 90  BUN 15 10  CREATININE 0.85 0.72  CALCIUM 9.0 8.7*   PT/INR No results for input(s): LABPROT, INR in the last 72 hours. ABG No results for input(s): PHART, HCO3 in the last 72 hours.  Invalid input(s): PCO2, PO2  Studies/Results: No results found.  Anti-infectives: Anti-infectives    None      Assessment/Plan: s/p Procedure(s): OPEN REDUCTION INTERNAL FIXATION (ORIF) MAXILLA FRACTURE  (Bilateral) i am going to release the wires and let her be on a liquid diet for the healing process.   LOS: 3 days    Heather Hamilton 05/05/2015

## 2015-05-06 ENCOUNTER — Inpatient Hospital Stay (HOSPITAL_COMMUNITY): Payer: 59

## 2015-05-06 ENCOUNTER — Encounter (HOSPITAL_COMMUNITY): Payer: Self-pay

## 2015-05-06 LAB — CBC
HCT: 29.6 % — ABNORMAL LOW (ref 36.0–46.0)
HEMOGLOBIN: 9.7 g/dL — AB (ref 12.0–15.0)
MCH: 28.8 pg (ref 26.0–34.0)
MCHC: 32.8 g/dL (ref 30.0–36.0)
MCV: 87.8 fL (ref 78.0–100.0)
PLATELETS: 146 10*3/uL — AB (ref 150–400)
RBC: 3.37 MIL/uL — AB (ref 3.87–5.11)
RDW: 14.6 % (ref 11.5–15.5)
WBC: 3.5 10*3/uL — AB (ref 4.0–10.5)

## 2015-05-06 MED ORDER — CEPHALEXIN 250 MG/5ML PO SUSR
250.0000 mg | Freq: Four times a day (QID) | ORAL | Status: DC
  Administered 2015-05-06 (×3): 250 mg via ORAL
  Filled 2015-05-06 (×5): qty 5

## 2015-05-06 MED ORDER — IOHEXOL 300 MG/ML  SOLN
100.0000 mL | Freq: Once | INTRAMUSCULAR | Status: AC | PRN
  Administered 2015-05-06: 100 mL via INTRAVENOUS

## 2015-05-06 MED ORDER — IOHEXOL 300 MG/ML  SOLN
25.0000 mL | INTRAMUSCULAR | Status: AC
Start: 2015-05-06 — End: 2015-05-06
  Administered 2015-05-06 (×2): 25 mL via ORAL

## 2015-05-06 MED ORDER — HYDROCODONE-ACETAMINOPHEN 7.5-325 MG/15ML PO SOLN: 10.0000 mL | ORAL | Status: DC | PRN

## 2015-05-06 MED ORDER — PREGABALIN 75 MG PO CAPS: 75.0000 mg | ORAL_CAPSULE | Freq: Two times a day (BID) | ORAL | Status: DC

## 2015-05-06 MED ORDER — CEPHALEXIN 250 MG/5ML PO SUSR: 250.0000 mg | Freq: Four times a day (QID) | ORAL | Status: DC

## 2015-05-06 NOTE — Care Management Note (Signed)
Case Management Note  Patient Details  Name: PERLE FISHMAN MRN: [id number] Date of Birth: Sep 07, 1966  Subjective/Objective:       Pt for dc home today with spouse.  PT recommending tub bench for home.               Action/Plan: Pt/spouse decline tub bench at this time.  State they will get one later if needed.    Expected Discharge Date:    05/06/15              Expected Discharge Plan:  Home/Self Care  In-House Referral:     Discharge planning Services  CM Consult  Post Acute Care Choice:    Choice offered to:     DME Arranged:    DME Agency:     HH Arranged:    HH Agency:     Status of Service:  Completed, signed off  Medicare Important Message Given:  No Date Medicare IM Given:    Medicare IM give by:    Date Additional Medicare IM Given:    Additional Medicare Important Message give by:     If discussed at Long Length of Stay Meetings, dates discussed:    Additional Comments:  Quintella Baton, RN, BSN  Trauma/Neuro ICU Case Manager [phone]

## 2015-05-06 NOTE — Discharge Summary (Signed)
Physician Discharge Summary  Patient ID: Heather Hamilton MRN: [id number] DOB/AGE: 51-19-1967 51 y.o.  Admit date: 05/02/2015 Discharge date: 05/06/2015  Discharge Diagnoses Patient Active Problem List   Diagnosis Date Noted  . Acute blood loss anemia 05/05/2015  . Fall 05/02/2015  . LeFort I fracture 05/02/2015  . Cervical spinal stenosis 05/02/2015  . Syncope 05/02/2015  . Central cord syndrome 05/02/2015  . Hypertension 05/02/2015  . Faintness     Consultants Dr. Zannie Cove for internal medicine  Dr. Suzanna Obey for ENT  Dr. Tressie Stalker for neurosurgery   Procedures 6/18 -- Maxillary mandibular fixation and open reduction internal fixation of LeFort I fracture by Dr. Jearld Fenton   HPI: Heather Hamilton to the [facility] via EMS following a syncopal episode and a fall at home.The patient reported getting in the shower, developing lightheadedness, weakness, and sweating, and the next thing she remembered was getting up outside of the shower with massive amount of blood around her.She did not know how long she was unconscious. She called her stepdaughter for help. She complained of facial and neck pain and weakness to both arms.She denied previous symptoms. She fell about 1 month ago but tripped over uneven pavement and there were no syncopal symptoms. She admitted to being on a new diet and takes BP medication and lasix for "fluid." Her work up showed a LeFort 1 type fracture and spondylosis at C4-C5.An MRI of the cervical spine showed C4-C5 and C6-C7 foraminal stenosis.She was admitted to the trauma service and internal medicine, ENT, and neurosurgery were consulted.   Hospital Course: Internal medicine worked the patient up for her syncopal episode and came to the conclusion that this was likely a combination of orthostasis and a vasovagal event. Neurosurgery recommended outpatient surgery for her spinal stenosis. ENT took the patient to surgery for the listed procedure. Her wires  were cut before discharge. Her pain was controlled on oral medications. She was mobilized with physical and occupational therapies and progressed to the point where she could return home. She had an acute blood loss anemia that prompted a delayed workup for other areas of blood loss but this was negative. She was discharged home in good condition in the care of her husband.     Medication List    STOP taking these medications        oxyCODONE 5 MG immediate release tablet  Commonly known as:  Oxy IR/ROXICODONE      TAKE these medications        cephALEXin 250 MG/5ML suspension  Commonly known as:  KEFLEX  Take 5 mLs (250 mg total) by mouth every 6 (six) hours.     HYDROcodone-acetaminophen 7.5-325 mg/15 ml solution  Commonly known as:  HYCET  Take 10-20 mLs by mouth every 4 (four) hours as needed (Pain).     lisinopril 20 MG tablet  Commonly known as:  PRINIVIL,ZESTRIL  Take 20 mg by mouth daily.     methocarbamol 500 MG tablet  Commonly known as:  ROBAXIN  Take 1 tablet (500 mg total) by mouth 2 (two) times daily.     naproxen 500 MG tablet  Commonly known as:  NAPROSYN  Take 1 tablet (500 mg total) by mouth 2 (two) times daily as needed for moderate pain.     pregabalin 75 MG capsule  Commonly known as:  LYRICA  Take 1 capsule (75 mg total) by mouth 2 (two) times daily.  Follow-up Information    Schedule an appointment as soon as possible for a visit with Suzanna Obey, MD.   Specialty:  Otolaryngology   Contact information:   [address] [place] [address] [phone]       Schedule an appointment as soon as possible for a visit with Cristi Loron, MD.   Specialty:  Neurosurgery   Contact information:   [address] [place] [address] [phone]       Call [facility].   Why:  As needed   Contact information:   [address] [place]  [address] [phone]       Signed: Freeman Caldron, PA-C Pager: [phone] General Trauma PA Pager: [phone] 05/06/2015, 3:42 PM

## 2015-05-06 NOTE — Discharge Instructions (Signed)
No driving while taking hydrocodone. °

## 2015-05-06 NOTE — Progress Notes (Signed)
OT NOTE  OT educated patient on fine motor exercises and provided a foam cup with change. Pt with excellent return demo. Pt to have family help purchase putty, puzzles, games and cooking task for fine motor/ strengthening. Pt and family without further questions about fine motor task or goals. Pt encouraged to keep elbows straight and not bent at this time to help with nerve pain.    Mateo Flow   OTR/L Pager: [phone] Office: [phone] .

## 2015-05-06 NOTE — Progress Notes (Signed)
Patient ID: Heather Hamilton, female   DOB: [birth year], 49 y.o.   MRN: [id number]   LOS: 4 days   Subjective: No new c/o.   Objective: Vital signs in last 24 hours: Temp:  [98.4 F (36.9 C)-99.3 F (37.4 C)] 98.4 F (36.9 C) (06/21 0455) Pulse Rate:  [62-95] 62 (06/21 0455) Resp:  [18-19] 19 (06/21 0455) BP: (112-131)/(64-73) 131/73 mmHg (06/21 0455) SpO2:  [95 %-98 %] 95 % (06/21 0455) Last BM Date: 05/05/15   Laboratory  CBC  Recent Labs  05/05/15 0748 05/06/15 0438  WBC 5.0 3.5*  HGB 10.8* 9.7*  HCT 33.3* 29.6*  PLT 169 146*    Physical Exam General appearance: alert and no distress Resp: clear to auscultation bilaterally Cardio: regular rate and rhythm GI: normal findings: bowel sounds normal and soft, non-tender   Assessment/Plan: Fall Multiple facial fxs s/p ORIF ABL anemia -- Down again today. This coupled with hematuria makes me concerned for abd or RP pathology. Will get CT abd/pelvis. Central cord syndrome -- Lyrica Syncope -- per IM Multiple medical problems -- Home meds FEN -- No issues VTE -- SCD's, Lovenox Dispo -- Home if CT ok    Freeman Caldron, PA-C Pager: [phone] General Trauma PA Pager: [phone]  05/06/2015
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
## 2015-05-12 ENCOUNTER — Encounter (HOSPITAL_BASED_OUTPATIENT_CLINIC_OR_DEPARTMENT_OTHER): Payer: Self-pay | Admitting: *Deleted

## 2015-05-13 ENCOUNTER — Ambulatory Visit (HOSPITAL_BASED_OUTPATIENT_CLINIC_OR_DEPARTMENT_OTHER)
Admission: RE | Admit: 2015-05-13 | Discharge: 2015-05-13 | Disposition: A | Discharge status: Home / Self Care | Payer: 59 | Source: Ambulatory Visit | Attending: Otolaryngology | Admitting: Otolaryngology

## 2015-05-13 ENCOUNTER — Ambulatory Visit (HOSPITAL_BASED_OUTPATIENT_CLINIC_OR_DEPARTMENT_OTHER): Payer: 59 | Admitting: Anesthesiology

## 2015-05-13 ENCOUNTER — Encounter (HOSPITAL_BASED_OUTPATIENT_CLINIC_OR_DEPARTMENT_OTHER)
Admission: RE | Disposition: A | Discharge status: Home / Self Care | Payer: Self-pay | Source: Ambulatory Visit | Attending: Otolaryngology

## 2015-05-13 ENCOUNTER — Encounter (HOSPITAL_BASED_OUTPATIENT_CLINIC_OR_DEPARTMENT_OTHER): Payer: Self-pay | Admitting: Certified Registered"

## 2015-05-13 DIAGNOSIS — W19XXXD Unspecified fall, subsequent encounter: Secondary | ICD-10-CM | POA: Diagnosis not present

## 2015-05-13 DIAGNOSIS — K219 Gastro-esophageal reflux disease without esophagitis: Secondary | ICD-10-CM | POA: Insufficient documentation

## 2015-05-13 DIAGNOSIS — Z79891 Long term (current) use of opiate analgesic: Secondary | ICD-10-CM | POA: Diagnosis not present

## 2015-05-13 DIAGNOSIS — S02411D LeFort I fracture, subsequent encounter for fracture with routine healing: Secondary | ICD-10-CM | POA: Insufficient documentation

## 2015-05-13 DIAGNOSIS — I10 Essential (primary) hypertension: Secondary | ICD-10-CM | POA: Diagnosis not present

## 2015-05-13 DIAGNOSIS — Z79899 Other long term (current) drug therapy: Secondary | ICD-10-CM | POA: Insufficient documentation

## 2015-05-13 DIAGNOSIS — Z792 Long term (current) use of antibiotics: Secondary | ICD-10-CM | POA: Diagnosis not present

## 2015-05-13 HISTORY — DX: Unspecified fall, initial encounter: Y92.009

## 2015-05-13 HISTORY — DX: Unspecified place in unspecified non-institutional (private) residence as the place of occurrence of the external cause: W19.XXXA

## 2015-05-13 HISTORY — PX: MANDIBULAR HARDWARE REMOVAL: SHX5205

## 2015-05-13 SURGERY — REMOVAL, HARDWARE, MANDIBLE
Anesthesia: General | Site: Mouth

## 2015-05-13 MED ORDER — BUPIVACAINE HCL (PF) 0.25 % IJ SOLN
INTRAMUSCULAR | Status: AC
  Filled 2015-05-13: qty 30

## 2015-05-13 MED ORDER — LACTATED RINGERS IV SOLN
INTRAVENOUS | Status: DC
  Administered 2015-05-13: 09:00:00 via INTRAVENOUS

## 2015-05-13 MED ORDER — BACITRACIN ZINC 500 UNIT/GM EX OINT
TOPICAL_OINTMENT | CUTANEOUS | Status: AC
  Filled 2015-05-13: qty 3.6

## 2015-05-13 MED ORDER — PROPOFOL 10 MG/ML IV BOLUS
INTRAVENOUS | Status: DC | PRN
  Administered 2015-05-13: 100 mg via INTRAVENOUS
  Administered 2015-05-13: 40 mg via INTRAVENOUS

## 2015-05-13 MED ORDER — FENTANYL CITRATE (PF) 100 MCG/2ML IJ SOLN: 25.0000 ug | INTRAMUSCULAR | Status: DC | PRN

## 2015-05-13 MED ORDER — FENTANYL CITRATE (PF) 100 MCG/2ML IJ SOLN
INTRAMUSCULAR | Status: AC
  Filled 2015-05-13: qty 4

## 2015-05-13 MED ORDER — GLYCOPYRROLATE 0.2 MG/ML IJ SOLN: 0.2000 mg | Freq: Once | INTRAMUSCULAR | Status: DC | PRN

## 2015-05-13 MED ORDER — FENTANYL CITRATE (PF) 100 MCG/2ML IJ SOLN
50.0000 ug | INTRAMUSCULAR | Status: DC | PRN
  Administered 2015-05-13: 50 ug via INTRAVENOUS

## 2015-05-13 MED ORDER — LIDOCAINE-EPINEPHRINE 1 %-1:100000 IJ SOLN
INTRAMUSCULAR | Status: AC
  Filled 2015-05-13: qty 1

## 2015-05-13 MED ORDER — MIDAZOLAM HCL 2 MG/2ML IJ SOLN
1.0000 mg | INTRAMUSCULAR | Status: DC | PRN
  Administered 2015-05-13: 1 mg via INTRAVENOUS

## 2015-05-13 MED ORDER — SCOPOLAMINE 1 MG/3DAYS TD PT72: 1.0000 | MEDICATED_PATCH | Freq: Once | TRANSDERMAL | Status: DC | PRN

## 2015-05-13 MED ORDER — MIDAZOLAM HCL 2 MG/2ML IJ SOLN
INTRAMUSCULAR | Status: AC
  Filled 2015-05-13: qty 2

## 2015-05-13 MED ORDER — OXYCODONE HCL 5 MG/5ML PO SOLN
5.0000 mg | Freq: Once | ORAL | Status: AC
  Administered 2015-05-13: 5 mg via ORAL

## 2015-05-13 MED ORDER — OXYCODONE HCL 5 MG/5ML PO SOLN
ORAL | Status: AC
  Filled 2015-05-13: qty 5

## 2015-05-13 MED ORDER — ONDANSETRON HCL 4 MG/2ML IJ SOLN
INTRAMUSCULAR | Status: DC | PRN
  Administered 2015-05-13: 4 mg via INTRAVENOUS

## 2015-05-13 SURGICAL SUPPLY — 22 items
BLADE SURG 15 STRL LF DISP TIS (BLADE) ×1 IMPLANT
BLADE SURG 15 STRL SS (BLADE) ×2
CANISTER SUCT 1200ML W/VALVE (MISCELLANEOUS) ×2 IMPLANT
CORDS BIPOLAR (ELECTRODE) IMPLANT
COVER MAYO STAND STRL (DRAPES) ×2 IMPLANT
DECANTER SPIKE VIAL GLASS SM (MISCELLANEOUS) ×2 IMPLANT
GLOVE SS BIOGEL STRL SZ 7.5 (GLOVE) ×1 IMPLANT
GLOVE SUPERSENSE BIOGEL SZ 7.5 (GLOVE) ×1
GOWN STRL REUS W/ TWL LRG LVL3 (GOWN DISPOSABLE) ×1 IMPLANT
GOWN STRL REUS W/TWL LRG LVL3 (GOWN DISPOSABLE) ×2
MARKER SKIN DUAL TIP RULER LAB (MISCELLANEOUS) IMPLANT
NDL PRECISIONGLIDE 27X1.5 (NEEDLE) IMPLANT
NEEDLE PRECISIONGLIDE 27X1.5 (NEEDLE) IMPLANT
PACK BASIN DAY SURGERY FS (CUSTOM PROCEDURE TRAY) ×2 IMPLANT
SCISSORS WIRE ANG 4 3/4 DISP (INSTRUMENTS) IMPLANT
SHEET MEDIUM DRAPE 40X70 STRL (DRAPES) ×2 IMPLANT
SUT CHROMIC 3 0 PS 2 (SUTURE) IMPLANT
SUT CHROMIC 4 0 PS 2 18 (SUTURE) IMPLANT
SYR CONTROL 10ML LL (SYRINGE) IMPLANT
TOWEL OR 17X24 6PK STRL BLUE (TOWEL DISPOSABLE) ×4 IMPLANT
TUBE CONNECTING 20X1/4 (TUBING) ×2 IMPLANT
YANKAUER SUCT BULB TIP NO VENT (SUCTIONS) ×2 IMPLANT

## 2015-05-13 NOTE — Transfer of Care (Signed)
Immediate Anesthesia Transfer of Care Note  Patient: Heather Hamilton  Procedure(s) Performed: Procedure(s): MANDIBILE HARDWARE REMOVAL (N/A)  Patient Location: PACU  Anesthesia Type:General  Level of Consciousness: awake, alert , oriented and patient cooperative  Airway & Oxygen Therapy: Patient Spontanous Breathing and Patient connected to face mask oxygen  Post-op Assessment: Report given to RN, Post -op Vital signs reviewed and stable and Patient moving all extremities  Post vital signs: Reviewed and stable  Last Vitals:  Filed Vitals:   05/13/15 0855  BP: 151/86  Pulse: 66  Temp: 36.2 C  Resp: 20    Complications: No apparent anesthesia complications

## 2015-05-13 NOTE — Discharge Instructions (Addendum)
Gargle with salt water twice a day. Continue to stay on a liquid diet which should continue for about 2-3 more weeks. Call if there's any increasing pain, swelling, or redness. Follow-up in about 1 week in the office.    Post Anesthesia Home Care Instructions  Activity: Get plenty of rest for the remainder of the day. A responsible adult should stay with you for 24 hours following the procedure.  For the next 24 hours, DO NOT: -Drive a car -Advertising copywriterperate machinery -Drink alcoholic beverages -Take any medication unless instructed by your physician -Make any legal decisions or sign important papers.  Meals: Start with liquid foods such as gelatin or soup. Progress to regular foods as tolerated. Avoid greasy, spicy, heavy foods. If nausea and/or vomiting occur, drink only clear liquids until the nausea and/or vomiting subsides. Call your physician if vomiting continues.  Special Instructions/Symptoms: Your throat may feel dry or sore from the anesthesia or the breathing tube placed in your throat during surgery. If this causes discomfort, gargle with warm salt water. The discomfort should disappear within 24 hours.  If you had a scopolamine patch placed behind your ear for the management of post- operative nausea and/or vomiting:  1. The medication in the patch is effective for 72 hours, after which it should be removed.  Wrap patch in a tissue and discard in the trash. Wash hands thoroughly with soap and water. 2. You may remove the patch earlier than 72 hours if you experience unpleasant side effects which may include dry mouth, dizziness or visual disturbances. 3. Avoid touching the patch. Wash your hands with soap and water after contact with the patch.  Call your surgeon if you experience:   1.  Fever over 101.0. 2.  Inability to urinate. 3.  Nausea and/or vomiting. 4.  Extreme swelling or bruising at the surgical site. 5.  Continued bleeding from the incision. 6.  Increased pain,  redness or drainage from the incision. 7.  Problems related to your pain medication. 8. Any change in color, movement and/or sensation 9. Any problems and/or concerns

## 2015-05-13 NOTE — H&P (Signed)
Lum KeasWendy G Resende is an 49 y.o. female.   Chief Complaint: maxilla fxHPI: hx of ORIF of maxilla fx and needs screws removed  Past Medical History  Diagnosis Date  . Hypertension   . GERD (gastroesophageal reflux disease)   . Fall at home     Past Surgical History  Procedure Laterality Date  . Cesarean section    . Cholecystectomy N/A 07/09/2014    Procedure: LAPAROSCOPIC CHOLECYSTECTOMY;  Surgeon: Liz MaladyBurke E Thompson, MD;  [facility];  Service: General;  Laterality: N/A;  . Orif mandibular fracture Bilateral 05/03/2015    Procedure: OPEN REDUCTION INTERNAL FIXATION (ORIF) MAXILLA FRACTURE ;  Surgeon: Suzanna ObeyJohn Lincy Belles, MD;  [facility];  Service: ENT;  Laterality: Bilateral;    History reviewed. No pertinent family history. Social History:  reports that she has never smoked. She has never used smokeless tobacco. She reports that she does not drink alcohol or use illicit drugs.  Allergies:  Allergies  Allergen Reactions  . Codeine Nausea Only and Other (See Comments)    Makes very heated     Medications Prior to Admission  Medication Sig Dispense Refill  . cephALEXin (KEFLEX) 250 MG/5ML suspension Take 5 mLs (250 mg total) by mouth every 6 (six) hours. 100 mL 0  . HYDROcodone-acetaminophen (HYCET) 7.5-325 mg/15 ml solution Take 10-20 mLs by mouth every 4 (four) hours as needed (Pain). 500 mL 0  . lisinopril (PRINIVIL,ZESTRIL) 20 MG tablet Take 20 mg by mouth daily.    . pregabalin (LYRICA) 75 MG capsule Take 1 capsule (75 mg total) by mouth 2 (two) times daily. 60 capsule 0    No results found for this or any previous visit (from the past 48 hour(s)). No results found.  Review of Systems  Constitutional: Negative.   HENT: Negative.   Eyes: Negative.   Respiratory: Negative.   Cardiovascular: Negative.   Skin: Negative.     Blood pressure 151/86, pulse 66, temperature 97.2 F (36.2 C), temperature source Oral, resp. rate 20, height 5\' 5"  (1.651 m), weight 77.565 kg (171 lb),  last menstrual period 06/16/2014, SpO2 100 %. Physical Exam  Constitutional: She appears well-developed and well-nourished.  HENT:  Head: Normocephalic.  Nose: Nose normal.  Eyes: Conjunctivae are normal. Pupils are equal, round, and reactive to light.  Neck: Normal range of motion. Neck supple.  Cardiovascular: Normal rate.   Respiratory: Effort normal.  GI: Soft.  Musculoskeletal: Normal range of motion.     Assessment/Plan Maxilla fx- discussed procedure and ready to proceed  Suzanna ObeyBYERS, Ruby Dilone 05/13/2015, 10:03 AM

## 2015-05-13 NOTE — Anesthesia Procedure Notes (Signed)
Date/Time: 05/13/2015 10:21 AM Performed by: Curly ShoresRAFT, Najmo Pardue W Pre-anesthesia Checklist: Patient identified, Emergency Drugs available, Suction available, Patient being monitored and Timeout performed Patient Re-evaluated:Patient Re-evaluated prior to inductionOxygen Delivery Method: Circle system utilized Preoxygenation: Pre-oxygenation with 100% oxygen Intubation Type: IV induction Ventilation: Mask ventilation without difficulty Dental Injury: Teeth and Oropharynx as per pre-operative assessment

## 2015-05-13 NOTE — Op Note (Signed)
Preop/postop diagnosis: LeFort I fracture Procedure: Removal of bicortical screws Anesthesia: Gen. Estimated blood loss less than 5 mL Indications: 49 year old with a LeFort I fracture repaired with open reduction internal fixation. She had wires initially they were cut a few days after the procedure and now she is doing well with good occlusion. She would likely screws removed as they are slightly bothering her. She is informed risks and benefits of the procedure and options were discussed all questions were answered and consent was obtained. Procedure: Patient was taken to the operating room placed in the supine position after general mask ventilation anesthesia was placed in the supine position. All 4 screws were identified easily and removed with the screwdriver. The sites bled very temporarily minimally. The teeth appear to be in good repair and the incisions in her lingual sulcus were slightly open but healing. Patient was then awakened brought to recovery room in stable condition counts correct

## 2015-05-13 NOTE — Anesthesia Preprocedure Evaluation (Addendum)
Anesthesia Evaluation  Patient identified by MRN, date of birth, ID band Patient awake  General Assessment Comment:History noted. CE  Reviewed: Allergy & Precautions, NPO status , Patient's Chart, lab work & pertinent test results  Airway Mallampati: II  TM Distance: >3 FB Neck ROM: Limited    Dental   Pulmonary neg pulmonary ROS,  breath sounds clear to auscultation        Cardiovascular hypertension, Rhythm:Regular Rate:Normal     Neuro/Psych    GI/Hepatic Neg liver ROS, GERD-  ,  Endo/Other  negative endocrine ROS  Renal/GU negative Renal ROS     Musculoskeletal   Abdominal   Peds  Hematology   Anesthesia Other Findings   Reproductive/Obstetrics                         Anesthesia Physical Anesthesia Plan  ASA: III  Anesthesia Plan: General   Post-op Pain Management:    Induction: Intravenous  Airway Management Planned: Mask  Additional Equipment:   Intra-op Plan:   Post-operative Plan:   Informed Consent: I have reviewed the patients History and Physical, chart, labs and discussed the procedure including the risks, benefits and alternatives for the proposed anesthesia with the patient or authorized representative who has indicated his/her understanding and acceptance.   Dental advisory given  Plan Discussed with: Anesthesiologist, CRNA and Surgeon  Anesthesia Plan Comments:        Anesthesia Quick Evaluation

## 2015-05-13 NOTE — Anesthesia Postprocedure Evaluation (Signed)
  Anesthesia Post-op Note  Patient: Heather Hamilton  Procedure(s) Performed: Procedure(s): MANDIBILE HARDWARE REMOVAL (N/A)  Patient Location: PACU  Anesthesia Type:General  Level of Consciousness: awake  Airway and Oxygen Therapy: Patient Spontanous Breathing  Post-op Pain: mild  Post-op Assessment: Post-op Vital signs reviewed              Post-op Vital Signs: Reviewed  Last Vitals:  Filed Vitals:   05/13/15 1215  BP:   Pulse: 78  Temp: 37 C  Resp: 18    Complications: No apparent anesthesia complications

## 2015-05-14 ENCOUNTER — Encounter (HOSPITAL_BASED_OUTPATIENT_CLINIC_OR_DEPARTMENT_OTHER): Payer: Self-pay | Admitting: Otolaryngology

## 2015-09-01 ENCOUNTER — Other Ambulatory Visit: Payer: Self-pay

## 2015-09-01 DIAGNOSIS — Z1231 Encounter for screening mammogram for malignant neoplasm of breast: Secondary | ICD-10-CM

## 2015-09-15 ENCOUNTER — Ambulatory Visit
Admission: RE | Admit: 2015-09-15 | Discharge: 2015-09-15 | Disposition: A | Discharge status: Home / Self Care | Payer: 59 | Source: Ambulatory Visit

## 2015-09-15 DIAGNOSIS — Z1231 Encounter for screening mammogram for malignant neoplasm of breast: Secondary | ICD-10-CM

## 2016-09-19 IMAGING — CR DG CHEST 1V PORT
1 series · 1 of 1 positions shown · non-contrast
Comparison: 07/07/2014

CLINICAL DATA: Dizziness and syncopal episode

EXAM:
PORTABLE CHEST - 1 VIEW

[AP]
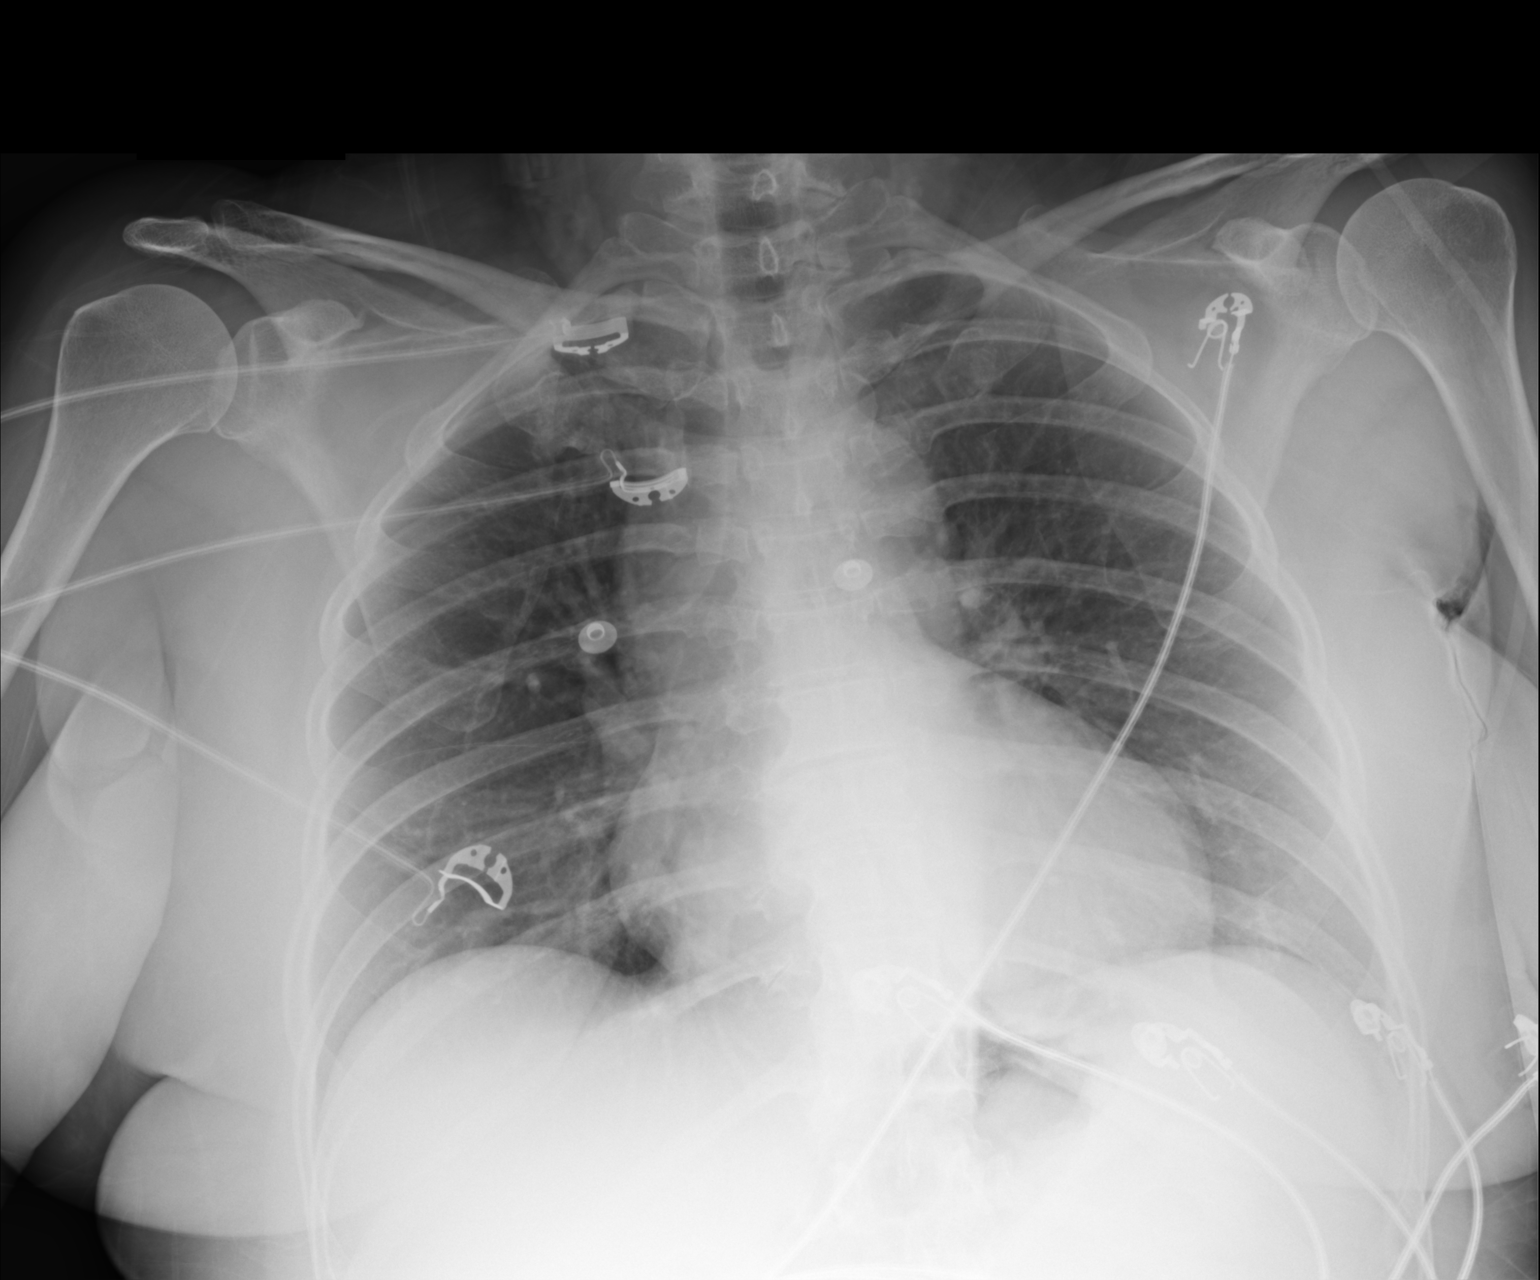

[1 of 1 positions shown; findings below may reference images not displayed]

FINDINGS: Cardiac shadow is mildly enlarged but stable. Some fullness is noted
in the right peritracheal region likely related to the portable
technique and stable from the prior exam. The lungs are clear
bilaterally. No bony abnormality is seen.
IMPRESSION: No acute abnormality noted.

## 2016-09-19 IMAGING — MR MR CERVICAL SPINE W/O CM
4 of 6 series · 19 of 48 positions shown · non-contrast
Comparison: None.

CLINICAL DATA: Dizziness and syncope. Fall. Right arm pain and
tingling.

EXAM:
MRI CERVICAL SPINE WITHOUT CONTRAST
TECHNIQUE: Multiplanar, multisequence MR imaging of the cervical spine was
performed. No intravenous contrast was administered.

[Series 200: T2 · sagittal · 3.0mm · 0.35mm/px · 5 of 13 slices shown (1 of 2)]
[im 1/13]
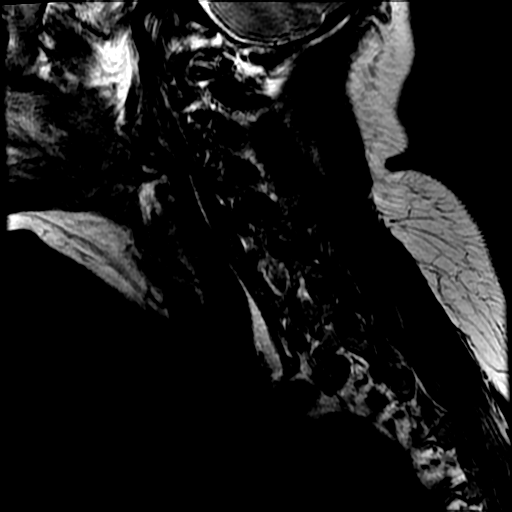
[im 4/13]
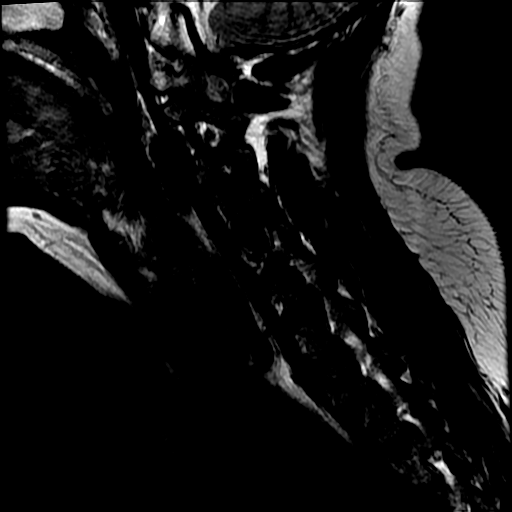
[im 7/13]
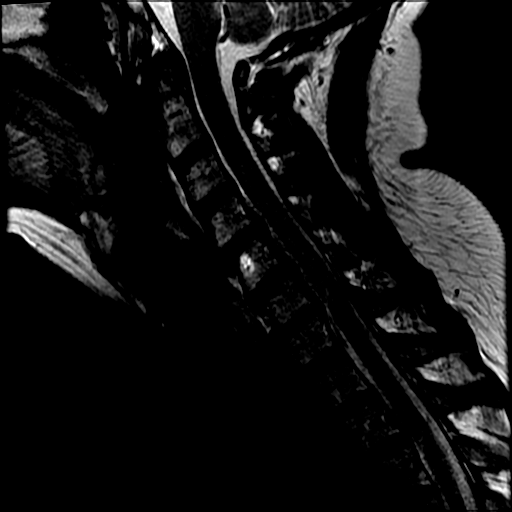
[im 10/13]
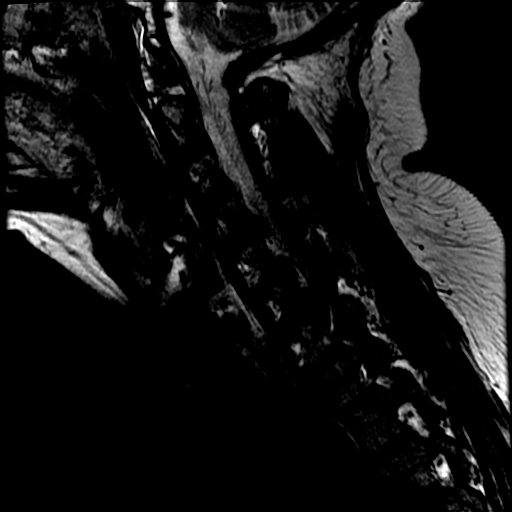
[im 13/13]
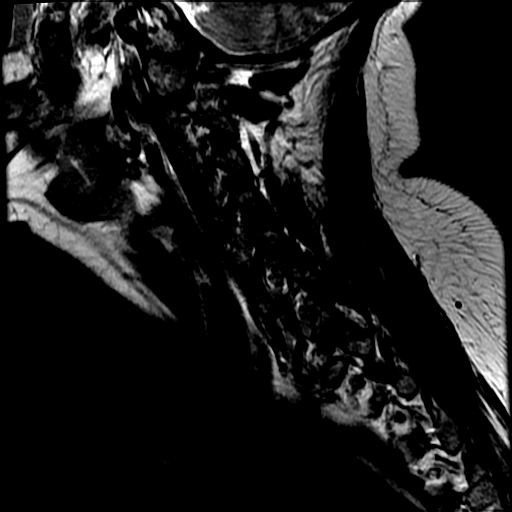

[Series 300: FLAIR · sagittal · 3.0mm · 0.35mm/px · 3 of 13 slices shown]
[im 1/13]
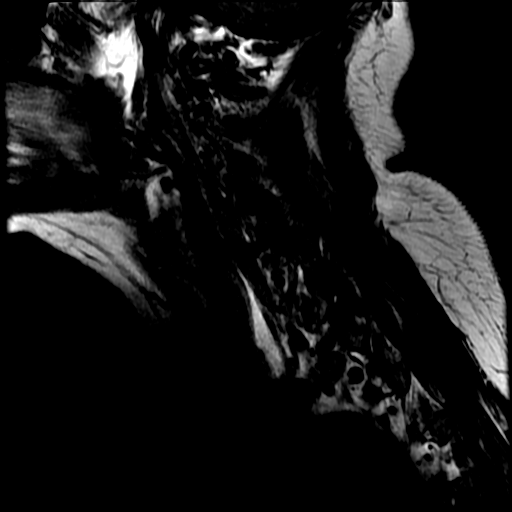
[im 7/13]
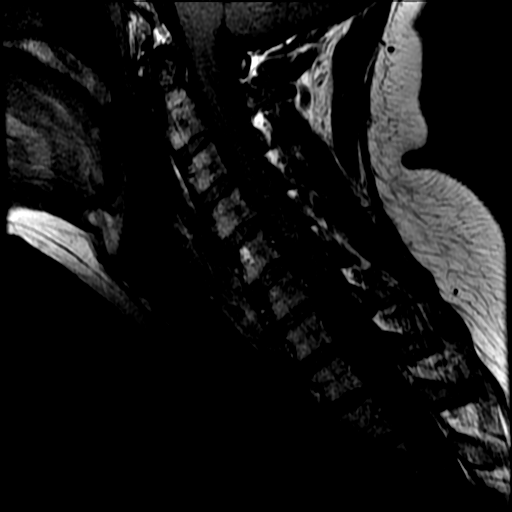
[im 13/13]
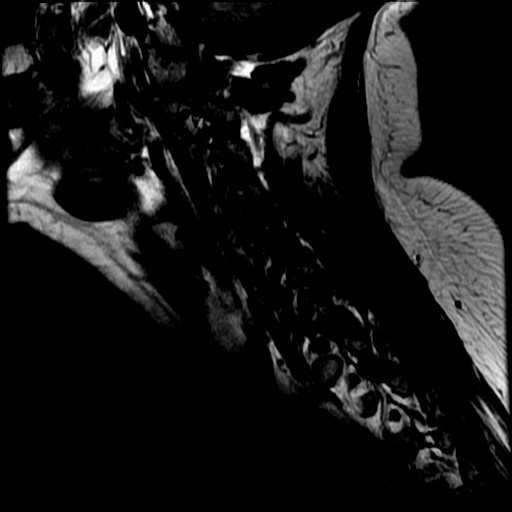

[Series 600: T2 · axial · 3.0mm · 0.35mm/px · z∈[-41,+47]mm · 8 of 33 slices shown (2 of 2)]
[im 1/33]
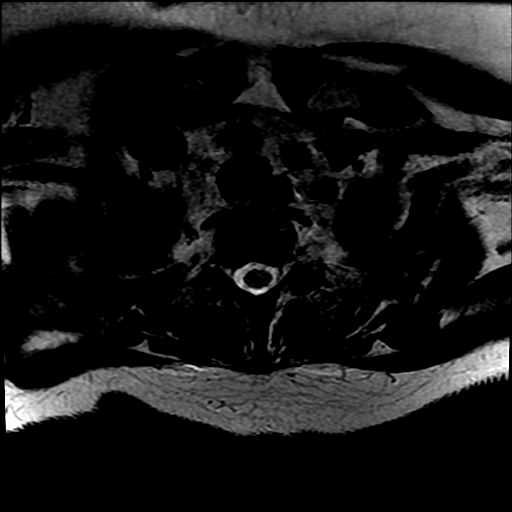
[im 6/33]
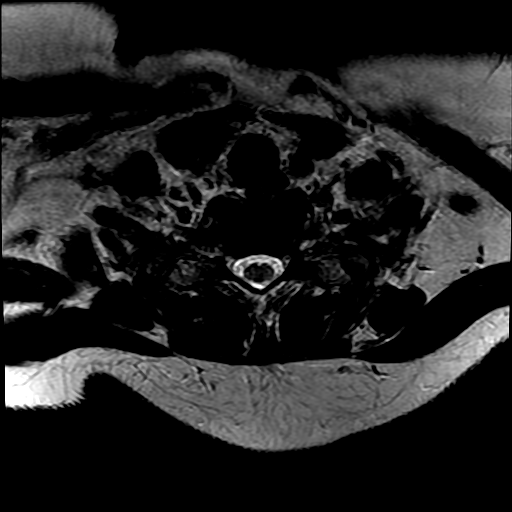
[im 9/33]
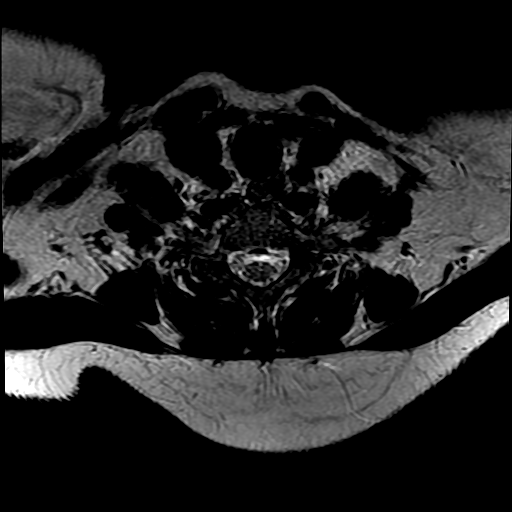
[im 15/33]
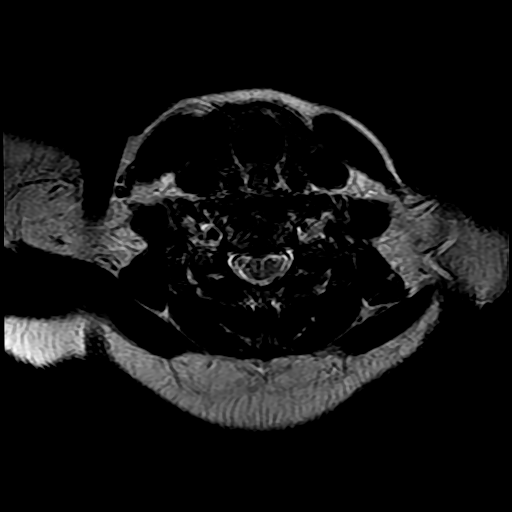
[im 18/33]
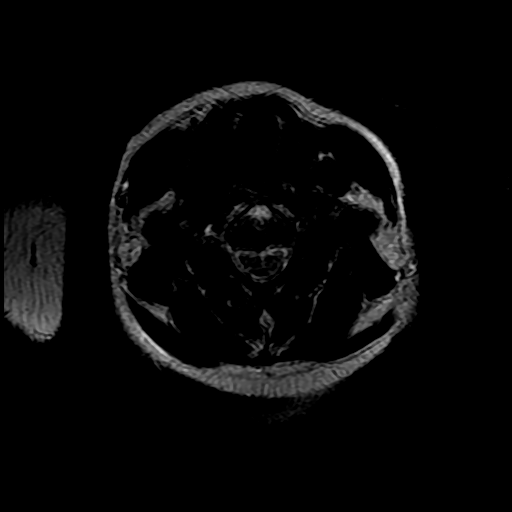
[im 24/33]
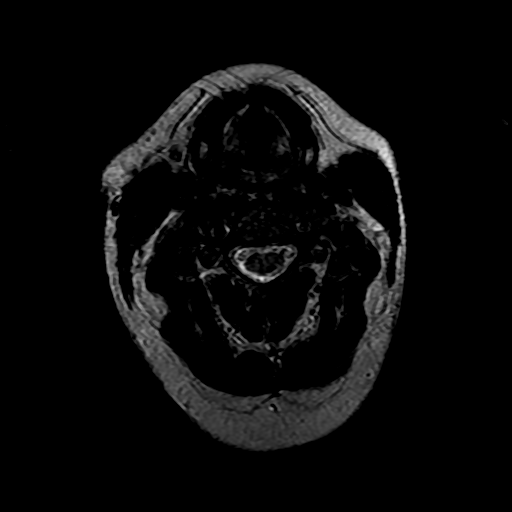
[im 27/33]
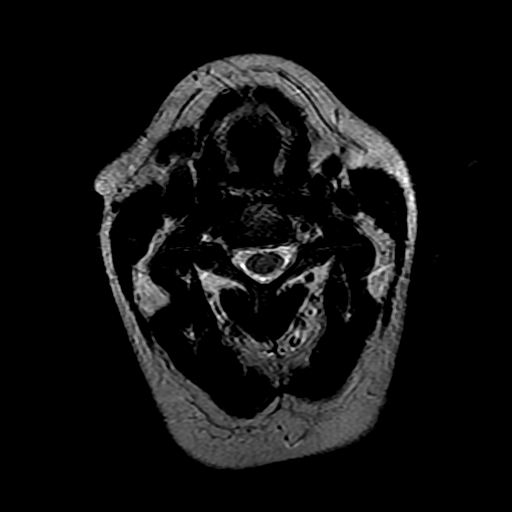
[im 30/33]
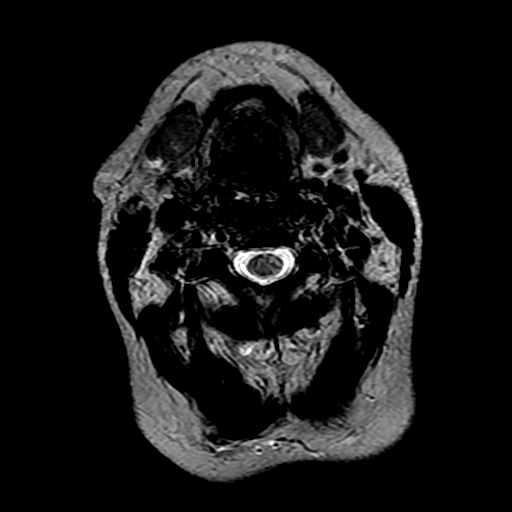

[Series 700: STIR · sagittal · 3.0mm · 0.35mm/px · 3 of 13 slices shown]
[im 1/13]
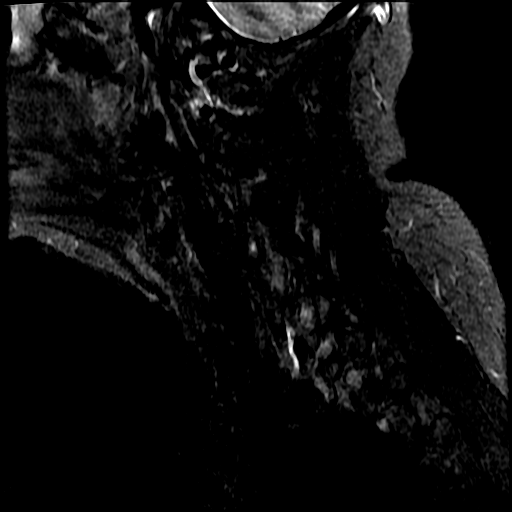
[im 7/13]
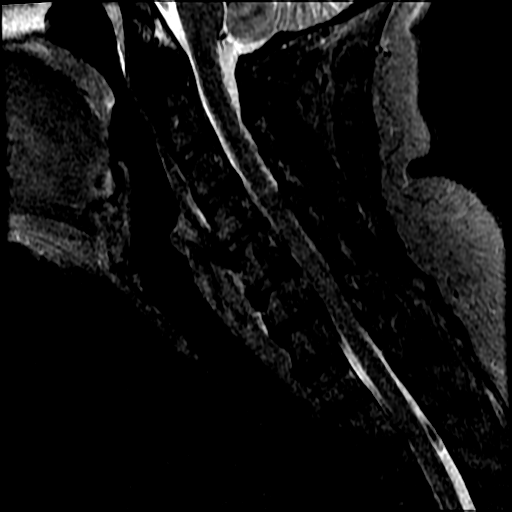
[im 13/13]
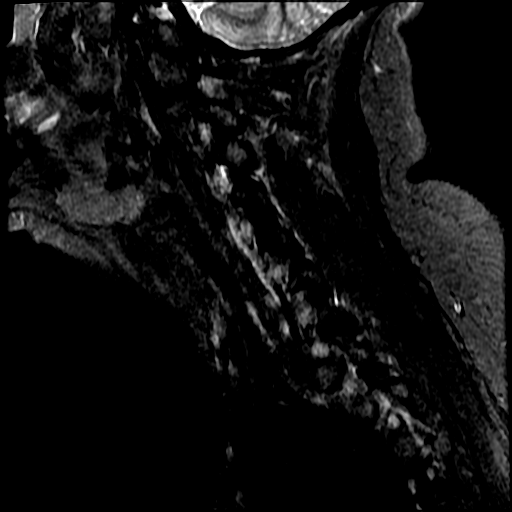

[19 of 48 positions shown; findings below may reference images not displayed]

FINDINGS: Motion degraded study which could obscure subtle or small pathology.

No indication of occult fracture. No evidence of ligamentous
insufficiency. Normal cord signal and morphology. No spinal canal
hematoma.

An incidental hemangioma is noted in the upper C5 body.

No extra-spinal findings to explain. Normal flow related signal loss
in the bilateral cervical and carotid arteries.

Degenerative changes:

C2-3: Unremarkable.

C3-4: Mild uncovertebral spurring on the right with early foraminal
stenosis.

C4-5: Degenerative disc bulging with superimposed central protrusion
which contacts the ventral cord. Uncovertebral spurs and disc causes
bilateral foraminal stenosis. Grading is limited by patient motion

C5-6: Circumferential bulging of disc, effacing the ventral CSF.
Bilateral uncovertebral spurs with likely mild foraminal stenosis

C6-7: Disc narrowing and circumferential bulging. Uncovertebral spur
and disc in the foramina cause advanced bilateral foraminal
stenosis. Near complete effacement of canal CSF without cord
compression. This is consistent with mild spinal canal stenosis

C7-T1:Mild facet arthropathy on the left.  No impingement
IMPRESSION: 1. Mildly motion degraded study.
2. No occult fracture, ligament tear, or cord injury.
3. Mid and lower cervical degenerative disc disease, as above, with
advanced bilateral C4-5 and C6-7 foraminal stenosis.

## 2016-09-29 ENCOUNTER — Other Ambulatory Visit: Payer: Self-pay | Admitting: Family Medicine

## 2016-09-29 ENCOUNTER — Ambulatory Visit
Admission: RE | Admit: 2016-09-29 | Discharge: 2016-09-29 | Disposition: A | Discharge status: Home / Self Care | Payer: 59 | Source: Ambulatory Visit | Attending: Family Medicine | Admitting: Family Medicine

## 2016-09-29 ENCOUNTER — Emergency Department (HOSPITAL_COMMUNITY): Payer: 59 | Admitting: Certified Registered"

## 2016-09-29 ENCOUNTER — Encounter (HOSPITAL_COMMUNITY): Payer: Self-pay | Admitting: *Deleted

## 2016-09-29 ENCOUNTER — Observation Stay (HOSPITAL_COMMUNITY)
Admission: EM | Admit: 2016-09-29 | Discharge: 2016-10-01 | Disposition: A | Discharge status: Home / Self Care | Payer: 59 | Attending: General Surgery | Admitting: General Surgery

## 2016-09-29 ENCOUNTER — Encounter (HOSPITAL_COMMUNITY)
Admission: EM | Disposition: A | Discharge status: Home / Self Care | Payer: Self-pay | Source: Home / Self Care | Attending: Emergency Medicine

## 2016-09-29 DIAGNOSIS — I10 Essential (primary) hypertension: Secondary | ICD-10-CM | POA: Diagnosis not present

## 2016-09-29 DIAGNOSIS — K219 Gastro-esophageal reflux disease without esophagitis: Secondary | ICD-10-CM | POA: Insufficient documentation

## 2016-09-29 DIAGNOSIS — R1031 Right lower quadrant pain: Secondary | ICD-10-CM

## 2016-09-29 DIAGNOSIS — Z885 Allergy status to narcotic agent status: Secondary | ICD-10-CM | POA: Diagnosis not present

## 2016-09-29 DIAGNOSIS — K3580 Unspecified acute appendicitis: Secondary | ICD-10-CM | POA: Diagnosis not present

## 2016-09-29 DIAGNOSIS — K660 Peritoneal adhesions (postprocedural) (postinfection): Secondary | ICD-10-CM | POA: Diagnosis not present

## 2016-09-29 DIAGNOSIS — K3589 Other acute appendicitis without perforation or gangrene: Secondary | ICD-10-CM

## 2016-09-29 HISTORY — PX: LAPAROSCOPIC APPENDECTOMY: SHX408

## 2016-09-29 LAB — COMPREHENSIVE METABOLIC PANEL
ALT: 14 U/L (ref 14–54)
AST: 17 U/L (ref 15–41)
Albumin: 4 g/dL (ref 3.5–5.0)
Alkaline Phosphatase: 65 U/L (ref 38–126)
Anion gap: 7 (ref 5–15)
BUN: 12 mg/dL (ref 6–20)
CALCIUM: 9.7 mg/dL (ref 8.9–10.3)
CHLORIDE: 104 mmol/L (ref 101–111)
CO2: 29 mmol/L (ref 22–32)
Creatinine, Ser: 0.66 mg/dL (ref 0.44–1.00)
GFR calc Af Amer: >60 mL/min (ref >60)
GFR calc non Af Amer: >60 mL/min (ref >60)
Glucose, Bld: 94 mg/dL (ref 65–99)
Potassium: 3.5 mmol/L (ref 3.5–5.1)
Sodium: 140 mmol/L (ref 135–145)
Total Bilirubin: 0.4 mg/dL (ref 0.3–1.2)
Total Protein: 7 g/dL (ref 6.5–8.1)

## 2016-09-29 LAB — LIPASE, BLOOD: Lipase: 32 U/L (ref 11–51)

## 2016-09-29 LAB — CBC WITH DIFFERENTIAL/PLATELET
BASOS ABS: 0 10*3/uL (ref 0.0–0.1)
Basophils Relative: 0 %
EOS PCT: 3 %
Eosinophils Absolute: 0.1 10*3/uL (ref 0.0–0.7)
HEMATOCRIT: 32.4 % — AB (ref 36.0–46.0)
HEMOGLOBIN: 10.6 g/dL — AB (ref 12.0–15.0)
LYMPHS ABS: 1.5 10*3/uL (ref 0.7–4.0)
LYMPHS PCT: 40 %
MCH: 29.5 pg (ref 26.0–34.0)
MCHC: 32.7 g/dL (ref 30.0–36.0)
MCV: 90.3 fL (ref 78.0–100.0)
Monocytes Absolute: 0.2 10*3/uL (ref 0.1–1.0)
Monocytes Relative: 6 %
NEUTROS ABS: 1.9 10*3/uL (ref 1.7–7.7)
NEUTROS PCT: 51 %
PLATELETS: UNDETERMINED 10*3/uL (ref 150–400)
RBC: 3.59 MIL/uL — AB (ref 3.87–5.11)
RDW: 13.1 % (ref 11.5–15.5)
WBC: 3.7 10*3/uL — ABNORMAL LOW (ref 4.0–10.5)

## 2016-09-29 SURGERY — APPENDECTOMY, LAPAROSCOPIC
Anesthesia: General | Site: Abdomen

## 2016-09-29 MED ORDER — ONDANSETRON HCL 4 MG/2ML IJ SOLN
INTRAMUSCULAR | Status: AC
  Filled 2016-09-29: qty 2

## 2016-09-29 MED ORDER — SUCCINYLCHOLINE CHLORIDE 20 MG/ML IJ SOLN
INTRAMUSCULAR | Status: DC | PRN
  Administered 2016-09-29: 120 mg via INTRAVENOUS

## 2016-09-29 MED ORDER — PROPOFOL 10 MG/ML IV BOLUS
INTRAVENOUS | Status: AC
  Filled 2016-09-29: qty 20

## 2016-09-29 MED ORDER — DEXTROSE 5 % IV SOLN
2.0000 g | Freq: Once | INTRAVENOUS | Status: AC
  Administered 2016-09-29: 2 g via INTRAVENOUS
  Filled 2016-09-29: qty 2

## 2016-09-29 MED ORDER — BUPIVACAINE HCL (PF) 0.25 % IJ SOLN
INTRAMUSCULAR | Status: AC
  Filled 2016-09-29: qty 30

## 2016-09-29 MED ORDER — FENTANYL CITRATE (PF) 100 MCG/2ML IJ SOLN
25.0000 ug | INTRAMUSCULAR | Status: DC | PRN
  Administered 2016-09-29: 50 ug via INTRAVENOUS
  Filled 2016-09-29: qty 1

## 2016-09-29 MED ORDER — LIDOCAINE 2% (20 MG/ML) 5 ML SYRINGE
INTRAMUSCULAR | Status: AC
  Filled 2016-09-29: qty 5

## 2016-09-29 MED ORDER — ONDANSETRON HCL 4 MG/2ML IJ SOLN
4.0000 mg | Freq: Once | INTRAMUSCULAR | Status: DC
  Filled 2016-09-29: qty 2

## 2016-09-29 MED ORDER — ONDANSETRON HCL 4 MG/2ML IJ SOLN
4.0000 mg | Freq: Four times a day (QID) | INTRAMUSCULAR | Status: DC | PRN
  Administered 2016-09-30: 4 mg via INTRAVENOUS
  Filled 2016-09-29 (×2): qty 2

## 2016-09-29 MED ORDER — LIDOCAINE HCL (CARDIAC) 20 MG/ML IV SOLN
INTRAVENOUS | Status: DC | PRN
  Administered 2016-09-29: 100 mg via INTRATRACHEAL

## 2016-09-29 MED ORDER — KCL IN DEXTROSE-NACL 10-5-0.45 MEQ/L-%-% IV SOLN
INTRAVENOUS | Status: DC
  Administered 2016-09-30: via INTRAVENOUS
  Filled 2016-09-29 (×2): qty 1000

## 2016-09-29 MED ORDER — ZOLPIDEM TARTRATE 5 MG PO TABS: 5.0000 mg | ORAL_TABLET | Freq: Every evening | ORAL | Status: DC | PRN

## 2016-09-29 MED ORDER — HYDROMORPHONE HCL 2 MG/ML IJ SOLN
0.5000 mg | INTRAMUSCULAR | Status: DC | PRN
  Administered 2016-09-30 (×2): 1 mg via INTRAVENOUS
  Filled 2016-09-29 (×2): qty 1

## 2016-09-29 MED ORDER — SUGAMMADEX SODIUM 200 MG/2ML IV SOLN
INTRAVENOUS | Status: DC | PRN
  Administered 2016-09-29: 200 mg via INTRAVENOUS

## 2016-09-29 MED ORDER — DOCUSATE SODIUM 100 MG PO CAPS
100.0000 mg | ORAL_CAPSULE | Freq: Two times a day (BID) | ORAL | Status: DC
  Administered 2016-09-30 (×3): 100 mg via ORAL
  Filled 2016-09-29 (×3): qty 1

## 2016-09-29 MED ORDER — PROPOFOL 10 MG/ML IV BOLUS
INTRAVENOUS | Status: DC | PRN
  Administered 2016-09-29: 160 mg via INTRAVENOUS

## 2016-09-29 MED ORDER — SUFENTANIL CITRATE 50 MCG/ML IV SOLN
INTRAVENOUS | Status: DC | PRN
  Administered 2016-09-29 (×2): 10 ug via INTRAVENOUS
  Administered 2016-09-29: 15 ug via INTRAVENOUS

## 2016-09-29 MED ORDER — SUFENTANIL CITRATE 50 MCG/ML IV SOLN
INTRAVENOUS | Status: AC
  Filled 2016-09-29: qty 1

## 2016-09-29 MED ORDER — METRONIDAZOLE IN NACL 5-0.79 MG/ML-% IV SOLN
500.0000 mg | Freq: Once | INTRAVENOUS | Status: AC
  Administered 2016-09-29: 500 mg via INTRAVENOUS
  Filled 2016-09-29: qty 100

## 2016-09-29 MED ORDER — SODIUM CHLORIDE 0.9 % IR SOLN
Status: DC | PRN
  Administered 2016-09-29: 100 mL

## 2016-09-29 MED ORDER — ONDANSETRON 4 MG PO TBDP
4.0000 mg | ORAL_TABLET | Freq: Four times a day (QID) | ORAL | Status: DC | PRN
  Filled 2016-09-29: qty 1

## 2016-09-29 MED ORDER — MIDAZOLAM HCL 5 MG/5ML IJ SOLN
INTRAMUSCULAR | Status: DC | PRN
  Administered 2016-09-29: 2 mg via INTRAVENOUS

## 2016-09-29 MED ORDER — ROCURONIUM BROMIDE 10 MG/ML (PF) SYRINGE
PREFILLED_SYRINGE | INTRAVENOUS | Status: AC
  Filled 2016-09-29: qty 10

## 2016-09-29 MED ORDER — ONDANSETRON HCL 4 MG/2ML IJ SOLN: 4.0000 mg | INTRAMUSCULAR | Status: DC | PRN

## 2016-09-29 MED ORDER — ENOXAPARIN SODIUM 40 MG/0.4ML ~~LOC~~ SOLN
40.0000 mg | SUBCUTANEOUS | Status: DC
  Administered 2016-09-30: 40 mg via SUBCUTANEOUS
  Filled 2016-09-29: qty 0.4

## 2016-09-29 MED ORDER — SODIUM CHLORIDE 0.9 % IJ SOLN
INTRAMUSCULAR | Status: AC
  Filled 2016-09-29: qty 10

## 2016-09-29 MED ORDER — ONDANSETRON HCL 4 MG/2ML IJ SOLN
INTRAMUSCULAR | Status: DC | PRN
  Administered 2016-09-29: 4 mg via INTRAVENOUS

## 2016-09-29 MED ORDER — IOPAMIDOL (ISOVUE-300) INJECTION 61%
100.0000 mL | Freq: Once | INTRAVENOUS | Status: AC | PRN
Start: 2016-09-29 — End: 2016-09-29
  Administered 2016-09-29: 100 mL via INTRAVENOUS

## 2016-09-29 MED ORDER — ONDANSETRON HCL 4 MG/2ML IJ SOLN
4.0000 mg | Freq: Once | INTRAMUSCULAR | Status: DC | PRN
  Filled 2016-09-29: qty 2

## 2016-09-29 MED ORDER — FENTANYL CITRATE (PF) 100 MCG/2ML IJ SOLN: 50.0000 ug | INTRAMUSCULAR | Status: DC | PRN

## 2016-09-29 MED ORDER — SUCCINYLCHOLINE CHLORIDE 200 MG/10ML IV SOSY
PREFILLED_SYRINGE | INTRAVENOUS | Status: AC
Start: 2016-09-29 — End: 2016-09-29
  Filled 2016-09-29: qty 10

## 2016-09-29 MED ORDER — MIDAZOLAM HCL 2 MG/2ML IJ SOLN
INTRAMUSCULAR | Status: AC
  Filled 2016-09-29: qty 2

## 2016-09-29 MED ORDER — SUGAMMADEX SODIUM 200 MG/2ML IV SOLN
INTRAVENOUS | Status: AC
  Filled 2016-09-29: qty 2

## 2016-09-29 MED ORDER — 0.9 % SODIUM CHLORIDE (POUR BTL) OPTIME
TOPICAL | Status: DC | PRN
  Administered 2016-09-29: 1000 mL

## 2016-09-29 MED ORDER — ACETAMINOPHEN 500 MG PO TABS
1000.0000 mg | ORAL_TABLET | Freq: Four times a day (QID) | ORAL | Status: DC
  Administered 2016-09-30 – 2016-10-01 (×6): 1000 mg via ORAL
  Filled 2016-09-29 (×6): qty 2

## 2016-09-29 MED ORDER — HYDROMORPHONE HCL 2 MG/ML IJ SOLN
1.0000 mg | Freq: Once | INTRAMUSCULAR | Status: DC
  Filled 2016-09-29: qty 1

## 2016-09-29 MED ORDER — OXYCODONE HCL 5 MG PO TABS
5.0000 mg | ORAL_TABLET | ORAL | Status: DC | PRN
  Filled 2016-09-29: qty 2

## 2016-09-29 MED ORDER — LACTATED RINGERS IV SOLN
INTRAVENOUS | Status: DC | PRN
  Administered 2016-09-29 (×2): via INTRAVENOUS

## 2016-09-29 MED ORDER — BUPIVACAINE HCL (PF) 0.25 % IJ SOLN
INTRAMUSCULAR | Status: DC | PRN
  Administered 2016-09-29: 16 mL

## 2016-09-29 MED ORDER — DEXAMETHASONE SODIUM PHOSPHATE 10 MG/ML IJ SOLN
INTRAMUSCULAR | Status: AC
  Filled 2016-09-29: qty 1

## 2016-09-29 MED ORDER — ROCURONIUM BROMIDE 100 MG/10ML IV SOLN
INTRAVENOUS | Status: DC | PRN
  Administered 2016-09-29: 30 mg via INTRAVENOUS

## 2016-09-29 MED ORDER — FENTANYL CITRATE (PF) 100 MCG/2ML IJ SOLN
INTRAMUSCULAR | Status: AC
  Filled 2016-09-29: qty 2

## 2016-09-29 MED ORDER — DEXAMETHASONE SODIUM PHOSPHATE 10 MG/ML IJ SOLN
INTRAMUSCULAR | Status: DC | PRN
  Administered 2016-09-29: 10 mg via INTRAVENOUS

## 2016-09-29 MED ORDER — METHOCARBAMOL 500 MG PO TABS
500.0000 mg | ORAL_TABLET | Freq: Four times a day (QID) | ORAL | Status: DC | PRN
  Administered 2016-09-30 (×2): 500 mg via ORAL
  Filled 2016-09-29 (×3): qty 1

## 2016-09-29 SURGICAL SUPPLY — 41 items
ADH SKN CLS APL DERMABOND .7 (GAUZE/BANDAGES/DRESSINGS) ×1
APPLIER CLIP ROT 10 11.4 M/L (STAPLE)
APR CLP MED LRG 11.4X10 (STAPLE)
BAG SPEC RTRVL LRG 6X4 10 (ENDOMECHANICALS) ×1
CANISTER SUCTION 2500CC (MISCELLANEOUS) ×2 IMPLANT
CHLORAPREP W/TINT 26ML (MISCELLANEOUS) ×2 IMPLANT
CLIP APPLIE ROT 10 11.4 M/L (STAPLE) IMPLANT
CLSR STERI-STRIP ANTIMIC 1/2X4 (GAUZE/BANDAGES/DRESSINGS) ×1 IMPLANT
COVER SURGICAL LIGHT HANDLE (MISCELLANEOUS) ×2 IMPLANT
CUTTER FLEX LINEAR 45M (STAPLE) ×2 IMPLANT
DERMABOND ADVANCED (GAUZE/BANDAGES/DRESSINGS) ×1
DERMABOND ADVANCED .7 DNX12 (GAUZE/BANDAGES/DRESSINGS) ×1 IMPLANT
DRSG TEGADERM 2-3/8X2-3/4 SM (GAUZE/BANDAGES/DRESSINGS) ×6 IMPLANT
ELECT REM PT RETURN 9FT ADLT (ELECTROSURGICAL) ×2
ELECTRODE REM PT RTRN 9FT ADLT (ELECTROSURGICAL) ×1 IMPLANT
GLOVE BIOGEL PI IND STRL 8 (GLOVE) ×1 IMPLANT
GLOVE BIOGEL PI INDICATOR 8 (GLOVE) ×1
GLOVE ECLIPSE 7.5 STRL STRAW (GLOVE) ×2 IMPLANT
GOWN STRL REUS W/ TWL LRG LVL3 (GOWN DISPOSABLE) ×3 IMPLANT
GOWN STRL REUS W/TWL LRG LVL3 (GOWN DISPOSABLE) ×6
KIT BASIN OR (CUSTOM PROCEDURE TRAY) ×2 IMPLANT
KIT ROOM TURNOVER OR (KITS) ×2 IMPLANT
NS IRRIG 1000ML POUR BTL (IV SOLUTION) ×2 IMPLANT
PAD ARMBOARD 7.5X6 YLW CONV (MISCELLANEOUS) ×4 IMPLANT
POUCH SPECIMEN RETRIEVAL 10MM (ENDOMECHANICALS) ×2 IMPLANT
RELOAD 45 VASCULAR/THIN (ENDOMECHANICALS) ×2 IMPLANT
RELOAD STAPLE 45 3.5 BLU ETS (ENDOMECHANICALS) IMPLANT
RELOAD STAPLE TA45 3.5 REG BLU (ENDOMECHANICALS) ×2 IMPLANT
SCALPEL HARMONIC ACE (MISCELLANEOUS) ×2 IMPLANT
SET IRRIG TUBING LAPAROSCOPIC (IRRIGATION / IRRIGATOR) ×2 IMPLANT
SLEEVE ENDOPATH XCEL 5M (ENDOMECHANICALS) ×2 IMPLANT
SPECIMEN JAR SMALL (MISCELLANEOUS) ×2 IMPLANT
STRIP CLOSURE SKIN 1/2X4 (GAUZE/BANDAGES/DRESSINGS) ×2 IMPLANT
SUT MNCRL AB 4-0 PS2 18 (SUTURE) ×2 IMPLANT
TOWEL OR 17X24 6PK STRL BLUE (TOWEL DISPOSABLE) ×2 IMPLANT
TOWEL OR 17X26 10 PK STRL BLUE (TOWEL DISPOSABLE) ×2 IMPLANT
TRAY FOLEY CATH 16FR SILVER (SET/KITS/TRAYS/PACK) ×2 IMPLANT
TRAY LAPAROSCOPIC MC (CUSTOM PROCEDURE TRAY) ×2 IMPLANT
TROCAR XCEL BLUNT TIP 100MML (ENDOMECHANICALS) ×2 IMPLANT
TROCAR XCEL NON-BLD 5MMX100MML (ENDOMECHANICALS) ×2 IMPLANT
TUBING INSUFFLATION (TUBING) ×2 IMPLANT

## 2016-09-29 NOTE — ED Notes (Signed)
[place] should bring pt up w/o IV due to her stating that Anesthesia would go ahead and get IV started.

## 2016-09-29 NOTE — ED Triage Notes (Signed)
Pt sent here from [facility], having RLQ pain x 5-6 days. Denies n/v or fever. Went for ct scan today and sent here for +appendicitis. Last ate/drank at 1630.

## 2016-09-29 NOTE — Op Note (Signed)
OPERATIVE REPORT  DATE OF OPERATION: 09/29/2016  PATIENT:  Lum KeasWendy G Omlor  50 y.o. female  PRE-OPERATIVE DIAGNOSIS:  Acute appendicitis  POST-OPERATIVE DIAGNOSIS:  Abdominal adhesions with normal appendix  INDICATION(S) FOR OPERATION:  Abdominal pain and CT diagnosing acute appendicitis   FINDINGS:  Adhesions tethering the appendix to the retroperitoneum  PROCEDURE:  Procedure(s): APPENDECTOMY LAPAROSCOPIC  SURGEON:  Surgeon(s): Jimmye NormanJames Jerrion Tabbert, MD  ASSISTANLeta Speller: Nonoe  ANESTHESIA:   general  COMPLICATIONS:  None  EBL: 10 ml  BLOOD ADMINISTERED: none  DRAINS: none   SPECIMEN:  Source of Specimen:  Appendix  COUNTS CORRECT:  YES  PROCEDURE DETAILS: The patient was taken to the operating room and placed on the table in the supine position. After an adequate general endotracheal anesthetic was administered she was prepped and draped in usual sterile manner exposing her abdomen.  A proper timeout was performed identifying the patient and the procedure to be performed.  The supraumbilical midline incision was made using a #15 blade and taken down to the midline fascia. We grabbed the fascia with Kocher clamps and subsequently incised between the Kocher clamps. We bluntly dissected down to the perineal cavity then passed a pursestring suture of 0 Vicryl around the fascial opening. A Hassan cannula was passed into the peritoneal cavity and secured in place with the stitch. A right upper quadrant 5 mm cannula and a left lower quadrant 5 mm cannula pass under direct vision.  There was some abdominal adhesions just to the inferior aspect of the umbilicus on the anterior abdominal wall which were taken down using the harmonic scalpel. The patient was placed in Trendelenburg and the left side was tilted down. We mobilized the appendix and so at the cecum that it was tethered to the retroperitoneum in the right paracolic area by adhesions. These were taken down using the harmonic scalpel  mobilizing the base of the cecum and the appendix. We came across the mesoappendix using the harmonic scalpel freeing it up to the base of the cecum. We subsequently came across the base of the cecum using an Endo GIA blue cartridge stapler was detached completely. There was no evidence of inflammation of the appendix.  Terminal ileum was somewhat tethered to the appendix but did not appear to be acutely inflamed. There was some scar tissue in area but nothing that appeared to be acutely inflamed.  Once the appendix was detached was removed from the perineal cavity using an Endo Catch bag brought out from the umbilical site. Once this was done we inspected the area of the dissection on the to be minimal bleeding aspirated all fluid and gas in subsequently removed all cannulas.  The supraumbilical fascia site was closed using the stitch that was in place. We injected 0.25% Marcaine at all sites. The supraumbilical skin was closed using running subcuticular stitch of 4-0 Monocryl. Dermabond Steri-Strips and Tegaderms are used to complete our dressings. All needle counts, sponge counts, and his main counts were correct.  PATIENT DISPOSITION:  PACU - hemodynamically stable.   Luann Aspinwall 11/15/201710:45 PM

## 2016-09-29 NOTE — Transfer of Care (Signed)
Immediate Anesthesia Transfer of Care Note  Patient: Heather Hamilton  Procedure(s) Performed: Procedure(s): APPENDECTOMY LAPAROSCOPIC (N/A)  Patient Location: PACU  Anesthesia Type:General  Level of Consciousness: oriented, sedated, patient cooperative and responds to stimulation  Airway & Oxygen Therapy: Patient Spontanous Breathing and Patient connected to nasal cannula oxygen  Post-op Assessment: Report given to RN, Post -op Vital signs reviewed and stable and Patient moving all extremities X 4  Post vital signs: Reviewed and stable  Last Vitals:  Vitals:   09/29/16 1832 09/29/16 2250  BP: 124/87   Pulse: 68   Resp: 16   Temp: 36.3 C 36.6 C    Last Pain:  Vitals:   09/29/16 2250  TempSrc:   PainSc: 5          Complications: No apparent anesthesia complications

## 2016-09-29 NOTE — H&P (Signed)
Heather Hamilton is an 50 y.o. female.   Chief Complaint: Abdominal pain HPI: Pain started 2 days ago with nausea, no vomiting.  Pain is on the right side.  Decreased appetite.  Past Medical History:  Diagnosis Date  . Fall at home   . GERD (gastroesophageal reflux disease)   . Hypertension     Past Surgical History:  Procedure Laterality Date  . CESAREAN SECTION    . CHOLECYSTECTOMY N/A 07/09/2014   Procedure: LAPAROSCOPIC CHOLECYSTECTOMY;  Surgeon: Zenovia Jarred, MD;  Location: [place];  Service: General;  Laterality: N/A;  . MANDIBULAR HARDWARE REMOVAL N/A 05/13/2015   Procedure: MANDIBILE HARDWARE REMOVAL;  Surgeon: Melissa Montane, MD;  Location: [place];  Service: ENT;  Laterality: N/A;  . ORIF MANDIBULAR FRACTURE Bilateral 05/03/2015   Procedure: OPEN REDUCTION INTERNAL FIXATION (ORIF) MAXILLA FRACTURE ;  Surgeon: Melissa Montane, MD;  Location: [place];  Service: ENT;  Laterality: Bilateral;    History reviewed. No pertinent family history. Social History:  reports that she has never smoked. She has never used smokeless tobacco. She reports that she does not drink alcohol or use drugs.  Allergies:  Allergies  Allergen Reactions  . Codeine Nausea Only and Other (See Comments)    Makes very heated      (Not in a hospital admission)  Results for orders placed or performed during the hospital encounter of 09/29/16 (from the past 48 hour(s))  CBC with Differential     Status: Abnormal   Collection Time: 09/29/16  6:48 PM  Result Value Ref Range   WBC 3.7 (L) 4.0 - 10.5 K/uL   RBC 3.59 (L) 3.87 - 5.11 MIL/uL   Hemoglobin 10.6 (L) 12.0 - 15.0 g/dL   HCT 32.4 (L) 36.0 - 46.0 %   MCV 90.3 78.0 - 100.0 fL   MCH 29.5 26.0 - 34.0 pg   MCHC 32.7 30.0 - 36.0 g/dL   RDW 13.1 11.5 - 15.5 %   Platelets PLATELET CLUMPS NOTED ON SMEAR, UNABLE TO ESTIMATE 150 - 400 K/uL    Comment: FIBRIN STRANDS NOTED   Neutrophils Relative % 51 %   Neutro Abs 1.9 1.7 - 7.7 K/uL   Lymphocytes  Relative 40 %   Lymphs Abs 1.5 0.7 - 4.0 K/uL   Monocytes Relative 6 %   Monocytes Absolute 0.2 0.1 - 1.0 K/uL   Eosinophils Relative 3 %   Eosinophils Absolute 0.1 0.0 - 0.7 K/uL   Basophils Relative 0 %   Basophils Absolute 0.0 0.0 - 0.1 K/uL  Comprehensive metabolic panel     Status: None   Collection Time: 09/29/16  6:48 PM  Result Value Ref Range   Sodium 140 135 - 145 mmol/L   Potassium 3.5 3.5 - 5.1 mmol/L   Chloride 104 101 - 111 mmol/L   CO2 29 22 - 32 mmol/L   Glucose, Bld 94 65 - 99 mg/dL   BUN 12 6 - 20 mg/dL   Creatinine, Ser 0.66 0.44 - 1.00 mg/dL   Calcium 9.7 8.9 - 10.3 mg/dL   Total Protein 7.0 6.5 - 8.1 g/dL   Albumin 4.0 3.5 - 5.0 g/dL   AST 17 15 - 41 U/L   ALT 14 14 - 54 U/L   Alkaline Phosphatase 65 38 - 126 U/L   Total Bilirubin 0.4 0.3 - 1.2 mg/dL   GFR calc non Af Amer >60 >60 mL/min   GFR calc Af Amer >60 >60 mL/min  Comment: (NOTE) The eGFR has been calculated using the CKD EPI equation. This calculation has not been validated in all clinical situations. eGFR's persistently <60 mL/min signify possible Chronic Kidney Disease.    Anion gap 7 5 - 15  Lipase, blood     Status: None   Collection Time: 09/29/16  6:48 PM  Result Value Ref Range   Lipase 32 11 - 51 U/L   Ct Abdomen Pelvis W Contrast  Result Date: 09/29/2016 CLINICAL DATA:  Right lower quadrant pain for 5 days EXAM: CT ABDOMEN AND PELVIS WITH CONTRAST TECHNIQUE: Multidetector CT imaging of the abdomen and pelvis was performed using the standard protocol following bolus administration of intravenous contrast. CONTRAST:  172m ISOVUE-300 IOPAMIDOL (ISOVUE-300) INJECTION 61% COMPARISON:  CT abdomen pelvis 05/06/2015 FINDINGS: Lower chest: No pulmonary nodules. No visible pleural or pericardial effusion. Hepatobiliary: Normal hepatic size and contours without focal liver lesion. No perihepatic ascites. No intra- or extrahepatic biliary dilatation. Status post cholecystectomy. Pancreas: Normal  pancreatic contours and enhancement. No peripancreatic fluid collection or pancreatic ductal dilatation. Spleen: Normal. Adrenals/Urinary Tract: Normal adrenal glands. No hydronephrosis or solid renal mass. Stomach/Bowel: No abnormal bowel dilatation. No small bowel wall thickening or adjacent fat stranding to indicate acute inflammation. No abdominal fluid collection. There is a 7 mm appendicolith within the proximal aspect of the appendix. There is minimal appendiceal dilation with a mild degree of surrounding inflammation. No fluid collection or abscess. Vascular/Lymphatic: Normal course and caliber of the major abdominal vessels. No abdominal or pelvic adenopathy. Reproductive: Normal uterus and ovaries. No free fluid in the pelvis. Musculoskeletal: There is no bony spinal canal stenosis. No lytic or blastic lesions. Multilevel lumbar facet hypertrophy. Normal visualized extrathoracic and extraperitoneal soft tissues. Other: No contributory non-categorized findings. IMPRESSION: 1. 7 mm appendicolith within the proximal lumen of the appendix with minimal dilatation and mild surrounding inflammatory change. In the reported clinical context, the findings likely indicate early acute appendicitis. 2. No fluid collection, abscess or evidence of perforation. Electronically Signed   By: KUlyses JarredM.D.   On: 09/29/2016 15:55    Review of Systems  Constitutional: Negative for chills and fever.  Gastrointestinal: Positive for abdominal pain and nausea. Negative for vomiting.  All other systems reviewed and are negative.   Blood pressure 124/87, pulse 68, temperature 97.3 F (36.3 C), temperature source Oral, resp. rate 16, height '5\' 5"'  (1.651 m), weight 80.7 kg (178 lb), last menstrual period 06/16/2014, SpO2 98 %. Physical Exam  Nursing note and vitals reviewed. Constitutional: She is oriented to person, place, and time. She appears well-developed and well-nourished.  HENT:  Head: Normocephalic and  atraumatic.  Eyes: Conjunctivae and EOM are normal. Pupils are equal, round, and reactive to light.  Neck: Normal range of motion. Neck supple.  Cardiovascular: Normal rate, regular rhythm, normal heart sounds and intact distal pulses.   Respiratory: Effort normal and breath sounds normal.  GI: Soft. Bowel sounds are normal. She exhibits no distension. There is tenderness in the right lower quadrant. There is tenderness at McBurney's point. There is no rigidity, no rebound, no guarding and negative Murphy's sign.  Musculoskeletal: Normal range of motion.  Neurological: She is alert and oriented to person, place, and time.  Skin: Skin is warm and dry.  Psychiatric: She has a normal mood and affect. Her behavior is normal. Judgment and thought content normal.     Assessment/Plan Acute appendicitis clinically and radiologically with an appendolith and inflammation.  To OR for Lap appy  ASAP!  Judeth Horn, MD 09/29/2016, 9:02 PM

## 2016-09-29 NOTE — ED Provider Notes (Addendum)
[facility]-EMERGENCY DEPT Provider Note   CSN: [id number] Arrival date & time: 09/29/16  1719     History   Chief Complaint Chief Complaint  Patient presents with  . Abdominal Pain    HPI Lum KeasWendy G Tiffany is a 50 y.o. female.  50 year old female presents with five-day history of right lower quadrant pain characterized as waxing and waning but now more persistent. Pain characterized as sharp and worse with ambulation or movement. She has a medicating with Motrin and tramadol without relief. Denies any vaginal bleeding or discharge. No rashes noted. Has had nausea but no vomiting. No fever or chills. Denies any dysuria or hematuria. Saw her Dr. today he was sent for an outpatient CT scan which showed positive appendicitis.       Past Medical History:  Diagnosis Date  . Fall at home   . GERD (gastroesophageal reflux disease)   . Hypertension     Patient Active Problem List   Diagnosis Date Noted  . Acute blood loss anemia 05/05/2015  . Fall 05/02/2015  . [place] I fracture (HCC) 05/02/2015  . Cervical spinal stenosis 05/02/2015  . Syncope 05/02/2015  . Central cord syndrome (HCC) 05/02/2015  . Hypertension 05/02/2015  . Faintness     Past Surgical History:  Procedure Laterality Date  . CESAREAN SECTION    . CHOLECYSTECTOMY N/A 07/09/2014   Procedure: LAPAROSCOPIC CHOLECYSTECTOMY;  Surgeon: Liz MaladyBurke E Thompson, MD;  [facility];  Service: General;  Laterality: N/A;  . MANDIBULAR HARDWARE REMOVAL N/A 05/13/2015   Procedure: MANDIBILE HARDWARE REMOVAL;  Surgeon: Suzanna ObeyJohn Byers, MD;  Location: [facility];  Service: ENT;  Laterality: N/A;  . ORIF MANDIBULAR FRACTURE Bilateral 05/03/2015   Procedure: OPEN REDUCTION INTERNAL FIXATION (ORIF) MAXILLA FRACTURE ;  Surgeon: Suzanna ObeyJohn Byers, MD;  [facility];  Service: ENT;  Laterality: Bilateral;    OB History    No data available       Home Medications    Prior to Admission medications   Medication Sig Start Date End  Date Taking? Authorizing Provider  cephALEXin (KEFLEX) 250 MG/5ML suspension Take 5 mLs (250 mg total) by mouth every 6 (six) hours. 05/06/15   Freeman CaldronMichael J Jeffery, PA-C  HYDROcodone-acetaminophen (HYCET) 7.5-325 mg/15 ml solution Take 10-20 mLs by mouth every 4 (four) hours as needed (Pain). 05/06/15   Freeman CaldronMichael J Jeffery, PA-C  lisinopril (PRINIVIL,ZESTRIL) 20 MG tablet Take 20 mg by mouth daily.    Historical Provider, MD  pregabalin (LYRICA) 75 MG capsule Take 1 capsule (75 mg total) by mouth 2 (two) times daily. 05/06/15   Freeman CaldronMichael J Jeffery, PA-C    Family History History reviewed. No pertinent family history.  Social History Social History  Substance Use Topics  . Smoking status: Never Smoker  . Smokeless tobacco: Never Used  . Alcohol use No     Allergies   Codeine   Review of Systems Review of Systems  All other systems reviewed and are negative.    Physical Exam Updated Vital Signs BP 124/87 (BP Location: Left Arm)   Pulse 68   Temp 97.3 F (36.3 C) (Oral)   Resp 16   Ht 5\' 5"  (1.651 m)   LMP 06/16/2014   SpO2 98%   Physical Exam  Constitutional: She is oriented to person, place, and time. She appears well-developed and well-nourished.  Non-toxic appearance. No distress.  HENT:  Head: Normocephalic and atraumatic.  Eyes: Conjunctivae, EOM and lids are normal. Pupils are equal, round, and reactive to  light.  Neck: Normal range of motion. Neck supple. No tracheal deviation present. No thyroid mass present.  Cardiovascular: Normal rate, regular rhythm and normal heart sounds.  Exam reveals no gallop.   No murmur heard. Pulmonary/Chest: Effort normal and breath sounds normal. No stridor. No respiratory distress. She has no decreased breath sounds. She has no wheezes. She has no rhonchi. She has no rales.  Abdominal: Soft. Normal appearance and bowel sounds are normal. She exhibits no distension. There is tenderness in the right lower quadrant. There is guarding. There  is no rigidity, no rebound and no CVA tenderness.    Musculoskeletal: Normal range of motion. She exhibits no edema or tenderness.  Neurological: She is alert and oriented to person, place, and time. She has normal strength. No cranial nerve deficit or sensory deficit. GCS eye subscore is 4. GCS verbal subscore is 5. GCS motor subscore is 6.  Skin: Skin is warm and dry. No abrasion and no rash noted.  Psychiatric: She has a normal mood and affect. Her speech is normal and behavior is normal.  Nursing note and vitals reviewed.    ED Treatments / Results  Labs (all labs ordered are listed, but only abnormal results are displayed) Labs Reviewed  CBC WITH DIFFERENTIAL/PLATELET - Abnormal; Notable for the following:       Result Value   WBC 3.7 (*)    RBC 3.59 (*)    Hemoglobin 10.6 (*)    HCT 32.4 (*)    All other components within normal limits  COMPREHENSIVE METABOLIC PANEL  LIPASE, BLOOD  URINALYSIS, ROUTINE W REFLEX MICROSCOPIC (NOT AT [facility])    EKG  EKG Interpretation None       Radiology Ct Abdomen Pelvis W Contrast  Result Date: 09/29/2016 CLINICAL DATA:  Right lower quadrant pain for 5 days EXAM: CT ABDOMEN AND PELVIS WITH CONTRAST TECHNIQUE: Multidetector CT imaging of the abdomen and pelvis was performed using the standard protocol following bolus administration of intravenous contrast. CONTRAST:  100mL ISOVUE-300 IOPAMIDOL (ISOVUE-300) INJECTION 61% COMPARISON:  CT abdomen pelvis 05/06/2015 FINDINGS: Lower chest: No pulmonary nodules. No visible pleural or pericardial effusion. Hepatobiliary: Normal hepatic size and contours without focal liver lesion. No perihepatic ascites. No intra- or extrahepatic biliary dilatation. Status post cholecystectomy. Pancreas: Normal pancreatic contours and enhancement. No peripancreatic fluid collection or pancreatic ductal dilatation. Spleen: Normal. Adrenals/Urinary Tract: Normal adrenal glands. No hydronephrosis or solid renal mass.  Stomach/Bowel: No abnormal bowel dilatation. No small bowel wall thickening or adjacent fat stranding to indicate acute inflammation. No abdominal fluid collection. There is a 7 mm appendicolith within the proximal aspect of the appendix. There is minimal appendiceal dilation with a mild degree of surrounding inflammation. No fluid collection or abscess. Vascular/Lymphatic: Normal course and caliber of the major abdominal vessels. No abdominal or pelvic adenopathy. Reproductive: Normal uterus and ovaries. No free fluid in the pelvis. Musculoskeletal: There is no bony spinal canal stenosis. No lytic or blastic lesions. Multilevel lumbar facet hypertrophy. Normal visualized extrathoracic and extraperitoneal soft tissues. Other: No contributory non-categorized findings. IMPRESSION: 1. 7 mm appendicolith within the proximal lumen of the appendix with minimal dilatation and mild surrounding inflammatory change. In the reported clinical context, the findings likely indicate early acute appendicitis. 2. No fluid collection, abscess or evidence of perforation. Electronically Signed   By: Deatra RobinsonKevin  Herman M.D.   On: 09/29/2016 15:55    Procedures Procedures (including critical care time)  Medications Ordered in ED Medications  fentaNYL (SUBLIMAZE) injection 50 mcg (  not administered)  ondansetron (ZOFRAN) injection 4 mg (not administered)     Initial Impression / Assessment and Plan / ED Course  I have reviewed the triage vital signs and the nursing notes.  Pertinent labs & imaging results that were available during my care of the patient were reviewed by me and considered in my medical decision making (see chart for details).  Clinical Course   Patient medicated for pain here. Will start antibiotics as well. Spoke with Dr. Lindie Spruce who will come and see the patient    Final Clinical Impressions(s) / ED Diagnoses   Final diagnoses:  None    New Prescriptions New Prescriptions   No medications on file       Lorre Nick, MD 09/29/16 2016    Lorre Nick, MD 09/29/16 2018

## 2016-09-29 NOTE — ED Provider Notes (Signed)
MSE was initiated and I personally evaluated the patient and placed orders (if any) at  6:00 PM on September 29, 2016.  The patient appears stable so that the remainder of the MSE may be completed by another provider.  50 year old female presents with right lower quadrant pain and positive findings for appendicitis on CT scan from a few hours ago. NPO time is 4:30 PM. Labs were ordered as well as PRN pain and nausea medications, patient was placed on NPO status. She will need an acute care bed for surgical evaluation.   Lyndal Pulleyaniel Sharaya Boruff, MD 09/29/16 [phone]

## 2016-09-29 NOTE — Anesthesia Procedure Notes (Signed)
Procedure Name: Awake intubation Date/Time: 09/29/2016 9:42 PM Performed by: Melina SchoolsBANKS, Azad Calame J Pre-anesthesia Checklist: Patient identified, Emergency Drugs available, Suction available, Patient being monitored and Timeout performed Patient Re-evaluated:Patient Re-evaluated prior to inductionOxygen Delivery Method: Circle system utilized Intubation Type: IV induction, Rapid sequence and Cricoid Pressure applied Ventilation: Mask ventilation without difficulty Laryngoscope Size: Mac and 3 Grade View: Grade I Tube type: Oral Tube size: 8.0 mm Number of attempts: 1 Airway Equipment and Method: Stylet and Oral airway Placement Confirmation: ETT inserted through vocal cords under direct vision,  positive ETCO2 and breath sounds checked- equal and bilateral Secured at: 23 cm Tube secured with: Tape Dental Injury: Teeth and Oropharynx as per pre-operative assessment

## 2016-09-29 NOTE — ED Notes (Signed)
Spoke w/ Darlene from OR.

## 2016-09-29 NOTE — Anesthesia Preprocedure Evaluation (Signed)
Anesthesia Evaluation  Patient identified by MRN, date of birth, ID band Patient awake    Reviewed: Allergy & Precautions, NPO status , Patient's Chart, lab work & pertinent test results  Airway Mallampati: III  TM Distance: >3 FB Neck ROM: Full  Mouth opening: Limited Mouth Opening  Dental  (+) Teeth Intact, Dental Advisory Given,    Pulmonary    breath sounds clear to auscultation       Cardiovascular hypertension,  Rhythm:Regular Rate:Normal     Neuro/Psych    GI/Hepatic   Endo/Other    Renal/GU      Musculoskeletal   Abdominal   Peds  Hematology   Anesthesia Other Findings   Reproductive/Obstetrics                             Anesthesia Physical Anesthesia Plan  ASA: III  Anesthesia Plan: General   Post-op Pain Management:    Induction: Intravenous, Rapid sequence and Cricoid pressure planned  Airway Management Planned: Oral ETT  Additional Equipment:   Intra-op Plan:   Post-operative Plan: Extubation in OR  Informed Consent: I have reviewed the patients History and Physical, chart, labs and discussed the procedure including the risks, benefits and alternatives for the proposed anesthesia with the patient or authorized representative who has indicated his/her understanding and acceptance.   Dental advisory given  Plan Discussed with: CRNA and Anesthesiologist  Anesthesia Plan Comments:         Anesthesia Quick Evaluation

## 2016-09-30 ENCOUNTER — Encounter (HOSPITAL_COMMUNITY): Payer: Self-pay | Admitting: General Surgery

## 2016-09-30 LAB — URINALYSIS, ROUTINE W REFLEX MICROSCOPIC
Bilirubin Urine: NEGATIVE
GLUCOSE, UA: NEGATIVE mg/dL
Ketones, ur: NEGATIVE mg/dL
Nitrite: NEGATIVE
Protein, ur: NEGATIVE mg/dL
SPECIFIC GRAVITY, URINE: 1.026 (ref 1.005–1.030)
pH: 5.5 (ref 5.0–8.0)

## 2016-09-30 LAB — URINE MICROSCOPIC-ADD ON

## 2016-09-30 MED ORDER — KCL IN DEXTROSE-NACL 20-5-0.9 MEQ/L-%-% IV SOLN
INTRAVENOUS | Status: DC
Start: 2016-09-30 — End: 2016-10-01
  Administered 2016-09-30 – 2016-10-01 (×2): via INTRAVENOUS
  Filled 2016-09-30 (×2): qty 1000

## 2016-09-30 MED ORDER — IBUPROFEN 600 MG PO TABS: 600.0000 mg | ORAL_TABLET | Freq: Four times a day (QID) | ORAL | Status: DC | PRN

## 2016-09-30 MED ORDER — TRAMADOL HCL 50 MG PO TABS
50.0000 mg | ORAL_TABLET | Freq: Four times a day (QID) | ORAL | Status: DC | PRN
  Administered 2016-09-30: 50 mg via ORAL
  Filled 2016-09-30: qty 2

## 2016-09-30 MED ORDER — KETOROLAC TROMETHAMINE 30 MG/ML IJ SOLN
30.0000 mg | Freq: Four times a day (QID) | INTRAMUSCULAR | Status: DC | PRN
  Administered 2016-09-30: 30 mg via INTRAVENOUS
  Filled 2016-09-30: qty 1

## 2016-09-30 MED ORDER — INFLUENZA VAC SPLIT QUAD 0.5 ML IM SUSY: 0.5000 mL | PREFILLED_SYRINGE | INTRAMUSCULAR | Status: DC

## 2016-09-30 MED ORDER — ACETAMINOPHEN 325 MG PO TABS: 650.0000 mg | ORAL_TABLET | Freq: Four times a day (QID) | ORAL | Status: DC | PRN

## 2016-09-30 NOTE — Progress Notes (Signed)
1 Day Post-Op  Subjective: Having nausea earlier., Taking some clears now and planning on full first lunch. Port sites all look good  Objective: Vital signs in last 24 hours: Temp:  [97.3 F (36.3 C)-98.6 F (37 C)] 97.8 F (36.6 C) (11/16 1033) Pulse Rate:  [58-77] 58 (11/16 1033) Resp:  [10-16] 15 (11/15 2325) BP: (106-148)/(61-87) 148/70 (11/16 1033) SpO2:  [72 %-100 %] 97 % (11/16 1033) Weight:  [80.7 kg (178 lb)] 80.7 kg (178 lb) (11/15 2027) Last BM Date: 09/29/16 Afebrile, VSS No labs Intake/Output from previous day: 11/15 0701 - 11/16 0700 In: 1485 [I.V.:1485] Out: 405 [Urine:400; Blood:5] Intake/Output this shift: Total I/O In: -  Out: 300 [Urine:300]  General appearance: alert, cooperative and no distress GI: Port sites all look good abdomen soft and still tender from surgery last evening.  Lab Results:   Recent Labs  09/29/16 1848  WBC 3.7*  HGB 10.6*  HCT 32.4*  PLT PLATELET CLUMPS NOTED ON SMEAR, UNABLE TO ESTIMATE    BMET  Recent Labs  09/29/16 1848  NA 140  K 3.5  CL 104  CO2 29  GLUCOSE 94  BUN 12  CREATININE 0.66  CALCIUM 9.7   PT/INR No results for input(s): LABPROT, INR in the last 72 hours.   Recent Labs Lab 09/29/16 1848  AST 17  ALT 14  ALKPHOS 65  BILITOT 0.4  PROT 7.0  ALBUMIN 4.0     Lipase     Component Value Date/Time   LIPASE 32 09/29/2016 1848     Studies/Results: Ct Abdomen Pelvis W Contrast  Result Date: 09/29/2016 CLINICAL DATA:  Right lower quadrant pain for 5 days EXAM: CT ABDOMEN AND PELVIS WITH CONTRAST TECHNIQUE: Multidetector CT imaging of the abdomen and pelvis was performed using the standard protocol following bolus administration of intravenous contrast. CONTRAST:  100mL ISOVUE-300 IOPAMIDOL (ISOVUE-300) INJECTION 61% COMPARISON:  CT abdomen pelvis 05/06/2015 FINDINGS: Lower chest: No pulmonary nodules. No visible pleural or pericardial effusion. Hepatobiliary: Normal hepatic size and contours  without focal liver lesion. No perihepatic ascites. No intra- or extrahepatic biliary dilatation. Status post cholecystectomy. Pancreas: Normal pancreatic contours and enhancement. No peripancreatic fluid collection or pancreatic ductal dilatation. Spleen: Normal. Adrenals/Urinary Tract: Normal adrenal glands. No hydronephrosis or solid renal mass. Stomach/Bowel: No abnormal bowel dilatation. No small bowel wall thickening or adjacent fat stranding to indicate acute inflammation. No abdominal fluid collection. There is a 7 mm appendicolith within the proximal aspect of the appendix. There is minimal appendiceal dilation with a mild degree of surrounding inflammation. No fluid collection or abscess. Vascular/Lymphatic: Normal course and caliber of the major abdominal vessels. No abdominal or pelvic adenopathy. Reproductive: Normal uterus and ovaries. No free fluid in the pelvis. Musculoskeletal: There is no bony spinal canal stenosis. No lytic or blastic lesions. Multilevel lumbar facet hypertrophy. Normal visualized extrathoracic and extraperitoneal soft tissues. Other: No contributory non-categorized findings. IMPRESSION: 1. 7 mm appendicolith within the proximal lumen of the appendix with minimal dilatation and mild surrounding inflammatory change. In the reported clinical context, the findings likely indicate early acute appendicitis. 2. No fluid collection, abscess or evidence of perforation. Electronically Signed   By: Deatra RobinsonKevin  Herman M.D.   On: 09/29/2016 15:55   Prior to Admission medications   Medication Sig Start Date End Date Taking? Authorizing Provider  ibuprofen (ADVIL,MOTRIN) 200 MG tablet Take 400 mg by mouth every 6 (six) hours as needed.   Yes Historical Provider, MD  lisinopril (PRINIVIL,ZESTRIL) 20 MG tablet Take  20 mg by mouth daily.   Yes Historical Provider, MD  traMADol (ULTRAM) 50 MG tablet Take 50 mg by mouth every 6 (six) hours as needed.   Yes Historical Provider, MD  cephALEXin  (KEFLEX) 250 MG/5ML suspension Take 5 mLs (250 mg total) by mouth every 6 (six) hours. Patient not taking: Reported on 09/29/2016 05/06/15   Freeman CaldronMichael J Jeffery, PA-C  HYDROcodone-acetaminophen (HYCET) 7.5-325 mg/15 ml solution Take 10-20 mLs by mouth every 4 (four) hours as needed (Pain). Patient not taking: Reported on 09/29/2016 05/06/15   Freeman CaldronMichael J Jeffery, PA-C  pregabalin (LYRICA) 75 MG capsule Take 1 capsule (75 mg total) by mouth 2 (two) times daily. Patient not taking: Reported on 09/29/2016 05/06/15   Freeman CaldronMichael J Jeffery, PA-C    Medications: . acetaminophen  1,000 mg Oral Q6H  . docusate sodium  100 mg Oral BID  . enoxaparin (LOVENOX) injection  40 mg Subcutaneous Q24H  . fentaNYL      . [START ON 10/01/2016] Influenza vac split quadrivalent PF  0.5 mL Intramuscular Tomorrow-1000   . dextrose 5 % and 0.45 % NaCl with KCl 10 mEq/L 100 mL/hr at 09/30/16 0016    Assessment/Plan Abdominal adhesions with normal appendix S/p laparoscopic appendectomy with Findings: Adhesions tethering appendix to retroperitoneum. 09/29/16 Dr. Jimmye NormanJames Wyatt Hospitalized after fall 6/17-6/21/17 FEN:  Soft diet ID:  preop DVT:  Lovenox   Plan: Pain control work on her nausea. Home later when she is tolerating by mouth's well.     LOS: 0 days    Heather Hamilton 09/30/2016 [phone]

## 2016-09-30 NOTE — Progress Notes (Signed)
Continued nausea with liquids, trying some full liquids.  Will continue IV fluids and hydrate and give her more time to recover. Off narcotics, Toradol, tylenol and tramadol for pain.  Add pepcid.  Restart lisinopril when she is feeling better.  Labs in AM.

## 2016-09-30 NOTE — Anesthesia Postprocedure Evaluation (Signed)
Anesthesia Post Note  Patient: Heather Hamilton  Procedure(s) Performed: Procedure(s) (LRB): APPENDECTOMY LAPAROSCOPIC (N/A)  Patient location during evaluation: PACU Anesthesia Type: General Level of consciousness: awake, awake and alert and oriented Pain management: pain level controlled Vital Signs Assessment: post-procedure vital signs reviewed and stable Respiratory status: spontaneous breathing, nonlabored ventilation and respiratory function stable Cardiovascular status: blood pressure returned to baseline Postop Assessment: no headache Anesthetic complications: no    Last Vitals:  Vitals:   09/29/16 2325 09/29/16 2346  BP: 116/73 122/68  Pulse: 62 (!) 58  Resp: 15   Temp:  37 C    Last Pain:  Vitals:   09/30/16 0013  TempSrc:   PainSc: 7                  Tammi Boulier COKER

## 2016-09-30 NOTE — Discharge Instructions (Signed)
[facility] ______[facility] °LAPAROSCOPIC SURGERY: POST OP INSTRUCTIONS °Always review your discharge instruction sheet given to you by the facility where your surgery was performed. °IF YOU HAVE DISABILITY OR FAMILY LEAVE FORMS, YOU MUST BRING THEM TO THE OFFICE FOR PROCESSING.   °DO NOT GIVE THEM TO YOUR DOCTOR. ° °1. A prescription for pain medication may be given to you upon discharge.  Take your pain medication as prescribed, if needed.  If narcotic pain medicine is not needed, then you may take acetaminophen (Tylenol) or ibuprofen (Advil) as needed. °2. Take your usually prescribed medications unless otherwise directed. °3. If you need a refill on your pain medication, please contact your pharmacy.  They will contact our office to request authorization. Prescriptions will not be filled after 5pm or on week-ends. °4. You should follow a light diet the first few days after arrival home, such as soup and crackers, etc.  Be sure to include lots of fluids daily. °5. Most patients will experience some swelling and bruising in the area of the incisions.  Ice packs will help.  Swelling and bruising can take several days to resolve.  °6. It is common to experience some constipation if taking pain medication after surgery.  Increasing fluid intake and taking a stool softener (such as Colace) will usually help or prevent this problem from occurring.  A mild laxative (Milk of Magnesia or Miralax) should be taken according to package instructions if there are no bowel movements after 48 hours. °7. Unless discharge instructions indicate otherwise, you may remove your bandages 24-48 hours after surgery, and you may shower at that time.  You may have steri-strips (small skin tapes) in place directly over the incision.  These strips should be left on the skin for 7-10 days.  If your surgeon used skin glue on the incision, you may shower in 24 hours.  The glue will flake off over the next 2-3 weeks.  Any sutures or  staples will be removed at the office during your follow-up visit. °8. ACTIVITIES:  You may resume regular (light) daily activities beginning the next day--such as daily self-care, walking, climbing stairs--gradually increasing activities as tolerated.  You may have sexual intercourse when it is comfortable.  Refrain from any heavy lifting or straining until approved by your doctor. °a. You may drive when you are no longer taking prescription pain medication, you can comfortably wear a seatbelt, and you can safely maneuver your car and apply brakes. °b. RETURN TO WORK:  __________________________________________________________ °9. You should see your doctor in the office for a follow-up appointment approximately 2-3 weeks after your surgery.  Make sure that you call for this appointment within a day or two after you arrive home to insure a convenient appointment time. °10. OTHER INSTRUCTIONS: __________________________________________________________________________________________________________________________ __________________________________________________________________________________________________________________________ °WHEN TO CALL YOUR DOCTOR: °1. Fever over 101.0 °2. Inability to urinate °3. Continued bleeding from incision. °4. Increased pain, redness, or drainage from the incision. °5. Increasing abdominal pain ° °The clinic staff is available to answer your questions during regular business hours.  Please don’t hesitate to call and ask to speak to one of the nurses for clinical concerns.  If you have a medical emergency, go to the nearest emergency room or call 911.  A surgeon from [facility] is always on call at the hospital. °[address], [address], [place], [place]  [address] ? [address] [address], [place], [place]   [address] °[phone] ? [phone] ? FAX [phone] °Web site:   [email or web address] ° ° °Laparoscopic Appendectomy, Adult, Care After °Refer to  this sheet in the next few weeks. These instructions provide you with information about caring for yourself after your procedure. Your health care provider may also give you more specific instructions. Your treatment has been planned according to current medical practices, but problems sometimes occur. Call your health care provider if you have any problems or questions after your procedure. °What can I expect after the procedure? °After the procedure, it is common to have: °· A decrease in your energy level. °· Mild pain in the area where the surgical cuts (incisions) were made. °· Constipation. This can be caused by pain medicine and a decrease in your activity. °Follow these instructions at home: °Medicines  °· Take over-the-counter and prescription medicines only as told by your health care provider. °· Do not drive for 24 hours if you received a sedative. °· Do not drive or operate heavy machinery while taking prescription pain medicine. °· If you were prescribed an antibiotic medicine, take it as told by your health care provider. Do not stop taking the antibiotic even if you start to feel better. °Activity  °· For 3 weeks or as long as told by your health care provider: °¨ Do not lift anything that is heavier than 10 pounds (4.5 kg). °¨ Do not play contact sports. °· Gradually return to your normal activities. Ask your health care provider what activities are safe for you. °Bathing  °· Keep your incisions clean and dry. Clean them as often as told by your health care provider: °¨ Gently wash the incisions with soap and water. °¨ Rinse the incisions with water to remove all soap. °¨ Pat the incisions dry with a clean towel. Do not rub the incisions. °· You may take showers after 48 hours. °· Do not take baths, swim, or use hot tubs for 2 weeks or as told by your health care provider. °Incision care  °· Follow instructions from your healthcare provider about how to take care of your incisions. Make sure  you: °¨ Wash your hands with soap and water before you change your bandage (dressing). If soap and water are not available, use hand sanitizer. °¨ Change your dressing as told by your health care provider. °¨ Leave stitches (sutures), skin glue, or adhesive strips in place. These skin closures may need to stay in place for 2 weeks or longer. If adhesive strip edges start to loosen and curl up, you may trim the loose edges. Do not remove adhesive strips completely unless your health care provider tells you to do that. °· Check your incision areas every day for signs of infection. Check for: °¨ More redness, swelling, or pain. °¨ More fluid or blood. °¨ Warmth. °¨ Pus or a bad smell. °Other Instructions  °· If you were sent home with a drain, follow instructions from your health care provider about how to care for the drain and how to empty it. °· Take deep breaths. This helps to prevent your lungs from becoming inflamed. °· To relieve and prevent constipation: °¨ Drink plenty of fluids. °¨ Eat plenty of fruits and vegetables. °· Keep all follow-up visits as told by your health care provider. This is important. °Contact a health care provider if: °· You have more redness, swelling, or pain around an incision. °· You have more fluid or blood coming from an incision. °· Your incision feels warm to the touch. °· You have pus or a bad smell coming   from an incision or dressing. °· Your incision edges break open after your sutures have been removed. °· You have increasing pain in your shoulders. °· You feel dizzy or you faint. °· You develop shortness of breath. °· You keep feeling nauseous or vomiting. °· You have diarrhea or you cannot control your bowel functions. °· You lose your appetite. °· You develop swelling or pain in your legs. °Get help right away if: °· You have a fever. °· You develop a rash. °· You have difficulty breathing. °· You have sharp pains in your chest. °This information is not intended to replace  advice given to you by your health care provider. Make sure you discuss any questions you have with your health care provider. °Document Released: 11/01/2005 Document Revised: 04/02/2016 Document Reviewed: 04/21/2015 °Elsevier Interactive Patient Education © 2017 [employer] ° °

## 2016-10-01 ENCOUNTER — Encounter: Payer: Self-pay | Admitting: General Surgery

## 2016-10-01 LAB — CBC
HCT: 31.9 % — ABNORMAL LOW (ref 36.0–46.0)
Hemoglobin: 10.3 g/dL — ABNORMAL LOW (ref 12.0–15.0)
MCH: 29.3 pg (ref 26.0–34.0)
MCHC: 32.3 g/dL (ref 30.0–36.0)
MCV: 90.6 fL (ref 78.0–100.0)
PLATELETS: 144 10*3/uL — AB (ref 150–400)
RBC: 3.52 MIL/uL — ABNORMAL LOW (ref 3.87–5.11)
RDW: 13.1 % (ref 11.5–15.5)
WBC: 6.9 10*3/uL (ref 4.0–10.5)

## 2016-10-01 LAB — BASIC METABOLIC PANEL
Anion gap: 5 (ref 5–15)
BUN: 9 mg/dL (ref 6–20)
CALCIUM: 8.6 mg/dL — AB (ref 8.9–10.3)
CO2: 26 mmol/L (ref 22–32)
Chloride: 110 mmol/L (ref 101–111)
Creatinine, Ser: 0.74 mg/dL (ref 0.44–1.00)
GFR calc Af Amer: >60 mL/min (ref >60)
GLUCOSE: 127 mg/dL — AB (ref 65–99)
Potassium: 4.1 mmol/L (ref 3.5–5.1)
Sodium: 141 mmol/L (ref 135–145)

## 2016-10-01 MED ORDER — ACETAMINOPHEN 325 MG PO TABS: 650.0000 mg | ORAL_TABLET | Freq: Four times a day (QID) | ORAL | Status: AC | PRN

## 2016-10-01 MED ORDER — TRAMADOL HCL 50 MG PO TABS: 50.0000 mg | ORAL_TABLET | Freq: Four times a day (QID) | ORAL | 0 refills | Status: DC | PRN

## 2016-10-01 NOTE — Progress Notes (Signed)
2 Days Post-Op  Subjective: She feels good this Am, tolerating diet and ready to go home.  Objective: Vital signs in last 24 hours: Temp:  [97.4 F (36.3 C)-98.2 F (36.8 C)] 97.8 F (36.6 C) (11/17 0631) Pulse Rate:  [58-65] 65 (11/17 0631) Resp:  [16] 16 (11/17 0631) BP: (126-148)/(63-70) 131/68 (11/17 0631) SpO2:  [97 %-99 %] 97 % (11/17 0631) Last BM Date: 09/29/16 480 PO 700 urine Afebrile, VSS Labs all stable. Intake/Output from previous day: 11/16 0701 - 11/17 0700 In: 2528.3 [P.O.:480; I.V.:2048.3] Out: 700 [Urine:700] Intake/Output this shift: No intake/output data recorded.  General appearance: alert, cooperative and no distress Resp: clear to auscultation bilaterally GI: soft, sore, site are fine.    Lab Results:   Recent Labs  09/29/16 1848 10/01/16 0428  WBC 3.7* 6.9  HGB 10.6* 10.3*  HCT 32.4* 31.9*  PLT PLATELET CLUMPS NOTED ON SMEAR, UNABLE TO ESTIMATE 144*    BMET  Recent Labs  09/29/16 1848 10/01/16 0428  NA 140 141  K 3.5 4.1  CL 104 110  CO2 29 26  GLUCOSE 94 127*  BUN 12 9  CREATININE 0.66 0.74  CALCIUM 9.7 8.6*   PT/INR No results for input(s): LABPROT, INR in the last 72 hours.   Recent Labs Lab 09/29/16 1848  AST 17  ALT 14  ALKPHOS 65  BILITOT 0.4  PROT 7.0  ALBUMIN 4.0     Lipase     Component Value Date/Time   LIPASE 32 09/29/2016 1848     Studies/Results: Ct Abdomen Pelvis W Contrast  Result Date: 09/29/2016 CLINICAL DATA:  Right lower quadrant pain for 5 days EXAM: CT ABDOMEN AND PELVIS WITH CONTRAST TECHNIQUE: Multidetector CT imaging of the abdomen and pelvis was performed using the standard protocol following bolus administration of intravenous contrast. CONTRAST:  100mL ISOVUE-300 IOPAMIDOL (ISOVUE-300) INJECTION 61% COMPARISON:  CT abdomen pelvis 05/06/2015 FINDINGS: Lower chest: No pulmonary nodules. No visible pleural or pericardial effusion. Hepatobiliary: Normal hepatic size and contours without  focal liver lesion. No perihepatic ascites. No intra- or extrahepatic biliary dilatation. Status post cholecystectomy. Pancreas: Normal pancreatic contours and enhancement. No peripancreatic fluid collection or pancreatic ductal dilatation. Spleen: Normal. Adrenals/Urinary Tract: Normal adrenal glands. No hydronephrosis or solid renal mass. Stomach/Bowel: No abnormal bowel dilatation. No small bowel wall thickening or adjacent fat stranding to indicate acute inflammation. No abdominal fluid collection. There is a 7 mm appendicolith within the proximal aspect of the appendix. There is minimal appendiceal dilation with a mild degree of surrounding inflammation. No fluid collection or abscess. Vascular/Lymphatic: Normal course and caliber of the major abdominal vessels. No abdominal or pelvic adenopathy. Reproductive: Normal uterus and ovaries. No free fluid in the pelvis. Musculoskeletal: There is no bony spinal canal stenosis. No lytic or blastic lesions. Multilevel lumbar facet hypertrophy. Normal visualized extrathoracic and extraperitoneal soft tissues. Other: No contributory non-categorized findings. IMPRESSION: 1. 7 mm appendicolith within the proximal lumen of the appendix with minimal dilatation and mild surrounding inflammatory change. In the reported clinical context, the findings likely indicate early acute appendicitis. 2. No fluid collection, abscess or evidence of perforation. Electronically Signed   By: Deatra RobinsonKevin  Herman M.D.   On: 09/29/2016 15:55    Medications: . acetaminophen  1,000 mg Oral Q6H  . docusate sodium  100 mg Oral BID  . enoxaparin (LOVENOX) injection  40 mg Subcutaneous Q24H  . Influenza vac split quadrivalent PF  0.5 mL Intramuscular Tomorrow-1000    Assessment/Plan Abdominal adhesions with normal  appendix - path still pending S/p laparoscopic appendectomy with Findings: Adhesions tethering appendix to retroperitoneum. 09/29/16 Dr. Jimmye NormanJames Wyatt Hospitalized after fall  6/17-6/21/17 FEN:  Soft diet ID:  preop DVT:  Lovenox   Plan:  Home today.   LOS: 0 days    Kayle Passarelli 10/01/2016 [phone]

## 2016-10-01 NOTE — Progress Notes (Signed)
[name] to be D/C'd  per MD order. Discussed with the patient and all questions fully answered.  VSS, Skin clean, dry and intact without evidence of skin break down, no evidence of skin tears noted.  IV catheter discontinued intact. Site without signs and symptoms of complications. Dressing and pressure applied.  An After Visit Summary was printed and given to the patient. Patient received prescription.  D/c education completed with patient/family including follow up instructions, medication list, d/c activities limitations if indicated, with other d/c instructions as indicated by MD - patient able to verbalize understanding, all questions fully answered.   Patient instructed to return to ED, call 911, or call MD for any changes in condition.   Patient to be escorted via WC, and D/C home via private auto.

## 2016-10-02 NOTE — Discharge Summary (Signed)
Physician Discharge Summary  Patient ID: [name] MRN: [id number] DOB/AGE: 52/05/1966 52 y.o.  Admit date: 09/29/2016 Discharge date: 10/01/2016  Admission Diagnoses:  Acute appendicitis  Discharge Diagnoses:  Abdominal adhesions with normal appendix  Active Problems:   Acute appendicitis   PROCEDURES:  Laparoscopic appendectomy 09/29/16, Dr. Vergia AlbertsJames Wyatt  Hospital Course:  Chief Complaint: Abdominal pain Pain started 2 days ago with nausea, no vomiting.  Pain is on the right side.  Decreased appetite. On CT scan it appeared that she had acute appendicitis.  She was admitted and taken to the OR. At surgery she had: Adhesions tethering the appendix to the retroperitoneum.  She underwent the procedure and returned to the floor in satisfactory condition. Post op she had some issues with nausea, these improved and she was discharged the second post op morning.    Pathology is still pending.  CBC Latest Ref Rng & Units 10/01/2016 09/29/2016 05/06/2015  WBC 4.0 - 10.5 K/uL 6.9 3.7(L) 3.5(L)  Hemoglobin 12.0 - 15.0 g/dL 10.3(L) 10.6(L) 9.7(L)  Hematocrit 36.0 - 46.0 % 31.9(L) 32.4(L) 29.6(L)  Platelets 150 - 400 K/uL 144(L) PLATELET CLUMPS NOTED ON SMEAR, UNABLE TO ESTIMATE 146(L)   CMP Latest Ref Rng & Units 10/01/2016 09/29/2016 05/03/2015  Glucose 65 - 99 mg/dL 409(W127(H) 94 90  BUN 6 - 20 mg/dL 9 12 10   Creatinine 0.44 - 1.00 mg/dL 1.190.74 1.470.66 8.290.72  Sodium 135 - 145 mmol/L 141 140 136  Potassium 3.5 - 5.1 mmol/L 4.1 3.5 3.8  Chloride 101 - 111 mmol/L 110 104 105  CO2 22 - 32 mmol/L 26 29 19(L)  Calcium 8.9 - 10.3 mg/dL 5.6(O8.6(L) 9.7 1.3(Y8.7(L)  Total Protein 6.5 - 8.1 g/dL - 7.0 -  Total Bilirubin 0.3 - 1.2 mg/dL - 0.4 -  Alkaline Phos 38 - 126 U/L - 65 -  AST 15 - 41 U/L - 17 -  ALT 14 - 54 U/L - 14 -    Condition on D/C:  Improved   Disposition: 01-Home or Self Care     Medication List    STOP taking these medications   cephALEXin 250 MG/5ML suspension Commonly known as:   KEFLEX   HYDROcodone-acetaminophen 7.5-325 mg/15 ml solution Commonly known as:  HYCET   pregabalin 75 MG capsule Commonly known as:  LYRICA     TAKE these medications   acetaminophen 325 MG tablet Commonly known as:  TYLENOL Take 2 tablets (650 mg total) by mouth every 6 (six) hours as needed for mild pain, fever or headache.   ibuprofen 200 MG tablet Commonly known as:  ADVIL,MOTRIN Take 400 mg by mouth every 6 (six) hours as needed.   lisinopril 20 MG tablet Commonly known as:  PRINIVIL,ZESTRIL Take 20 mg by mouth daily.   traMADol 50 MG tablet Commonly known as:  ULTRAM Take 1-2 tablets (50-100 mg total) by mouth every 6 (six) hours as needed for moderate pain. What changed:  how much to take  reasons to take this      Follow-up Information    [facility] Follow up on 10/11/2016.   Specialty:  General Surgery Why:  Your appointment is on 10/11/16 at 3 PM. Be at the office 30 minutes early for check-in, bring photo ID and insurance information. Contact information: [address] [place] [id number] [phone]           Signed: Sherrie GeorgeJENNINGS,Masayuki Sakai 10/02/2016, 11:19 AM

## 2016-11-03 ENCOUNTER — Other Ambulatory Visit: Payer: Self-pay | Admitting: Physician Assistant

## 2016-11-03 DIAGNOSIS — Z1231 Encounter for screening mammogram for malignant neoplasm of breast: Secondary | ICD-10-CM

## 2016-11-24 ENCOUNTER — Ambulatory Visit: Payer: 59

## 2017-11-11 ENCOUNTER — Other Ambulatory Visit: Payer: Self-pay | Admitting: Physician Assistant

## 2017-11-11 DIAGNOSIS — N6310 Unspecified lump in the right breast, unspecified quadrant: Secondary | ICD-10-CM

## 2017-11-16 ENCOUNTER — Other Ambulatory Visit: Payer: Self-pay | Admitting: Physician Assistant

## 2017-11-16 DIAGNOSIS — N63 Unspecified lump in unspecified breast: Secondary | ICD-10-CM

## 2017-11-18 ENCOUNTER — Other Ambulatory Visit: Payer: 59

## 2017-11-23 ENCOUNTER — Ambulatory Visit: Payer: 59

## 2017-11-23 ENCOUNTER — Ambulatory Visit
Admission: RE | Admit: 2017-11-23 | Discharge: 2017-11-23 | Disposition: A | Discharge status: Home / Self Care | Payer: 59 | Source: Ambulatory Visit | Attending: Physician Assistant | Admitting: Physician Assistant

## 2017-11-23 ENCOUNTER — Ambulatory Visit
Admission: RE | Admit: 2017-11-23 | Discharge: 2017-11-23 | Disposition: A | Discharge status: Home / Self Care | Payer: BLUE CROSS/BLUE SHIELD | Source: Ambulatory Visit | Attending: Physician Assistant | Admitting: Physician Assistant

## 2017-11-23 ENCOUNTER — Other Ambulatory Visit: Payer: Self-pay | Admitting: Physician Assistant

## 2017-11-23 DIAGNOSIS — N6310 Unspecified lump in the right breast, unspecified quadrant: Secondary | ICD-10-CM | POA: Diagnosis not present

## 2017-11-23 DIAGNOSIS — R928 Other abnormal and inconclusive findings on diagnostic imaging of breast: Secondary | ICD-10-CM | POA: Diagnosis not present

## 2017-11-23 DIAGNOSIS — N63 Unspecified lump in unspecified breast: Secondary | ICD-10-CM

## 2017-12-05 ENCOUNTER — Ambulatory Visit: Payer: 59

## 2017-12-22 ENCOUNTER — Other Ambulatory Visit: Payer: Self-pay | Admitting: Physician Assistant

## 2017-12-22 ENCOUNTER — Ambulatory Visit
Admission: RE | Admit: 2017-12-22 | Discharge: 2017-12-22 | Disposition: A | Discharge status: Home / Self Care | Payer: BLUE CROSS/BLUE SHIELD | Source: Ambulatory Visit | Attending: Physician Assistant | Admitting: Physician Assistant

## 2017-12-22 DIAGNOSIS — R509 Fever, unspecified: Secondary | ICD-10-CM

## 2017-12-22 DIAGNOSIS — J069 Acute upper respiratory infection, unspecified: Secondary | ICD-10-CM | POA: Diagnosis not present

## 2017-12-22 DIAGNOSIS — R05 Cough: Secondary | ICD-10-CM

## 2017-12-22 DIAGNOSIS — R059 Cough, unspecified: Secondary | ICD-10-CM

## 2018-06-15 DIAGNOSIS — N3000 Acute cystitis without hematuria: Secondary | ICD-10-CM | POA: Diagnosis not present

## 2018-06-15 DIAGNOSIS — R55 Syncope and collapse: Secondary | ICD-10-CM | POA: Diagnosis not present

## 2018-09-03 ENCOUNTER — Emergency Department (HOSPITAL_COMMUNITY)
Admission: EM | Admit: 2018-09-03 | Discharge: 2018-09-03 | Disposition: A | Discharge status: Home / Self Care | Payer: BLUE CROSS/BLUE SHIELD | Attending: Emergency Medicine | Admitting: Emergency Medicine

## 2018-09-03 ENCOUNTER — Emergency Department (HOSPITAL_COMMUNITY): Payer: BLUE CROSS/BLUE SHIELD

## 2018-09-03 ENCOUNTER — Encounter (HOSPITAL_COMMUNITY): Payer: Self-pay | Admitting: Emergency Medicine

## 2018-09-03 ENCOUNTER — Other Ambulatory Visit: Payer: Self-pay

## 2018-09-03 DIAGNOSIS — R4789 Other speech disturbances: Secondary | ICD-10-CM | POA: Diagnosis not present

## 2018-09-03 DIAGNOSIS — Z79899 Other long term (current) drug therapy: Secondary | ICD-10-CM | POA: Diagnosis not present

## 2018-09-03 DIAGNOSIS — G510 Bell's palsy: Secondary | ICD-10-CM

## 2018-09-03 DIAGNOSIS — I10 Essential (primary) hypertension: Secondary | ICD-10-CM | POA: Diagnosis not present

## 2018-09-03 DIAGNOSIS — R42 Dizziness and giddiness: Secondary | ICD-10-CM | POA: Diagnosis not present

## 2018-09-03 DIAGNOSIS — R479 Unspecified speech disturbances: Secondary | ICD-10-CM | POA: Diagnosis not present

## 2018-09-03 LAB — CBC
HEMATOCRIT: 39.8 % (ref 36.0–46.0)
HEMOGLOBIN: 12.6 g/dL (ref 12.0–15.0)
MCH: 28.5 pg (ref 26.0–34.0)
MCHC: 31.7 g/dL (ref 30.0–36.0)
MCV: 90 fL (ref 80.0–100.0)
Platelets: 197 10*3/uL (ref 150–400)
RBC: 4.42 MIL/uL (ref 3.87–5.11)
RDW: 13.3 % (ref 11.5–15.5)
WBC: 5.4 10*3/uL (ref 4.0–10.5)
nRBC: 0 % (ref 0.0–0.2)

## 2018-09-03 LAB — DIFFERENTIAL
Abs Immature Granulocytes: 0.02 10*3/uL (ref 0.00–0.07)
Basophils Absolute: 0 10*3/uL (ref 0.0–0.1)
Basophils Relative: 0 %
Eosinophils Absolute: 0.1 10*3/uL (ref 0.0–0.5)
Eosinophils Relative: 1 %
Immature Granulocytes: 0 %
LYMPHS PCT: 36 %
Lymphs Abs: 1.9 10*3/uL (ref 0.7–4.0)
MONO ABS: 0.3 10*3/uL (ref 0.1–1.0)
MONOS PCT: 5 %
NEUTROS ABS: 3.1 10*3/uL (ref 1.7–7.7)
Neutrophils Relative %: 58 %

## 2018-09-03 LAB — I-STAT CHEM 8, ED
BUN: 18 mg/dL (ref 6–20)
CALCIUM ION: 1.13 mmol/L — AB (ref 1.15–1.40)
CHLORIDE: 106 mmol/L (ref 98–111)
Creatinine, Ser: 0.7 mg/dL (ref 0.44–1.00)
GLUCOSE: 89 mg/dL (ref 70–99)
HCT: 38 % (ref 36.0–46.0)
HEMOGLOBIN: 12.9 g/dL (ref 12.0–15.0)
Potassium: 3.8 mmol/L (ref 3.5–5.1)
Sodium: 140 mmol/L (ref 135–145)
TCO2: 25 mmol/L (ref 22–32)

## 2018-09-03 LAB — COMPREHENSIVE METABOLIC PANEL
ALK PHOS: 59 U/L (ref 38–126)
ALT: 13 U/L (ref 0–44)
ANION GAP: 8 (ref 5–15)
AST: 15 U/L (ref 15–41)
Albumin: 3.9 g/dL (ref 3.5–5.0)
BUN: 15 mg/dL (ref 6–20)
CHLORIDE: 106 mmol/L (ref 98–111)
CO2: 25 mmol/L (ref 22–32)
CREATININE: 0.65 mg/dL (ref 0.44–1.00)
Calcium: 9.5 mg/dL (ref 8.9–10.3)
Glucose, Bld: 92 mg/dL (ref 70–99)
Potassium: 3.7 mmol/L (ref 3.5–5.1)
Sodium: 139 mmol/L (ref 135–145)
TOTAL PROTEIN: 7.2 g/dL (ref 6.5–8.1)
Total Bilirubin: 0.5 mg/dL (ref 0.3–1.2)

## 2018-09-03 LAB — PROTIME-INR
INR: 1.07
PROTHROMBIN TIME: 13.8 s (ref 11.4–15.2)

## 2018-09-03 LAB — I-STAT TROPONIN, ED: TROPONIN I, POC: 0.01 ng/mL (ref 0.00–0.08)

## 2018-09-03 LAB — APTT: aPTT: 29 seconds (ref 24–36)

## 2018-09-03 MED ORDER — METOCLOPRAMIDE HCL 5 MG/ML IJ SOLN
10.0000 mg | Freq: Once | INTRAMUSCULAR | Status: DC
  Filled 2018-09-03: qty 2

## 2018-09-03 MED ORDER — VALACYCLOVIR HCL 1 G PO TABS: 1000.0000 mg | ORAL_TABLET | Freq: Three times a day (TID) | ORAL | 0 refills | Status: DC

## 2018-09-03 MED ORDER — PREDNISONE 20 MG PO TABS: ORAL_TABLET | ORAL | 0 refills | Status: DC

## 2018-09-03 NOTE — ED Notes (Signed)
Patient transported to CT 

## 2018-09-03 NOTE — ED Provider Notes (Signed)
52 yo F that I received in signout from Dr. Madilyn Hook.  Briefly patient is a 52 year old female who at about 9 AM this morning had an episode where she felt like she was going to pass out.  After which she felt that both of her legs were weak and she had difficulty finding words to speak.  Bystanders felt that part of her mouth was not racing as it normally would.  Eventually they talked her into coming to the hospital.  She has had events like this in the past where she felt she passed out and then had a prolonged episode of feeling weak.  They usually did not last this long.  She now started having a headache to the vertex of her head that goes to the occiput.  She denies head injury.  Denies nausea or vomiting.  She still feels like she is having some issue with bilateral leg weakness.  Her word finding issues have improved significantly.  On my exam the patient has right-sided facial droop that includes the forehead.  There are no signs of a rash to the right side of her face.  She has no eye symptoms.  Otherwise her neuro exam is unremarkable.  The plan from Dr. Madilyn Hook was to obtain a CT of her head and then evaluate for possible MRI.  As the patient has forehead involvement I will discuss the case with neurology.  The possibility of complicated migraine was also discussed with the patient.  At this time she is declining any injectable medications.  I discussed case with Dr. Otelia Limes, neurology he recommended an MRI of the brain and MRA of the head.  The studies are read as negative.  Patient is still having some symptomatology.  As discussed with Dr. Otelia Limes at this point I will discharge the patient home and have them follow-up with the neurologist.  Will treat for possible Bell's palsy with right forehead involvement.   Melene Plan, DO 09/03/18 2138

## 2018-09-03 NOTE — ED Notes (Signed)
Patient verbalizes understanding of medications and discharge instructions. No further questions at this time. VSS and patient ambulatory at discharge.   

## 2018-09-03 NOTE — ED Triage Notes (Addendum)
Pt states around 0900 she began having dizziness, trouble talking, she feels like she has "cotton mouth", some weakness to bilateral lower legs from knees down. She also has a headache that progressed throughout the day. Denies chest pain. States she had a similar event a few months ago at work. Her speech is clear on assessment. Pt friends brought back to triage, her mouth is asymmetrical to the left side and she has delayed speech responses. No drift noted.

## 2018-09-03 NOTE — ED Notes (Signed)
Patient transported to MRI 

## 2018-09-03 NOTE — ED Provider Notes (Signed)
[facility] EMERGENCY DEPARTMENT Provider Note   CSN: [id number] Arrival date & time: 09/03/18  1413     History   Chief Complaint Chief Complaint  Patient presents with  . Dizziness  . trouble talking  . Headache    HPI Heather Hamilton is a 52 y.o. female.  The history is provided by the patient. No language interpreter was used.   Heather Hamilton is a 52 y.o. female who presents to the Emergency Department complaining of dizziness. She presents the emergency department for evaluation of dizziness. She was in her routine state of health until 9 AM this morning when she began to feel dizzy described as a foggy headed sensation as if a wave is going over her head. She has associated difficulty talking and weakness and bilateral lower extremities. Overall her dizzy and foggy headedness have improved but she is developing a headache. She denies any fevers, chest pain, shortness of breath, nausea, vomiting, leg swelling or pain. She has had a similar but more mild event in the past. She takes no medications and has no known medical problems. Past Medical History:  Diagnosis Date  . Fall at home   . GERD (gastroesophageal reflux disease)   . Hypertension     Patient Active Problem List   Diagnosis Date Noted  . Acute appendicitis 09/29/2016  . Acute blood loss anemia 05/05/2015  . Fall 05/02/2015  . [place] I fracture (HCC) 05/02/2015  . Cervical spinal stenosis 05/02/2015  . Syncope 05/02/2015  . Central cord syndrome (HCC) 05/02/2015  . Hypertension 05/02/2015  . Faintness     Past Surgical History:  Procedure Laterality Date  . CESAREAN SECTION    . CHOLECYSTECTOMY N/A 07/09/2014   Procedure: LAPAROSCOPIC CHOLECYSTECTOMY;  Surgeon: Liz Malady, MD;  Location: [facility] OR;  Service: General;  Laterality: N/A;  . LAPAROSCOPIC APPENDECTOMY N/A 09/29/2016   Procedure: APPENDECTOMY LAPAROSCOPIC;  Surgeon: Jimmye Norman, MD;  Location: [facility] OR;  Service: General;   Laterality: N/A;  . MANDIBULAR HARDWARE REMOVAL N/A 05/13/2015   Procedure: MANDIBILE HARDWARE REMOVAL;  Surgeon: Suzanna Obey, MD;  Location: [facility];  Service: ENT;  Laterality: N/A;  . ORIF MANDIBULAR FRACTURE Bilateral 05/03/2015   Procedure: OPEN REDUCTION INTERNAL FIXATION (ORIF) MAXILLA FRACTURE ;  Surgeon: Suzanna Obey, MD;  Location: [facility] OR;  Service: ENT;  Laterality: Bilateral;     OB History   None      Home Medications    Prior to Admission medications   Medication Sig Start Date End Date Taking? Authorizing Provider  acetaminophen (TYLENOL) 325 MG tablet Take 2 tablets (650 mg total) by mouth every 6 (six) hours as needed for mild pain, fever or headache. 10/01/16   Sherrie George, PA-C  ibuprofen (ADVIL,MOTRIN) 200 MG tablet Take 400 mg by mouth every 6 (six) hours as needed.    [provider]  lisinopril (PRINIVIL,ZESTRIL) 20 MG tablet Take 20 mg by mouth daily.    [provider]  traMADol (ULTRAM) 50 MG tablet Take 1-2 tablets (50-100 mg total) by mouth every 6 (six) hours as needed for moderate pain. 10/01/16   Sherrie George, PA-C    Family History Family History  Problem Relation Age of Onset  . Breast cancer Mother 17       deceased at age 34    Social History Social History   Tobacco Use  . Smoking status: Never Smoker  . Smokeless tobacco: Never Used  Substance Use Topics  .  Alcohol use: No  . Drug use: No     Allergies   Codeine   Review of Systems Review of Systems  All other systems reviewed and are negative.    Physical Exam Updated Vital Signs BP (!) 150/105   Pulse 68   Temp 97.7 F (36.5 C)   Resp 18   LMP 06/16/2014   SpO2 100%   Physical Exam  Constitutional: She is oriented to person, place, and time. She appears well-developed and well-nourished.  HENT:  Head: Normocephalic and atraumatic.  Cardiovascular: Normal rate and regular rhythm.  No murmur heard. Pulmonary/Chest: Effort  normal and breath sounds normal. No respiratory distress.  Abdominal: Soft. There is no tenderness. There is no rebound and no guarding.  Musculoskeletal: She exhibits no edema or tenderness.  Neurological: She is alert and oriented to person, place, and time.  Visual fields are grossly intact. EOM I. No pronator drift. There is mild right lower facial weakness. 4/5 strength and bilateral lower extremities, five out of five strength and bilateral upper extremities. Sensation to light touch intact in all four extremities. Slow speech with normal content and process.  Skin: Skin is warm and dry.  Psychiatric: She has a normal mood and affect. Her behavior is normal.  Nursing note and vitals reviewed.    ED Treatments / Results  Labs (all labs ordered are listed, but only abnormal results are displayed) Labs Reviewed  PROTIME-INR  APTT  CBC  DIFFERENTIAL  COMPREHENSIVE METABOLIC PANEL  I-STAT TROPONIN, ED  I-STAT CHEM 8, ED  CBG MONITORING, ED    EKG None  Radiology No results found.  Procedures Procedures (including critical care time)  Medications Ordered in ED Medications - No data to display   Initial Impression / Assessment and Plan / ED Course  I have reviewed the triage vital signs and the nursing notes.  Pertinent labs & imaging results that were available during my care of the patient were reviewed by me and considered in my medical decision making (see chart for details).     Patient here for evaluation of foggy headedness, speech difficulties. She does have mild right facial droop on examination, slow but fluent speech. She has mild weakness to bilateral lower extremities. Plan to check labs, CT had to further evaluate symptoms. Patient care transferred pending further workup.  Final Clinical Impressions(s) / ED Diagnoses   Final diagnoses:  None    ED Discharge Orders    None       Tilden Fossa, MD 09/03/18 1550

## 2018-09-03 NOTE — ED Notes (Signed)
Explained delay to patient who was "tearful" during exam. States "I'm just ready to go home, this is taking forever". Called MRI patient is next. Patient agrees to wait for MRI at this time.

## 2018-09-04 ENCOUNTER — Encounter: Payer: Self-pay | Admitting: Neurology

## 2018-10-04 DIAGNOSIS — I10 Essential (primary) hypertension: Secondary | ICD-10-CM | POA: Diagnosis not present

## 2018-10-23 ENCOUNTER — Ambulatory Visit (INDEPENDENT_AMBULATORY_CARE_PROVIDER_SITE_OTHER): Payer: BLUE CROSS/BLUE SHIELD | Admitting: Neurology

## 2018-10-23 ENCOUNTER — Encounter: Payer: Self-pay | Admitting: Neurology

## 2018-10-23 VITALS — BP 130/90 | HR 70 | Ht 65.0 in | Wt 180.5 lb

## 2018-10-23 DIAGNOSIS — M4802 Spinal stenosis, cervical region: Secondary | ICD-10-CM | POA: Diagnosis not present

## 2018-10-23 DIAGNOSIS — R42 Dizziness and giddiness: Secondary | ICD-10-CM | POA: Diagnosis not present

## 2018-10-23 DIAGNOSIS — R202 Paresthesia of skin: Secondary | ICD-10-CM | POA: Diagnosis not present

## 2018-10-23 NOTE — Patient Instructions (Addendum)
1.  Routine EEG 2.  NCS/EMG right arm and leg 3.  Ultrasound carotids   ELECTROMYOGRAM AND NERVE CONDUCTION STUDIES (EMG/NCS) INSTRUCTIONS  How to Prepare The neurologist conducting the EMG will need to know if you have certain medical conditions. Tell the neurologist and other EMG lab personnel if you: . Have a pacemaker or any other electrical medical device . Take blood-thinning medications . Have hemophilia, a blood-clotting disorder that causes prolonged bleeding Bathing Take a shower or bath shortly before your exam in order to remove oils from your skin. Don't apply lotions or creams before the exam.  What to Expect You'll likely be asked to change into a hospital gown for the procedure and lie down on an examination table. The following explanations can help you understand what will happen during the exam.  . Electrodes. The neurologist or a technician places surface electrodes at various locations on your skin depending on where you're experiencing symptoms. Or the neurologist may insert needle electrodes at different sites depending on your symptoms.  . Sensations. The electrodes will at times transmit a tiny electrical current that you may feel as a twinge or spasm. The needle electrode may cause discomfort or pain that usually ends shortly after the needle is removed. If you are concerned about discomfort or pain, you may want to talk to the neurologist about taking a short break during the exam.  . Instructions. During the needle EMG, the neurologist will assess whether there is any spontaneous electrical activity when the muscle is at rest - activity that isn't present in healthy muscle tissue - and the degree of activity when you slightly contract the muscle.  He or she will give you instructions on resting and contracting a muscle at appropriate times. Depending on what muscles and nerves the neurologist is examining, he or she may ask you to change positions during the exam.   After your EMG You may experience some temporary, minor bruising where the needle electrode was inserted into your muscle. This bruising should fade within several days. If it persists, contact your primary care doctor.

## 2018-10-23 NOTE — Progress Notes (Signed)
[facility] Neu[facility] Clinic Note - Initial Visit   Date: 10/23/18  Heather Hamilton MRN: [id number] DOB: 1966/04/25   Dear Dr. Adela Lank:  Thank you for your kind referral of Heather Hamilton for consultation of spells of dizziness. Although her history is well known to you, please allow [place] to reiterate it for the purpose of our medical record. The patient was accompanied to the clinic by son who also provides collateral information.     History of Present Illness: Heather Hamilton is a 52 y.o. right-handed Caucasian female with hypertension presenting for evaluation of spells of dizziness, numbness of the hands and feet, and cramps. This has been ongoing since 2016 and she describes it as lightheadedness.  She can tell when she is going to have a spell because she feels it rising sensation from her chest into her head which lasts 10 seconds, and then is followed by lightheadedness.  During this time, she can feel unsteady, often having to sit down, close her eyes, or hold onto something.  Sometimes, her speech slurs and she gets tunnel vision.  Following this, she cannot always remember things, stating that she got lost trying to go to urgent care with her last episode. This spells can last anywhere from 30 seconds to 2 minutes and she is usually back to herself in 30 minutes.  These spells occur randomly without any triggers, from one everyday to once every 3 weeks.  She does not have headaches. She has fallen several times with this, and injured her face in 2016.  She went to the ER on 09/03/2018 because someone noticed mild right facial droop, slurred her speech and she was feeling unwell.  MRI/A brain was normal.  Her facial weakness has since resolved.   Previously, she reports being evaluated by [facility], however, I do not see any of their office notes.  She was referred to cardiology and had normal stress test.  She had not had an event monitor.   She has spells of hand cramps and  constant numbness of the toes and fingers.  She also complains of tripping easier and having falls. In June 2016, she has a severe spell of dizziness and fell injuring her face. She also has neck pain and hand cramps since this time. MRI cervical spine from June 2016 showed bilateral foraminal stenosis at C4-5 and C6-7.   Out-side paper records, electronic medical record, and images have been reviewed where available and summarized as:  MRI/A brain 09/03/2018: 1. No acute finding or explanation for symptoms. 2. Normal intracranial MRA.  MRI cervical spine wo contrast 05/02/2015: 1. Mildly motion degraded study. 2. No occult fracture, ligament tear, or cord injury. 3. Mid and lower cervical degenerative disc disease, as above, with advanced bilateral C4-5 and C6-7 foraminal stenosis.  Past Medical History:  Diagnosis Date  . Fall at home   . GERD (gastroesophageal reflux disease)   . Hypertension     Past Surgical History:  Procedure Laterality Date  . CESAREAN SECTION    . CHOLECYSTECTOMY N/A 07/09/2014   Procedure: LAPAROSCOPIC CHOLECYSTECTOMY;  Surgeon: Liz Malady, MD;  Location: [facility] OR;  Service: General;  Laterality: N/A;  . LAPAROSCOPIC APPENDECTOMY N/A 09/29/2016   Procedure: APPENDECTOMY LAPAROSCOPIC;  Surgeon: Jimmye Norman, MD;  Location: [facility] OR;  Service: General;  Laterality: N/A;  . MANDIBULAR HARDWARE REMOVAL N/A 05/13/2015   Procedure: MANDIBILE HARDWARE REMOVAL;  Surgeon: Suzanna Obey, MD;  Location: [facility];  Service: ENT;  Laterality: N/A;  . ORIF MANDIBULAR FRACTURE Bilateral 05/03/2015   Procedure: OPEN REDUCTION INTERNAL FIXATION (ORIF) MAXILLA FRACTURE ;  Surgeon: Suzanna ObeyJohn Byers, MD;  Location: [facility] OR;  Service: ENT;  Laterality: Bilateral;     Medications:  Outpatient Encounter Medications as of 10/23/2018  Medication Sig  . acetaminophen (TYLENOL) 325 MG tablet Take 2 tablets (650 mg total) by mouth every 6 (six) hours as needed for mild pain, fever  or headache.  Marland Kitchen. amLODipine (NORVASC) 5 MG tablet Take 2.5 mg by mouth daily.   Marland Kitchen. ibuprofen (ADVIL,MOTRIN) 200 MG tablet Take 800 mg by mouth every 6 (six) hours as needed for headache (pain).   . [DISCONTINUED] predniSONE (DELTASONE) 20 MG tablet 3 tabs po daily x 7 days  . [DISCONTINUED] traMADol (ULTRAM) 50 MG tablet Take 1-2 tablets (50-100 mg total) by mouth every 6 (six) hours as needed for moderate pain. (Patient not taking: Reported on 09/03/2018)  . [DISCONTINUED] valACYclovir (VALTREX) 1000 MG tablet Take 1 tablet (1,000 mg total) by mouth 3 (three) times daily.   No facility-administered encounter medications on file as of 10/23/2018.      Allergies:  Allergies  Allergen Reactions  . Codeine Nausea Only and Other (See Comments)    Makes very heated   . Adhesive [Tape] Rash    pls use paper tape    Family History: Family History  Problem Relation Age of Onset  . Breast cancer Mother [id number]       deceased at age 660  . Lung cancer Mother   . Other Father        Work-related accidental death  . Diabetes Maternal Grandmother   . Dementia Maternal Grandmother   . Dementia Paternal Grandmother     Social History: Social History   Tobacco Use  . Smoking status: Never Smoker  . Smokeless tobacco: Never Used  Substance Use Topics  . Alcohol use: No  . Drug use: No   Social History   Social History Narrative   Lives with daughter and granddaughter.     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      Has 2 children.     Education: high school.     Review of Systems:  CONSTITUTIONAL: No fevers, chills, night sweats, or weight loss.   EYES: No visual changes or eye pain ENT: No hearing changes.  No history of nose bleeds.   RESPIRATORY: No cough, wheezing and shortness of breath.   CARDIOVASCULAR: Negative for chest pain, and palpitations.   GI: Negative for abdominal discomfort, blood in stools or black stools.  No recent change in bowel habits.   GU:  No history of incontinence.    MUSCLOSKELETAL: No history of joint pain or swelling.  No myalgias.   SKIN: Negative for lesions, rash, and itching.   HEMATOLOGY/ONCOLOGY: Negative for prolonged bleeding, bruising easily, and swollen nodes.  No history of cancer.   ENDOCRINE: Negative for cold or heat intolerance, polydipsia or goiter.   PSYCH:  No depression +anxiety symptoms.   NEURO: As Above.   Vital Signs:  BP 130/90   Pulse 70   Ht 5\' 5"  (1.651 m)   Wt 180 lb 8 oz (81.9 kg)   LMP 06/16/2014   SpO2 99%   BMI 30.04 kg/m    General Medical Exam:   General:  Well appearing, comfortable.   Eyes/ENT: see cranial nerve examination.   Neck: No masses appreciated.  Full range of motion without tenderness.  No carotid bruits.  Respiratory:  Clear to auscultation, good air entry bilaterally.   Cardiac:  Regular rate and rhythm, no murmur.   Extremities:  No deformities, edema, or skin discoloration.  Skin:  No rashes or lesions.  Neurological Exam: MENTAL STATUS including orientation to time, place, person, recent and remote memory, attention span and concentration, language, and fund of knowledge is normal.  Speech is not dysarthric.  CRANIAL NERVES: II:  No visual field defects.  Unremarkable fundi.   III-IV-VI: Pupils equal round and reactive to light.  Normal conjugate, extra-ocular eye movements in all directions of gaze.  No nystagmus.  No ptosis.   V:  Normal facial sensation.     VII:  Normal facial symmetry and movements. VIII:  Normal hearing and vestibular function.   IX-X:  Normal palatal movement.   XI:  Normal shoulder shrug and head rotation.   XII:  Normal tongue strength and range of motion, no deviation or fasciculation.  MOTOR:  No atrophy, fasciculations or abnormal movements.  No pronator drift.  Tone is normal.    Right Upper Extremity:    Left Upper Extremity:    Deltoid  5/5   Deltoid  5/5   Biceps  5/5   Biceps  5/5   Triceps  5/5   Triceps  5/5   Wrist extensors  5/5   Wrist  extensors  5/5   Wrist flexors  5/5   Wrist flexors  5/5   Finger extensors  5/5   Finger extensors  5/5   Finger flexors  5/5   Finger flexors  5/5   Dorsal interossei  5/5   Dorsal interossei  5/5   Abductor pollicis  5/5   Abductor pollicis  5/5   Tone (Ashworth scale)  0  Tone (Ashworth scale)  0   Right Lower Extremity:    Left Lower Extremity:    Hip flexors  5/5   Hip flexors  5/5   Hip extensors  5/5   Hip extensors  5/5   Knee flexors  5/5   Knee flexors  5/5   Knee extensors  5/5   Knee extensors  5/5   Dorsiflexors  5/5   Dorsiflexors  5/5   Plantarflexors  5/5   Plantarflexors  5/5   Toe extensors  5/5   Toe extensors  5/5   Toe flexors  5/5   Toe flexors  5/5   Tone (Ashworth scale)  0  Tone (Ashworth scale)  0   MSRs:  Right                                                                 Left brachioradialis 2+  brachioradialis 2+  biceps 2+  biceps 2+  triceps 2+  triceps 2+  patellar 2+  patellar 2+  ankle jerk 2+  ankle jerk 2+  Hoffman no  Hoffman no  plantar response down  plantar response down   SENSORY:  Vibration is reduced at the great toe, intact above the ankles.  Temperature is also mildly reduced at the toes.  Sensation in the hands is normal.  Romberg's sign absent.   COORDINATION/GAIT: Normal finger-to- nose-finger.  Intact rapid alternating movements bilaterally.   Gait narrow based and stable. Unsteady with tandem and stressed  gait, but able to perform.    IMPRESSION: 1.  Spells of lightheadedness associated with speech changes and rising sensation.  There is no loss of consciousness or syncope.  Given the  stereotypic nature of these spells, need to evaluate for complex partial seizures with routine EEG.  If negative, she may need ambulatory EEG and/or cardiac event monitor for arrhythmia. She does not loose consciousness and has not had these events when driving, so she can continue to drive.   I will check US carotids to be assess for carotid  disease, but my overall suspicion is low.   2.  Paresthesias of the hands and feet.  Check NCS/EMG of the right arm and leg.  3.  Right facial weakness is not apparent on today's exam.  Her neurological exam shows mild unsteadiness with stressed and tandem gait, but otherwise nonfocal.  MRI/A brain personally viewed and is normal.   4.  Cervical bilateral C4-5 and C6-7 foraminal stenosis, possibly contributing to cramps.    Further recommendations pending results   Thank you for allowing me to participate in patient's care.  If I can answer any additional questions, I would be pleased to do so.    Sincerely,    Wheeler Incorvaia K. Allena Katz, DO

## 2018-10-26 ENCOUNTER — Ambulatory Visit (INDEPENDENT_AMBULATORY_CARE_PROVIDER_SITE_OTHER): Payer: BLUE CROSS/BLUE SHIELD | Admitting: Neurology

## 2018-10-26 DIAGNOSIS — R202 Paresthesia of skin: Secondary | ICD-10-CM

## 2018-10-26 DIAGNOSIS — M5412 Radiculopathy, cervical region: Secondary | ICD-10-CM

## 2018-10-26 NOTE — Procedures (Signed)
[facility]  [address] [place], [address]  [place], [place] [address] Tel: [phone] Fax:  [phone] Test Date:  10/26/2018  Patient: Heather Hamilton DOB: November 23, 1965 Physician: Nita Sickle, DO  Sex: Female Height: 5\' 5"  Ref Phys: Nita Sickle, DO  ID#: [id number] Temp: 35.0C Technician:    Patient Complaints: This is a 51 year old female referred for evaluation of falls and numbness involving the hands and feet.  NCV & EMG Findings: Extensive electrodiagnostic testing of the right upper and lower extremities and additional studies of the left upper extremity shows:  1. All sensory responses including the right median, ulnar, mixed palmar, sural, and superficial peroneal nerves are within normal limits. 2. All motor responses including the right median, ulnar, peroneal, and tibial nerves are within normal limits. 3. Right tibial H reflex study is within normal limits. 4. In the upper extremities, chronic motor axonal loss changes are seen affecting the C5-7 myotomes bilaterally, without accompanied active denervation. 5. There is no evidence of active or chronic motor axonal loss changes affecting the right lower extremity.  Impression: 1. Chronic C5-7 radiculopathy affecting bilateral upper extremities, moderate in degree electrically. 2. There is no evidence of a sensorimotor polyneuropathy or right lumbosacral radiculopathy.   ___________________________ Nita Sickle, DO    Nerve Conduction Studies Anti Sensory Summary Table   Site NR Peak (ms) Norm Peak (ms) P-T Amp (V) Norm P-T Amp  Right Median Anti Sensory (2nd Digit)  35C  Wrist    3.1 <3.6 27.7 >15  Right Sup Peroneal Anti Sensory (Ant Lat Mall)  35C  12 cm    2.1 <4.6 10.4 >4  Right Sural Anti Sensory (Lat Mall)  35C  Calf    3.5 <4.6 15.0 >4  Right Ulnar Anti Sensory (5th Digit)  35C  Wrist    2.3 <3.1 32.8 >10   Motor Summary Table   Site NR Onset (ms) Norm Onset (ms) O-P Amp (mV) Norm O-P Amp  Site1 Site2 Delta-0 (ms) Dist (cm) Vel (m/s) Norm Vel (m/s)  Right Median Motor (Abd Poll Brev)  35C  Wrist    2.6 <4.0 13.2 >6 Elbow Wrist 4.4 27.0 61 >50  Elbow    7.0  12.7         Right Peroneal Motor (Ext Dig Brev)  35C  Ankle    3.0 <6.0 6.0 >2.5 B Fib Ankle 6.9 37.0 54 >40  B Fib    9.9  5.3  Poplt B Fib 1.6 8.0 50 >40  Poplt    11.5  5.2         Right Tibial Motor (Abd Hall Brev)  35C  Ankle    5.2 <6.0 14.4 >4 Knee Ankle 6.5 38.0 58 >40  Knee    11.7  8.5         Right Ulnar Motor (Abd Dig Minimi)  35C  Wrist    2.2 <3.1 9.2 >7 B Elbow Wrist 3.4 23.0 68 >50  B Elbow    5.6  8.9  A Elbow B Elbow 1.6 10.0 62 >50  A Elbow    7.2  8.4          Comparison Summary Table   Site NR Peak (ms) Norm Peak (ms) P-T Amp (V) Site1 Site2 Delta-P (ms) Norm Delta (ms)  Right Median/Ulnar Palm Comparison (Wrist - 8cm)  35C  Median Palm    1.8 <2.2 59.1 Median Palm Ulnar Palm 0.3   Ulnar Palm    1.5 <2.2  14.9       H Reflex Studies   NR H-Lat (ms) Lat Norm (ms) L-R H-Lat (ms)  Right Tibial (Gastroc)  35C     34.56 <35    EMG   Side Muscle Ins Act Fibs Psw Fasc Number Recrt Dur Dur. Amp Amp. Poly Poly. Comment  Right AntTibialis Nml Nml Nml Nml Nml Nml Nml Nml Nml Nml Nml Nml N/A  Right Gastroc Nml Nml Nml Nml Nml Nml Nml Nml Nml Nml Nml Nml N/A  Right Flex Dig Long Nml Nml Nml Nml Nml Nml Nml Nml Nml Nml Nml Nml N/A  Right RectFemoris Nml Nml Nml Nml Nml Nml Nml Nml Nml Nml Nml Nml N/A  Right GluteusMed Nml Nml Nml Nml Nml Nml Nml Nml Nml Nml Nml Nml N/A  Right 1stDorInt Nml Nml Nml Nml Nml Nml Nml Nml Nml Nml Nml Nml N/A  Right PronatorTeres Nml Nml Nml Nml 1- Rapid Some 1+ Some 1+ Nml Nml N/A  Right Biceps Nml Nml Nml Nml 2- Rapid Many 1+ Many 1+ Nml Nml N/A  Right Triceps Nml Nml Nml Nml 1- Rapid Some 1+ Some 1+ Nml Nml N/A  Right Deltoid Nml Nml Nml Nml 2- Rapid Many 1+ Many 1+ Nml Nml N/A  Left PronatorTeres Nml Nml Nml Nml 1- Rapid Some 1+ Some 1+ Nml Nml N/A  Left Biceps  Nml Nml Nml Nml 1- Rapid Some 1+ Some 1+ Nml Nml N/A  Left Triceps Nml Nml Nml Nml 1- Rapid Some 1+ Some 1+ Nml Nml N/A  Left Deltoid Nml Nml Nml Nml 1- Rapid Some 1+ Some 1+ Nml Nml N/A      Waveforms:

## 2018-10-27 ENCOUNTER — Ambulatory Visit
Admission: RE | Admit: 2018-10-27 | Discharge: 2018-10-27 | Disposition: A | Discharge status: Home / Self Care | Payer: BLUE CROSS/BLUE SHIELD | Source: Ambulatory Visit | Attending: Neurology | Admitting: Neurology

## 2018-10-27 DIAGNOSIS — R42 Dizziness and giddiness: Secondary | ICD-10-CM | POA: Diagnosis not present

## 2018-10-30 ENCOUNTER — Telehealth: Payer: Self-pay | Admitting: Neurology

## 2018-10-30 NOTE — Telephone Encounter (Signed)
Please inform patient that her ultrasound carotids are normal and nerve testing shows impingement of the nerve in the neck region, which can cause numbness/tingling of the hands.  The nerve testing in the legs is normal - no sign of neuropathy.   For her neck, MRI from 2016 shows she has some narrowing at C4-5 and C6-7.  We can refer her to neck PT to see if this helps with numbness/tingling and cramps.    EEG is pending.  Bienvenido Proehl K. Allena KatzPatel, DO

## 2018-10-30 NOTE — Telephone Encounter (Signed)
Please advise 

## 2018-10-30 NOTE — Telephone Encounter (Signed)
Patient called to check on EMG results. Thanks

## 2018-10-31 ENCOUNTER — Encounter: Payer: Self-pay | Admitting: *Deleted

## 2018-10-31 NOTE — Telephone Encounter (Signed)
Results sent via My Chart.  Requested for patient to let me know if she would like to start PT.

## 2018-11-01 ENCOUNTER — Other Ambulatory Visit: Payer: BLUE CROSS/BLUE SHIELD

## 2018-11-02 ENCOUNTER — Other Ambulatory Visit: Payer: Self-pay | Admitting: *Deleted

## 2018-11-02 DIAGNOSIS — M4802 Spinal stenosis, cervical region: Secondary | ICD-10-CM

## 2018-11-02 DIAGNOSIS — R202 Paresthesia of skin: Secondary | ICD-10-CM

## 2018-11-02 DIAGNOSIS — M5412 Radiculopathy, cervical region: Secondary | ICD-10-CM

## 2018-11-13 ENCOUNTER — Ambulatory Visit (INDEPENDENT_AMBULATORY_CARE_PROVIDER_SITE_OTHER): Payer: BLUE CROSS/BLUE SHIELD | Admitting: Neurology

## 2018-11-13 DIAGNOSIS — R42 Dizziness and giddiness: Secondary | ICD-10-CM

## 2018-11-13 NOTE — Procedures (Signed)
ELECTROENCEPHALOGRAM REPORT  Date of Study: 11/13/2018  Patient's Name: Heather Hamilton MRN: [id number] Date of Birth: Feb 25, 1966  Referring Provider: Dr. Nita Sickleonika Patel  Clinical History: This is a 52 year old woman with recurrent episodes of lightheadedness with speech changes and rising sensation, followed by confusion.  Medications: TYLENOL 325 MG tablet  NORVASC 5 MG tablet  ADVIL,MOTRIN 200 MG tablet  Technical Summary: A multichannel digital EEG recording measured by the international 10-20 system with electrodes applied with paste and impedances below 5000 ohms performed in our laboratory with EKG monitoring in an awake and asleep patient.  Hyperventilation and photic stimulation were performed.  The digital EEG was referentially recorded, reformatted, and digitally filtered in a variety of bipolar and referential montages for optimal display.    Description: The patient is awake and asleep during the recording.  During maximal wakefulness, there is a symmetric, medium voltage 8-8.5 Hz posterior dominant rhythm that attenuates with eye opening.  The record is symmetric.  During drowsiness and sleep, there is an increase in theta slowing of the background.  Vertex waves and symmetric sleep spindles were seen.  Hyperventilation and photic stimulation did not elicit any abnormalities.  There were no epileptiform discharges or electrographic seizures seen.    EKG lead was unremarkable.  Impression: This awake and asleep EEG is normal.    Clinical Correlation: A normal EEG does not exclude a clinical diagnosis of epilepsy.  If further clinical questions remain, prolonged EEG may be helpful.  Clinical correlation is advised.   Patrcia DollyKaren Aquino, M.D.

## 2018-11-16 ENCOUNTER — Encounter: Payer: Self-pay | Admitting: *Deleted

## 2018-11-16 ENCOUNTER — Telehealth: Payer: Self-pay | Admitting: *Deleted

## 2018-11-16 ENCOUNTER — Encounter

## 2018-11-16 ENCOUNTER — Encounter: Payer: BLUE CROSS/BLUE SHIELD | Admitting: Neurology

## 2018-11-16 NOTE — Telephone Encounter (Signed)
Results sent via My Chart.  Requested for patient to let me know if she would like to proceed with the 48 hour EEG.

## 2018-11-16 NOTE — Telephone Encounter (Signed)
-----   Message from Glendale Chard, DO sent at 11/14/2018  3:50 PM EST ----- Please inform patient that her EEG was normal.  Did she have any events while hooked up to the EEG?  If not, I recommend that we order 48 hr ambulatory EEG. Thanks.

## 2018-11-17 DIAGNOSIS — R2681 Unsteadiness on feet: Secondary | ICD-10-CM | POA: Diagnosis not present

## 2018-11-17 DIAGNOSIS — M542 Cervicalgia: Secondary | ICD-10-CM | POA: Diagnosis not present

## 2018-11-17 DIAGNOSIS — M79601 Pain in right arm: Secondary | ICD-10-CM | POA: Diagnosis not present

## 2018-11-17 DIAGNOSIS — M545 Low back pain: Secondary | ICD-10-CM | POA: Diagnosis not present

## 2018-11-22 DIAGNOSIS — R2681 Unsteadiness on feet: Secondary | ICD-10-CM | POA: Diagnosis not present

## 2018-11-22 DIAGNOSIS — M542 Cervicalgia: Secondary | ICD-10-CM | POA: Diagnosis not present

## 2018-11-22 DIAGNOSIS — M545 Low back pain: Secondary | ICD-10-CM | POA: Diagnosis not present

## 2018-11-22 DIAGNOSIS — M79601 Pain in right arm: Secondary | ICD-10-CM | POA: Diagnosis not present

## 2018-11-27 ENCOUNTER — Other Ambulatory Visit: Payer: Self-pay | Admitting: *Deleted

## 2018-11-27 ENCOUNTER — Encounter

## 2018-11-27 ENCOUNTER — Ambulatory Visit: Payer: BLUE CROSS/BLUE SHIELD | Admitting: Neurology

## 2018-11-27 DIAGNOSIS — R42 Dizziness and giddiness: Secondary | ICD-10-CM

## 2018-12-01 DIAGNOSIS — M545 Low back pain: Secondary | ICD-10-CM | POA: Diagnosis not present

## 2018-12-01 DIAGNOSIS — M542 Cervicalgia: Secondary | ICD-10-CM | POA: Diagnosis not present

## 2018-12-01 DIAGNOSIS — R2681 Unsteadiness on feet: Secondary | ICD-10-CM | POA: Diagnosis not present

## 2018-12-01 DIAGNOSIS — M79601 Pain in right arm: Secondary | ICD-10-CM | POA: Diagnosis not present

## 2018-12-04 ENCOUNTER — Telehealth: Payer: Self-pay | Admitting: *Deleted

## 2018-12-04 DIAGNOSIS — M79601 Pain in right arm: Secondary | ICD-10-CM | POA: Diagnosis not present

## 2018-12-04 DIAGNOSIS — R2681 Unsteadiness on feet: Secondary | ICD-10-CM | POA: Diagnosis not present

## 2018-12-04 DIAGNOSIS — M542 Cervicalgia: Secondary | ICD-10-CM | POA: Diagnosis not present

## 2018-12-04 DIAGNOSIS — M545 Low back pain: Secondary | ICD-10-CM | POA: Diagnosis not present

## 2018-12-04 NOTE — Telephone Encounter (Signed)
Called numerous times since 11/16/2018 leaving a message. She returned my call once and said she will get back to me about the times we discussed. She has not called back and I have called 2 times since then.

## 2018-12-11 DIAGNOSIS — M545 Low back pain: Secondary | ICD-10-CM | POA: Diagnosis not present

## 2018-12-11 DIAGNOSIS — M79601 Pain in right arm: Secondary | ICD-10-CM | POA: Diagnosis not present

## 2018-12-11 DIAGNOSIS — M542 Cervicalgia: Secondary | ICD-10-CM | POA: Diagnosis not present

## 2018-12-11 DIAGNOSIS — R2681 Unsteadiness on feet: Secondary | ICD-10-CM | POA: Diagnosis not present

## 2018-12-12 DIAGNOSIS — M542 Cervicalgia: Secondary | ICD-10-CM | POA: Diagnosis not present

## 2018-12-12 DIAGNOSIS — M79601 Pain in right arm: Secondary | ICD-10-CM | POA: Diagnosis not present

## 2018-12-12 DIAGNOSIS — R2681 Unsteadiness on feet: Secondary | ICD-10-CM | POA: Diagnosis not present

## 2018-12-12 DIAGNOSIS — M545 Low back pain: Secondary | ICD-10-CM | POA: Diagnosis not present

## 2018-12-18 ENCOUNTER — Telehealth: Payer: Self-pay | Admitting: *Deleted

## 2018-12-18 DIAGNOSIS — M79601 Pain in right arm: Secondary | ICD-10-CM | POA: Diagnosis not present

## 2018-12-18 DIAGNOSIS — R2681 Unsteadiness on feet: Secondary | ICD-10-CM | POA: Diagnosis not present

## 2018-12-18 DIAGNOSIS — M545 Low back pain: Secondary | ICD-10-CM | POA: Diagnosis not present

## 2018-12-18 DIAGNOSIS — M542 Cervicalgia: Secondary | ICD-10-CM | POA: Diagnosis not present

## 2018-12-18 NOTE — Telephone Encounter (Signed)
Lmom for patient to call back to schedule. Thanks

## 2018-12-18 NOTE — Telephone Encounter (Signed)
It's best to address these questions in the office. There is a work-in follow-up visit on 2/5 at 10:10a, please offer this time to patient.

## 2018-12-18 NOTE — Telephone Encounter (Signed)
Called to set up ambulatory EEG. Heather Hamilton would like to talk to Dr. Allena Katz about the ambulatory EEG before incurring this expense without having an idea of what to expect in terms of yield. She asked how often are 48 hour tests abnormal with a normal routine EEG?  She said that there was mention of a heart monitor and that is less involved so she would like to discuss this with Dr. Allena Katz. She asked about the yield of a 48 hour ambulatory. I told her that it is impossible to predict the results of the test. She agreed and said she would really like to talk to Dr Allena Katz.

## 2018-12-20 ENCOUNTER — Ambulatory Visit (INDEPENDENT_AMBULATORY_CARE_PROVIDER_SITE_OTHER): Payer: BLUE CROSS/BLUE SHIELD | Admitting: Neurology

## 2018-12-20 ENCOUNTER — Encounter: Payer: Self-pay | Admitting: Neurology

## 2018-12-20 VITALS — BP 110/68 | HR 68 | Ht 65.0 in | Wt 185.0 lb

## 2018-12-20 DIAGNOSIS — R42 Dizziness and giddiness: Secondary | ICD-10-CM | POA: Diagnosis not present

## 2018-12-20 DIAGNOSIS — M5412 Radiculopathy, cervical region: Secondary | ICD-10-CM

## 2018-12-20 NOTE — Progress Notes (Signed)
Follow-up Visit   Date: 12/20/18    Heather Hamilton MRN: [id number] DOB: 1966-05-08   Interim History: Heather Hamilton is a 53 y.o. right-handed Caucasian female with hypertension and cervical radiculopathy returning for follow-up of spells of dizziness, numbness of the hands and feet. The patient was accompanied to the clinic by self.  History of present illness: Since 2016, she has had spells of is what is dizziness, paresthesias of the hands and feet, and cramps.  Her dizziness is most bothersome, which she describes as lightheadedness. She can tell when she is going to have a spell because she feels it rising sensation from her chest into her head which lasts 10 seconds, and then is followed by lightheadedness.  During this time, she can feel unsteady, often having to sit down, close her eyes, or hold onto something.  Sometimes, her speech slurs and she gets tunnel vision.  Following this, she cannot always remember things, stating that she got lost trying to go to urgent care with her last episode. This spells can last anywhere from 30 seconds to 2 minutes and she is usually back to herself in 30 minutes.  These spells occur randomly without any triggers, from one everyday to once every 3 weeks.  She does not have headaches. She has fallen several times with this, and injured her face in 2016.  She went to the ER on 09/03/2018 because someone noticed mild right facial droop, slurred her speech and she was feeling unwell.  MRI/A brain was normal.  Her facial weakness has since resolved.   Previously, she reports being evaluated by [facility], however, I do not see any of their office notes.  She was referred to cardiology and had normal stress test.  She had not had an event monitor.   She has spells of hand cramps and constant numbness of the toes and fingers.  She also complains of tripping easier and having falls. In June 2016, she has a severe spell of dizziness and fell injuring her face. She  also has neck pain and hand cramps since this time. MRI cervical spine from June 2016 showed bilateral foraminal stenosis at C4-5 and C6-7.   UPDATE 12/20/2018: She is here for follow-up visit.  Since her last visit, patient has underwent routine EEG and ultrasound carotids which was normal.  She also had NCS/EMG of the arms and right leg which showed chronic C6-7 radiculopathy bilaterally, no evidence of neuropathy or lumbosacral radiculopathy.  She was then referred to physical therapy and has noticed improved hand paresthesias and neck stiffness.  Her dizzy spells continued to recur intermittently, they are very random.  A few weeks ago, she had a spell lasting about 2 minutes, and again no loss of consciousness, no dysarthria, vision changes, or limb weakness.     Medications:  Current Outpatient Medications on File Prior to Visit  Medication Sig Dispense Refill  . acetaminophen (TYLENOL) 325 MG tablet Take 2 tablets (650 mg total) by mouth every 6 (six) hours as needed for mild pain, fever or headache.    Marland Kitchen amLODipine (NORVASC) 5 MG tablet Take 2.5 mg by mouth daily.   1  . ibuprofen (ADVIL,MOTRIN) 200 MG tablet Take 800 mg by mouth every 6 (six) hours as needed for headache (pain).      No current facility-administered medications on file prior to visit.     Allergies:  Allergies  Allergen Reactions  . Codeine Nausea Only and Other (See Comments)  Makes very heated   . Adhesive [Tape] Rash    pls use paper tape    Review of Systems:  CONSTITUTIONAL: No fevers, chills, night sweats, or weight loss.  EYES: No visual changes or eye pain ENT: No hearing changes.  No history of nose bleeds.   RESPIRATORY: No cough, wheezing and shortness of breath.   CARDIOVASCULAR: Negative for chest pain, and palpitations.   GI: Negative for abdominal discomfort, blood in stools or black stools.  No recent change in bowel habits.   GU:  No history of incontinence.   MUSCLOSKELETAL: No history of  joint pain or swelling.  No myalgias.   SKIN: Negative for lesions, rash, and itching.   ENDOCRINE: Negative for cold or heat intolerance, polydipsia or goiter.   PSYCH:  No depression or anxiety symptoms.   NEURO: As Above.   Vital Signs:  BP 110/68   Pulse 68   Ht 5\' 5"  (1.651 m)   Wt 185 lb (83.9 kg)   LMP 06/16/2014   SpO2 98%   BMI 30.79 kg/m   General Medical Exam:   General:  Well appearing, comfortable  Eyes/ENT: see cranial nerve examination.   Neck: No masses appreciated.  Full range of motion without tenderness.  No carotid bruits. Respiratory:  Clear to auscultation, good air entry bilaterally.   Cardiac:  Regular rate and rhythm, no systolic murmur.   Ext:  No edema  Neurological Exam: MENTAL STATUS including orientation to time, place, person, recent and remote memory, attention span and concentration, language, and fund of knowledge is normal.  Speech is not dysarthric.  CRANIAL NERVES: No visual field defects.  Pupils equal round and reactive to light.  Normal conjugate, extra-ocular eye movements in all directions of gaze.  No ptosis . Normal facial sensation.  Face is symmetric. Palate elevates symmetrically.  Tongue is midline.  MOTOR:  Motor strength is 5/5 in all extremities.  No atrophy, fasciculations or abnormal movements.  No pronator drift.  Tone is normal.    MSRs:  Reflexes are 2+/4 throughout.  SENSORY:  Intact to vibration throughout.  COORDINATION/GAIT:    Gait narrow based and stable.   Data: MRI/A brain 09/03/2018: 1. No acute finding or explanation for symptoms. 2. Normal intracranial MRA.  MRI cervical spine wo contrast 05/02/2015: 1. Mildly motion degraded study. 2. No occult fracture, ligament tear, or cord injury. 3. Mid and lower cervical degenerative disc disease, as above, with advanced bilateral C4-5 and C6-7 foraminal stenosis.  NCS/EMG of the arms and right leg 10/26/2018:   1. Chronic C5-7 radiculopathy affecting  bilateral upper extremities, moderate in degree electrically. 2. There is no evidence of a sensorimotor polyneuropathy or right lumbosacral radiculopathy.  Routine EEG 11/27/2018:  Normal   IMPRESSION/PLAN: 1.  Spells of dizziness, lasting 10 seconds - several minutes.  There is no loss of consciousness or syncope.   - Ultrasound carotids, MRI/A head, routine EEG, and exam is normal which is reassuring  - Recommend extended cardiac monitoring to see if these spells are due to cardiac arrhythmia   - Consider ambulatory EEG, only if symptoms get more frequent   2.  Bilateral hand numbness due to cervical foraminal stenosis C4-5 and C6-7, confirmed on EMG showsing bilateral C5-7 radiculopathy.   No signs of CTS on NCS/EMG.    - Continue physical therapy which has helped alleviate neck stiffness and tingling of the hands.   - Encouraged to be compliant with home exercises.    -  If symptoms get worse, consider MRI cervical spine.  Return to clinic in 6 months   Thank you for allowing me to participate in patient's care.  If I can answer any additional questions, I would be pleased to do so.    Sincerely,    Donika K. Allena KatzPatel, DO

## 2018-12-20 NOTE — Patient Instructions (Signed)
Continue physical therapy  We will refer you for extended cardiac monitoring  If your dizzy spells get worse, call my office for sooner visit.  Otherwise, I will see you back in 6 months  The physicians and staff at [facility] are committed to providing excellent care. You may receive a survey requesting feedback about your experience at our office. We strive to receive "very good" responses to the survey questions. If you feel that your experience would prevent you from giving the office a "very good " response, please contact our office to try to remedy the situation. We may be reached at [phone]. Thank you for taking the time out of your busy day to complete the survey.

## 2018-12-28 ENCOUNTER — Ambulatory Visit (INDEPENDENT_AMBULATORY_CARE_PROVIDER_SITE_OTHER): Payer: BLUE CROSS/BLUE SHIELD

## 2018-12-28 DIAGNOSIS — R42 Dizziness and giddiness: Secondary | ICD-10-CM | POA: Diagnosis not present

## 2018-12-31 DIAGNOSIS — M5136 Other intervertebral disc degeneration, lumbar region: Secondary | ICD-10-CM | POA: Diagnosis not present

## 2018-12-31 DIAGNOSIS — M545 Low back pain: Secondary | ICD-10-CM | POA: Diagnosis not present

## 2019-02-02 ENCOUNTER — Telehealth: Payer: Self-pay | Admitting: Neurology

## 2019-02-02 NOTE — Telephone Encounter (Signed)
Patient is calling in about next steps for heart monitor. She wore it for a month and mailed it back. She called heart doctor and they didn't know what steps to call here. Please call her back at [phone]. Thanks!

## 2019-02-02 NOTE — Telephone Encounter (Signed)
Please inform her that her cardiac rhythm is normal.  If symptoms get worse, we can order ambulatory EEG.

## 2019-02-02 NOTE — Telephone Encounter (Signed)
Please advise 

## 2019-02-02 NOTE — Telephone Encounter (Signed)
Patient given results

## 2019-02-02 NOTE — Telephone Encounter (Signed)
Please contact cardiology and request cardiac monitor results to be uploaded to epic. We will contact patient once we have the results.

## 2019-05-31 ENCOUNTER — Encounter: Payer: Self-pay | Admitting: Neurology

## 2019-06-20 ENCOUNTER — Ambulatory Visit: Payer: BLUE CROSS/BLUE SHIELD | Admitting: Neurology

## 2019-08-06 ENCOUNTER — Other Ambulatory Visit: Payer: Self-pay | Admitting: Physician Assistant

## 2019-08-06 DIAGNOSIS — Z1231 Encounter for screening mammogram for malignant neoplasm of breast: Secondary | ICD-10-CM

## 2019-08-17 ENCOUNTER — Emergency Department (HOSPITAL_COMMUNITY): Payer: BLUE CROSS/BLUE SHIELD

## 2019-08-17 ENCOUNTER — Other Ambulatory Visit: Payer: Self-pay

## 2019-08-17 ENCOUNTER — Emergency Department (HOSPITAL_COMMUNITY)
Admission: EM | Admit: 2019-08-17 | Discharge: 2019-08-17 | Disposition: A | Discharge status: Home / Self Care | Payer: BLUE CROSS/BLUE SHIELD | Attending: Emergency Medicine | Admitting: Emergency Medicine

## 2019-08-17 ENCOUNTER — Encounter (HOSPITAL_COMMUNITY): Payer: Self-pay | Admitting: Emergency Medicine

## 2019-08-17 DIAGNOSIS — R079 Chest pain, unspecified: Secondary | ICD-10-CM | POA: Diagnosis not present

## 2019-08-17 DIAGNOSIS — R0789 Other chest pain: Secondary | ICD-10-CM | POA: Diagnosis present

## 2019-08-17 DIAGNOSIS — Z5321 Procedure and treatment not carried out due to patient leaving prior to being seen by health care provider: Secondary | ICD-10-CM | POA: Insufficient documentation

## 2019-08-17 LAB — BASIC METABOLIC PANEL
Anion gap: 9 (ref 5–15)
BUN: 17 mg/dL (ref 6–20)
CO2: 24 mmol/L (ref 22–32)
Calcium: 9.1 mg/dL (ref 8.9–10.3)
Chloride: 105 mmol/L (ref 98–111)
Creatinine, Ser: 0.68 mg/dL (ref 0.44–1.00)
GFR calc Af Amer: >60 mL/min (ref >60)
GFR calc non Af Amer: >60 mL/min (ref >60)
Glucose, Bld: 90 mg/dL (ref 70–99)
Potassium: 3.7 mmol/L (ref 3.5–5.1)
Sodium: 138 mmol/L (ref 135–145)

## 2019-08-17 LAB — CBC
HCT: 37.9 % (ref 36.0–46.0)
Hemoglobin: 12.7 g/dL (ref 12.0–15.0)
MCH: 29.8 pg (ref 26.0–34.0)
MCHC: 33.5 g/dL (ref 30.0–36.0)
MCV: 89 fL (ref 80.0–100.0)
Platelets: 187 10*3/uL (ref 150–400)
RBC: 4.26 MIL/uL (ref 3.87–5.11)
RDW: 13.2 % (ref 11.5–15.5)
WBC: 5.7 10*3/uL (ref 4.0–10.5)
nRBC: 0 % (ref 0.0–0.2)

## 2019-08-17 LAB — TROPONIN I (HIGH SENSITIVITY): Troponin I (High Sensitivity): 4 ng/L (ref <18)

## 2019-08-17 LAB — I-STAT BETA HCG BLOOD, ED (MC, WL, AP ONLY): I-stat hCG, quantitative: <5 m[IU]/mL (ref <5)

## 2019-08-17 MED ORDER — SODIUM CHLORIDE 0.9% FLUSH: 3.0000 mL | Freq: Once | INTRAVENOUS | Status: DC

## 2019-08-17 NOTE — ED Notes (Signed)
IV removed before leaving.

## 2019-08-17 NOTE — ED Triage Notes (Signed)
Pt arrives via [facility] with c/o of  Central chest pain that radiates into bilateral jaw and into upper back that started suddenly while at work about 1 hr ago.  PT WAS GIVEN 324 ASA AND 1 SL NITRO WHICH DID EASE PAIN CURRENTLY AT 2/10.

## 2019-09-21 ENCOUNTER — Other Ambulatory Visit: Payer: Self-pay

## 2019-09-21 ENCOUNTER — Ambulatory Visit
Admission: RE | Admit: 2019-09-21 | Discharge: 2019-09-21 | Disposition: A | Discharge status: Home / Self Care | Payer: BLUE CROSS/BLUE SHIELD | Source: Ambulatory Visit | Attending: Physician Assistant | Admitting: Physician Assistant

## 2019-09-21 DIAGNOSIS — Z1231 Encounter for screening mammogram for malignant neoplasm of breast: Secondary | ICD-10-CM | POA: Diagnosis not present

## 2019-10-10 ENCOUNTER — Other Ambulatory Visit: Payer: Self-pay

## 2019-10-10 DIAGNOSIS — Z20822 Contact with and (suspected) exposure to covid-19: Secondary | ICD-10-CM

## 2019-10-11 LAB — NOVEL CORONAVIRUS, NAA: SARS-CoV-2, NAA: NOT DETECTED

## 2020-02-05 DIAGNOSIS — Z03818 Encounter for observation for suspected exposure to other biological agents ruled out: Secondary | ICD-10-CM | POA: Diagnosis not present

## 2020-02-05 DIAGNOSIS — Z20828 Contact with and (suspected) exposure to other viral communicable diseases: Secondary | ICD-10-CM | POA: Diagnosis not present

## 2020-04-16 DIAGNOSIS — I10 Essential (primary) hypertension: Secondary | ICD-10-CM | POA: Diagnosis not present

## 2020-04-16 DIAGNOSIS — Z03818 Encounter for observation for suspected exposure to other biological agents ruled out: Secondary | ICD-10-CM | POA: Diagnosis not present

## 2020-04-16 DIAGNOSIS — Z20822 Contact with and (suspected) exposure to covid-19: Secondary | ICD-10-CM | POA: Diagnosis not present

## 2020-04-17 DIAGNOSIS — R05 Cough: Secondary | ICD-10-CM | POA: Diagnosis not present

## 2020-04-17 DIAGNOSIS — J029 Acute pharyngitis, unspecified: Secondary | ICD-10-CM | POA: Diagnosis not present

## 2020-04-17 DIAGNOSIS — I10 Essential (primary) hypertension: Secondary | ICD-10-CM | POA: Diagnosis not present

## 2020-07-30 DIAGNOSIS — Z13220 Encounter for screening for lipoid disorders: Secondary | ICD-10-CM | POA: Diagnosis not present

## 2020-07-30 DIAGNOSIS — K2970 Gastritis, unspecified, without bleeding: Secondary | ICD-10-CM | POA: Diagnosis not present

## 2020-07-30 DIAGNOSIS — F411 Generalized anxiety disorder: Secondary | ICD-10-CM | POA: Diagnosis not present

## 2020-07-30 DIAGNOSIS — Z0000 Encounter for general adult medical examination without abnormal findings: Secondary | ICD-10-CM | POA: Diagnosis not present

## 2020-07-30 DIAGNOSIS — Z8679 Personal history of other diseases of the circulatory system: Secondary | ICD-10-CM | POA: Diagnosis not present

## 2020-07-30 DIAGNOSIS — K219 Gastro-esophageal reflux disease without esophagitis: Secondary | ICD-10-CM | POA: Diagnosis not present

## 2020-08-25 ENCOUNTER — Other Ambulatory Visit: Payer: Self-pay | Admitting: Physician Assistant

## 2020-08-25 DIAGNOSIS — R197 Diarrhea, unspecified: Secondary | ICD-10-CM | POA: Diagnosis not present

## 2020-08-25 DIAGNOSIS — Z800 Family history of malignant neoplasm of digestive organs: Secondary | ICD-10-CM | POA: Diagnosis not present

## 2020-08-25 DIAGNOSIS — K625 Hemorrhage of anus and rectum: Secondary | ICD-10-CM

## 2020-08-25 DIAGNOSIS — R109 Unspecified abdominal pain: Secondary | ICD-10-CM | POA: Diagnosis not present

## 2020-08-26 ENCOUNTER — Other Ambulatory Visit: Payer: Self-pay | Admitting: Physician Assistant

## 2020-08-28 DIAGNOSIS — Z20822 Contact with and (suspected) exposure to covid-19: Secondary | ICD-10-CM | POA: Diagnosis not present

## 2020-08-28 DIAGNOSIS — U071 COVID-19: Secondary | ICD-10-CM | POA: Diagnosis not present

## 2020-09-01 ENCOUNTER — Other Ambulatory Visit: Payer: BLUE CROSS/BLUE SHIELD

## 2020-09-05 DIAGNOSIS — Z1159 Encounter for screening for other viral diseases: Secondary | ICD-10-CM | POA: Diagnosis not present

## 2020-10-03 ENCOUNTER — Emergency Department (HOSPITAL_COMMUNITY): Payer: BC Managed Care – PPO

## 2020-10-03 ENCOUNTER — Emergency Department (HOSPITAL_COMMUNITY)
Admission: EM | Admit: 2020-10-03 | Discharge: 2020-10-03 | Disposition: A | Discharge status: Home / Self Care | Payer: BC Managed Care – PPO | Attending: Emergency Medicine | Admitting: Emergency Medicine

## 2020-10-03 ENCOUNTER — Encounter (HOSPITAL_COMMUNITY): Payer: Self-pay | Admitting: Pharmacy Technician

## 2020-10-03 ENCOUNTER — Other Ambulatory Visit: Payer: Self-pay

## 2020-10-03 DIAGNOSIS — S0990XA Unspecified injury of head, initial encounter: Secondary | ICD-10-CM | POA: Diagnosis not present

## 2020-10-03 DIAGNOSIS — Z79891 Long term (current) use of opiate analgesic: Secondary | ICD-10-CM | POA: Insufficient documentation

## 2020-10-03 DIAGNOSIS — W010XXA Fall on same level from slipping, tripping and stumbling without subsequent striking against object, initial encounter: Secondary | ICD-10-CM | POA: Insufficient documentation

## 2020-10-03 DIAGNOSIS — S0081XA Abrasion of other part of head, initial encounter: Secondary | ICD-10-CM

## 2020-10-03 DIAGNOSIS — R52 Pain, unspecified: Secondary | ICD-10-CM | POA: Diagnosis not present

## 2020-10-03 DIAGNOSIS — Z791 Long term (current) use of non-steroidal anti-inflammatories (NSAID): Secondary | ICD-10-CM | POA: Insufficient documentation

## 2020-10-03 DIAGNOSIS — M79641 Pain in right hand: Secondary | ICD-10-CM | POA: Diagnosis not present

## 2020-10-03 DIAGNOSIS — S6991XA Unspecified injury of right wrist, hand and finger(s), initial encounter: Secondary | ICD-10-CM | POA: Diagnosis not present

## 2020-10-03 DIAGNOSIS — W19XXXA Unspecified fall, initial encounter: Secondary | ICD-10-CM | POA: Diagnosis not present

## 2020-10-03 DIAGNOSIS — I10 Essential (primary) hypertension: Secondary | ICD-10-CM | POA: Insufficient documentation

## 2020-10-03 DIAGNOSIS — S60511A Abrasion of right hand, initial encounter: Secondary | ICD-10-CM | POA: Diagnosis not present

## 2020-10-03 DIAGNOSIS — K625 Hemorrhage of anus and rectum: Secondary | ICD-10-CM

## 2020-10-03 DIAGNOSIS — M25561 Pain in right knee: Secondary | ICD-10-CM | POA: Diagnosis not present

## 2020-10-03 DIAGNOSIS — S00511A Abrasion of lip, initial encounter: Secondary | ICD-10-CM | POA: Diagnosis not present

## 2020-10-03 DIAGNOSIS — Z9049 Acquired absence of other specified parts of digestive tract: Secondary | ICD-10-CM | POA: Insufficient documentation

## 2020-10-03 MED ORDER — HYDROCODONE-ACETAMINOPHEN 5-325 MG PO TABS: 1.0000 | ORAL_TABLET | Freq: Four times a day (QID) | ORAL | 0 refills | Status: AC | PRN

## 2020-10-03 MED ORDER — IBUPROFEN 600 MG PO TABS: 600.0000 mg | ORAL_TABLET | Freq: Four times a day (QID) | ORAL | 0 refills | Status: AC | PRN

## 2020-10-03 MED ORDER — FENTANYL CITRATE (PF) 100 MCG/2ML IJ SOLN
50.0000 ug | Freq: Once | INTRAMUSCULAR | Status: AC
  Administered 2020-10-03: 50 ug via INTRAMUSCULAR
  Filled 2020-10-03: qty 2

## 2020-10-03 NOTE — ED Notes (Signed)
Patient transported to X-ray 

## 2020-10-03 NOTE — ED Triage Notes (Signed)
Pt here via [facility] from home after slipping on leaves outside and falling forward. Pt with lac to lip and abrasions to R hand. Pt denies LOC, no blood thinners. VSS with EMS.

## 2020-10-03 NOTE — ED Provider Notes (Signed)
[facility] EMERGENCY DEPARTMENT Provider Note   CSN: [id number] Arrival date & time: 10/03/20  1017     History Chief Complaint  Patient presents with  . Fall    [name] is a 54 y.o. female.  [name] is a 54 y.o. female with a history of hypertension, GERD, previous [place] fracture who presents via EMS after a fall. Patient reports that she was carrying something to the garbage and slipped on wet leaves falling forward. She tried to catch herself with her right hand and has an abrasion to the 5th finger and pain and swelling of the 5th metacarpal. She struck her face on the ground and has an abrasion just above the upper lip and a very superficial laceration to the upper lip that does not cross the [place] border. She also reports that she thinks she chipped her front tooth. She reports some mild pain to the forehead. No LOC after head injury.  Mild headache, no visual changes, vomiting, dizziness, numbness tingling or weakness.  Patient does report that she is got some pain in her neck and is starting to feel sore from the fall.  She reports that the last time she had a bad fall she ended up in the hospital with a [place] fracture and required surgery.  She is very worried that she could have injured this or affected this repair.  Denies malocclusion of the jaw.  No pain over the chest or abdomen.  Reports some mild right knee pain with small abrasion, no other pain in the lower extremities.  No blood thinners.  Tetanus up-to-date.  No other aggravating or alleviating factors.        Past Medical History:  Diagnosis Date  . Fall at home   . GERD (gastroesophageal reflux disease)   . Hypertension     Patient Active Problem List   Diagnosis Date Noted  . Acute appendicitis 09/29/2016  . Acute blood loss anemia 05/05/2015  . Fall 05/02/2015  . [place] fracture (HCC) 05/02/2015  . Cervical spinal stenosis 05/02/2015  . Syncope 05/02/2015  .  Central cord syndrome (HCC) 05/02/2015  . Hypertension 05/02/2015  . Faintness     Past Surgical History:  Procedure Laterality Date  . CESAREAN SECTION    . CHOLECYSTECTOMY N/A 07/09/2014   Procedure: LAPAROSCOPIC CHOLECYSTECTOMY;  Surgeon: Liz MaladyBurke E Thompson, MD;  [facility];  Service: General;  Laterality: N/A;  . LAPAROSCOPIC APPENDECTOMY N/A 09/29/2016   Procedure: APPENDECTOMY LAPAROSCOPIC;  Surgeon: Jimmye NormanJames Wyatt, MD;  [facility];  Service: General;  Laterality: N/A;  . MANDIBULAR HARDWARE REMOVAL N/A 05/13/2015   Procedure: MANDIBILE HARDWARE REMOVAL;  Surgeon: Suzanna ObeyJohn Byers, MD;  Location: [facility];  Service: ENT;  Laterality: N/A;  . ORIF MANDIBULAR FRACTURE Bilateral 05/03/2015   Procedure: OPEN REDUCTION INTERNAL FIXATION (ORIF) MAXILLA FRACTURE ;  Surgeon: Suzanna ObeyJohn Byers, MD;  [facility];  Service: ENT;  Laterality: Bilateral;     OB History   No obstetric history on file.     Family History  Problem Relation Age of Onset  . Breast cancer Mother [id number]       deceased at age 56  . Lung cancer Mother   . Other Father        Work-related accidental death  . Diabetes Maternal Grandmother   . Dementia Maternal Grandmother   . Dementia Paternal Grandmother     Social History   Tobacco Use  .  Smoking status: Never Smoker  . Smokeless tobacco: Never Used  Vaping Use  . Vaping Use: Never used  Substance Use Topics  . Alcohol use: No  . Drug use: No    Home Medications Prior to Admission medications   Medication Sig Start Date End Date Taking? Authorizing Provider  acetaminophen (TYLENOL) 325 MG tablet Take 2 tablets (650 mg total) by mouth every 6 (six) hours as needed for mild pain, fever or headache. 10/01/16  Yes Sherrie George, PA-C  omeprazole (PRILOSEC) 20 MG capsule Take 20 mg by mouth 2 (two) times daily. 07/31/20  Yes [provider]  HYDROcodone-acetaminophen (NORCO) 5-325 MG tablet Take 1 tablet by mouth every 6 (six) hours  as needed. 10/03/20   Dartha Lodge, PA-C  ibuprofen (ADVIL) 600 MG tablet Take 1 tablet (600 mg total) by mouth every 6 (six) hours as needed. 10/03/20   Dartha Lodge, PA-C    Allergies    Codeine and Adhesive [tape]  Review of Systems   Review of Systems  Constitutional: Negative for chills and fever.  HENT: Positive for facial swelling. Negative for congestion, rhinorrhea and sore throat.   Respiratory: Negative for cough and shortness of breath.   Cardiovascular: Negative for chest pain.  Gastrointestinal: Negative for abdominal pain, nausea and vomiting.  Genitourinary: Negative for dysuria.  Musculoskeletal: Positive for arthralgias, myalgias and neck pain. Negative for back pain and joint swelling.  Skin: Positive for wound. Negative for color change.    Physical Exam Updated Vital Signs Pulse 66   Temp 98 F (36.7 C)   Resp 18   Ht  (1.651 m)   Wt 77.1 kg   LMP 06/16/2014   SpO2 100%   BMI 28.29 kg/m   Physical Exam Vitals and nursing note reviewed.  Constitutional:      General: She is not in acute distress.    Appearance: Normal appearance. She is well-developed. She is not diaphoretic.     Comments: Well-appearing and in no distress  HENT:     Head: Normocephalic.     Comments: Patient with abrasion to the forehead with small hematoma, no other hematomas, step-off or deformity over the scalp, negative battle sign    Right Ear: Tympanic membrane and ear canal normal.     Left Ear: Tympanic membrane and ear canal normal.     Ears:     Comments: No hemotympanum    Nose: Nose normal.     Comments: No nasal deformity, no epistaxis    Mouth/Throat:     Comments: Abrasion just above the upper lip and a very superficial laceration to the upper lip that will not require suture repair.  Mild swelling to the upper lip, right front tooth with small chip but tooth is not loose and remains intact, no lacerations to the inner upper lip. Pt has some jaw tenderness,  but no malocclusion  Eyes:     General:        Right eye: No discharge.        Left eye: No discharge.     Extraocular Movements: Extraocular movements intact.     Pupils: Pupils are equal, round, and reactive to light.     Comments: No periorbital pain or swelling  Cardiovascular:     Rate and Rhythm: Normal rate and regular rhythm.     Heart sounds: Normal heart sounds. No murmur heard.  No friction rub. No gallop.   Pulmonary:     Effort: Pulmonary  effort is normal. No respiratory distress.     Breath sounds: Normal breath sounds. No wheezing or rales.     Comments: Chest wall nontender, breath sounds present and equal bilaterally Abdominal:     General: Bowel sounds are normal. There is no distension.     Palpations: Abdomen is soft. There is no mass.     Tenderness: There is no abdominal tenderness. There is no guarding.     Comments: Abdomen NTTP in al quandrants  Musculoskeletal:        General: Swelling and tenderness present.     Cervical back: Neck supple.     Comments: Tenderness and swelling noted over the right fifth metacarpal that extends into the right fifth finger, there is a superficial abrasion to the lateral portion of the finger, but no larger laceration that require suture repair.  Normal sensation and flexion and extension of the finger. Mild tenderness to the right knee with small abrasion noted, able to flex and extend with mild discomfort.  Distal pulses intact.  Skin:    General: Skin is warm and dry.     Capillary Refill: Capillary refill takes less than 2 seconds.  Neurological:     Mental Status: She is alert.     Coordination: Coordination normal.     Comments: Speech is clear, able to follow commands CN III-XII intact Normal strength in upper and lower extremities bilaterally including dorsiflexion and plantar flexion, strong and equal grip strength Sensation normal to light and sharp touch Moves extremities without ataxia, coordination intact    Psychiatric:        Mood and Affect: Mood normal.        Behavior: Behavior normal.     ED Results / Procedures / Treatments   Labs (all labs ordered are listed, but only abnormal results are displayed) Labs Reviewed - No data to display  EKG None  Radiology CT Head Wo Contrast  Result Date: 10/03/2020 CLINICAL DATA:  Trauma. EXAM: CT HEAD WITHOUT CONTRAST CT MAXILLOFACIAL WITHOUT CONTRAST CT CERVICAL SPINE WITHOUT CONTRAST TECHNIQUE: Multidetector CT imaging of the head, cervical spine, and maxillofacial structures were performed using the standard protocol without intravenous contrast. Multiplanar CT image reconstructions of the cervical spine and maxillofacial structures were also generated. COMPARISON:  None. FINDINGS: CT HEAD FINDINGS Brain: No evidence of acute infarction, hemorrhage, hydrocephalus, extra-axial collection or mass lesion/mass effect. Vascular: No hyperdense vessel or unexpected calcification. Calcific atherosclerosis. Skull: Normal. Negative for fracture or focal lesion. Other: No mastoid effusions. CT MAXILLOFACIAL FINDINGS Osseous: No fracture or mandibular dislocation. No destructive process. Orbits: Negative. No traumatic or inflammatory finding. Sinuses: Mild right sphenoid sinus disease with frothy secretions. Otherwise, clear. Leftward nasal septal deviation with bony spur. Soft tissues: Negative. CT CERVICAL SPINE FINDINGS Alignment: Straightening of the normal cervical lordosis, likely positional. No substantial subluxation. Skull base and vertebrae: No acute fracture. No primary bone lesion or focal pathologic process. Soft tissues and spinal canal: No prevertebral fluid or swelling. No visible canal hematoma. Disc levels: There is mild ossification of the posterior longitudinal ligament at C3-C4 and C4-C5. No evidence of advanced bony canal stenosis. Mild multilevel degenerative disc disease. Upper chest: Negative. IMPRESSION: 1. No evidence of acute intracranial  abnormality. 2. No acute facial fracture. 3. No evidence of acute fracture or traumatic malalignment the cervical spine. Electronically Signed   By: Feliberto Harts MD   On: 10/03/2020 13:40   CT Cervical Spine Wo Contrast  Result Date: 10/03/2020 CLINICAL DATA:  Trauma. EXAM: CT HEAD WITHOUT CONTRAST CT MAXILLOFACIAL WITHOUT CONTRAST CT CERVICAL SPINE WITHOUT CONTRAST TECHNIQUE: Multidetector CT imaging of the head, cervical spine, and maxillofacial structures were performed using the standard protocol without intravenous contrast. Multiplanar CT image reconstructions of the cervical spine and maxillofacial structures were also generated. COMPARISON:  None. FINDINGS: CT HEAD FINDINGS Brain: No evidence of acute infarction, hemorrhage, hydrocephalus, extra-axial collection or mass lesion/mass effect. Vascular: No hyperdense vessel or unexpected calcification. Calcific atherosclerosis. Skull: Normal. Negative for fracture or focal lesion. Other: No mastoid effusions. CT MAXILLOFACIAL FINDINGS Osseous: No fracture or mandibular dislocation. No destructive process. Orbits: Negative. No traumatic or inflammatory finding. Sinuses: Mild right sphenoid sinus disease with frothy secretions. Otherwise, clear. Leftward nasal septal deviation with bony spur. Soft tissues: Negative. CT CERVICAL SPINE FINDINGS Alignment: Straightening of the normal cervical lordosis, likely positional. No substantial subluxation. Skull base and vertebrae: No acute fracture. No primary bone lesion or focal pathologic process. Soft tissues and spinal canal: No prevertebral fluid or swelling. No visible canal hematoma. Disc levels: There is mild ossification of the posterior longitudinal ligament at C3-C4 and C4-C5. No evidence of advanced bony canal stenosis. Mild multilevel degenerative disc disease. Upper chest: Negative. IMPRESSION: 1. No evidence of acute intracranial abnormality. 2. No acute facial fracture. 3. No evidence of acute  fracture or traumatic malalignment the cervical spine. Electronically Signed   By: Feliberto Harts MD   On: 10/03/2020 13:40   DG Knee Complete 4 Views Right  Result Date: 10/03/2020 CLINICAL DATA:  Fall.  Right knee pain. EXAM: RIGHT KNEE - COMPLETE 4+ VIEW COMPARISON:  None. FINDINGS: No evidence of fracture, dislocation, or joint effusion. No evidence of arthropathy or other focal bone abnormality. Soft tissues are unremarkable. IMPRESSION: Negative right knee radiographs. Electronically Signed   By: Marin Roberts M.D.   On: 10/03/2020 12:22   DG Hand Complete Right  Result Date: 10/03/2020 CLINICAL DATA:  Right hand pain after fall EXAM: RIGHT HAND - COMPLETE 3+ VIEW COMPARISON:  None. FINDINGS: No acute fracture. No malalignment. There is shortening of the fifth greater than fourth metacarpals. No focal soft tissue swelling or radiopaque foreign body. IMPRESSION: 1. No acute fracture or malalignment. 2. Shortening of the fifth greater than fourth metacarpals. Findings are nonspecific and can be related to remote trauma versus congenital and metabolic processes. Electronically Signed   By: Duanne Guess D.O.   On: 10/03/2020 12:14   CT Maxillofacial Wo Contrast  Result Date: 10/03/2020 CLINICAL DATA:  Trauma. EXAM: CT HEAD WITHOUT CONTRAST CT MAXILLOFACIAL WITHOUT CONTRAST CT CERVICAL SPINE WITHOUT CONTRAST TECHNIQUE: Multidetector CT imaging of the head, cervical spine, and maxillofacial structures were performed using the standard protocol without intravenous contrast. Multiplanar CT image reconstructions of the cervical spine and maxillofacial structures were also generated. COMPARISON:  None. FINDINGS: CT HEAD FINDINGS Brain: No evidence of acute infarction, hemorrhage, hydrocephalus, extra-axial collection or mass lesion/mass effect. Vascular: No hyperdense vessel or unexpected calcification. Calcific atherosclerosis. Skull: Normal. Negative for fracture or focal lesion. Other: No  mastoid effusions. CT MAXILLOFACIAL FINDINGS Osseous: No fracture or mandibular dislocation. No destructive process. Orbits: Negative. No traumatic or inflammatory finding. Sinuses: Mild right sphenoid sinus disease with frothy secretions. Otherwise, clear. Leftward nasal septal deviation with bony spur. Soft tissues: Negative. CT CERVICAL SPINE FINDINGS Alignment: Straightening of the normal cervical lordosis, likely positional. No substantial subluxation. Skull base and vertebrae: No acute fracture. No primary bone lesion or focal pathologic process. Soft tissues and spinal canal: No prevertebral  fluid or swelling. No visible canal hematoma. Disc levels: There is mild ossification of the posterior longitudinal ligament at C3-C4 and C4-C5. No evidence of advanced bony canal stenosis. Mild multilevel degenerative disc disease. Upper chest: Negative. IMPRESSION: 1. No evidence of acute intracranial abnormality. 2. No acute facial fracture. 3. No evidence of acute fracture or traumatic malalignment the cervical spine. Electronically Signed   By: Feliberto Harts MD   On: 10/03/2020 13:40    Procedures Procedures (including critical care time)  Medications Ordered in ED Medications  fentaNYL (SUBLIMAZE) injection 50 mcg (50 mcg Intramuscular Given 10/03/20 1202)    ED Course  I have reviewed the triage vital signs and the nursing notes.  Pertinent labs & imaging results that were available during my care of the patient were reviewed by me and considered in my medical decision making (see chart for details).    MDM Rules/Calculators/A&P                          54 year old female presents after mechanical fall in her driveway, falling forward and injuring her face as well as her right hand and right knee.  No LOC and no focal neurologic deficits, history of previous [facility] fracture and surgical repair.  Abrasions to the face and upper lip as well as to the right hand but no larger lacerations  requiring repair.  Tetanus up-to-date.  Will get CTs of the head, cervical spine and maxillofacial bones and x-rays of the right hand and knee.  Lacerations cleaned.  Imaging is fortunately very reassuring, CTs of the head, neck and face with no evidence of acute fracture, intracranial injury or C-spine injury.  X-rays of the right hand show shortening of the fifth metacarpal which is likely congenital but given the patient is having severe pain and swelling in this area concern for possible occult fracture.  Discussed with PA Earney Hamburg of hand surgery who recommends splinting and having patient follow-up with Dr. Merlyn Lot.  Knee x-ray is unremarkable.  Discussed reassuring work-up with patient and her son.  Patient placed in ulnar gutter splint and reports significant improvement in the pain in her hand.  Will provide short course of pain medication and discussed appropriate follow-up.  Return precautions discussed.  Patient discharged home in good condition.  Final Clinical Impression(s) / ED Diagnoses Final diagnoses:  Fall  Abrasion of face, initial encounter  Injury of head, initial encounter  Injury of right hand, initial encounter    Rx / DC Orders ED Discharge Orders         Ordered    ibuprofen (ADVIL) 600 MG tablet  Every 6 hours PRN        10/03/20 1530    HYDROcodone-acetaminophen (NORCO) 5-325 MG tablet  Every 6 hours PRN        10/03/20 1530           Dartha Lodge, Cordelia Poche 10/04/20 2032    Terald Sleeper, MD 10/05/20 1155

## 2020-10-03 NOTE — Progress Notes (Signed)
Orthopedic Tech Progress Note Patient Details:  Heather Hamilton 1966-08-23 [id number]  Ortho Devices Type of Ortho Device: Ulna gutter splint Ortho Device/Splint Location: RUE Ortho Device/Splint Interventions: Ordered, Application   Post Interventions Patient Tolerated: Well Instructions Provided: Care of device   Donald Pore 10/03/2020, 4:45 PM

## 2020-10-03 NOTE — Discharge Instructions (Addendum)
Your imaging today has been very reassuring.  While your hand x-rays did not show an obvious fracture, the bone did look slightly abnormal, please remain in splint and follow-up with Dr. Merlyn Lot with hand surgery for reevaluation.  The abrasions to your finger as well as to your upper lip should heal well on their own.  You can use Vaseline or Aquaphor over the lip and apply ice to help with swelling.  Use ibuprofen 600 mg as needed for pain, Norco as needed for breakthrough pain.  Apply ice and elevate the hand.

## 2020-10-08 DIAGNOSIS — S61216D Laceration without foreign body of right little finger without damage to nail, subsequent encounter: Secondary | ICD-10-CM | POA: Diagnosis not present

## 2020-10-08 DIAGNOSIS — S61216A Laceration without foreign body of right little finger without damage to nail, initial encounter: Secondary | ICD-10-CM | POA: Diagnosis not present

## 2020-10-08 DIAGNOSIS — S60221D Contusion of right hand, subsequent encounter: Secondary | ICD-10-CM | POA: Diagnosis not present

## 2020-10-08 DIAGNOSIS — S60221A Contusion of right hand, initial encounter: Secondary | ICD-10-CM | POA: Diagnosis not present

## 2020-10-08 DIAGNOSIS — M79641 Pain in right hand: Secondary | ICD-10-CM | POA: Diagnosis not present

## 2020-10-15 DIAGNOSIS — Z1159 Encounter for screening for other viral diseases: Secondary | ICD-10-CM | POA: Diagnosis not present

## 2020-10-20 DIAGNOSIS — R109 Unspecified abdominal pain: Secondary | ICD-10-CM | POA: Diagnosis not present

## 2020-10-20 DIAGNOSIS — K219 Gastro-esophageal reflux disease without esophagitis: Secondary | ICD-10-CM | POA: Diagnosis not present

## 2020-10-20 DIAGNOSIS — K648 Other hemorrhoids: Secondary | ICD-10-CM | POA: Diagnosis not present

## 2020-10-20 DIAGNOSIS — D125 Benign neoplasm of sigmoid colon: Secondary | ICD-10-CM | POA: Diagnosis not present

## 2020-10-20 DIAGNOSIS — R12 Heartburn: Secondary | ICD-10-CM | POA: Diagnosis not present

## 2020-10-20 DIAGNOSIS — K2980 Duodenitis without bleeding: Secondary | ICD-10-CM | POA: Diagnosis not present

## 2020-10-20 DIAGNOSIS — K625 Hemorrhage of anus and rectum: Secondary | ICD-10-CM | POA: Diagnosis not present

## 2020-10-20 DIAGNOSIS — K2930 Chronic superficial gastritis without bleeding: Secondary | ICD-10-CM | POA: Diagnosis not present

## 2020-10-20 DIAGNOSIS — Z800 Family history of malignant neoplasm of digestive organs: Secondary | ICD-10-CM | POA: Diagnosis not present

## 2020-10-22 DIAGNOSIS — S60221A Contusion of right hand, initial encounter: Secondary | ICD-10-CM | POA: Diagnosis not present

## 2020-10-22 DIAGNOSIS — S63656D Sprain of metacarpophalangeal joint of right little finger, subsequent encounter: Secondary | ICD-10-CM | POA: Diagnosis not present

## 2020-10-22 DIAGNOSIS — S61216A Laceration without foreign body of right little finger without damage to nail, initial encounter: Secondary | ICD-10-CM | POA: Diagnosis not present

## 2020-10-23 ENCOUNTER — Other Ambulatory Visit: Payer: Self-pay | Admitting: Physician Assistant

## 2020-10-23 DIAGNOSIS — Z1231 Encounter for screening mammogram for malignant neoplasm of breast: Secondary | ICD-10-CM

## 2020-10-27 DIAGNOSIS — K2970 Gastritis, unspecified, without bleeding: Secondary | ICD-10-CM | POA: Diagnosis not present

## 2020-10-27 DIAGNOSIS — Z86010 Personal history of colonic polyps: Secondary | ICD-10-CM | POA: Diagnosis not present

## 2020-10-27 DIAGNOSIS — K2100 Gastro-esophageal reflux disease with esophagitis, without bleeding: Secondary | ICD-10-CM | POA: Diagnosis not present

## 2020-10-27 DIAGNOSIS — Z800 Family history of malignant neoplasm of digestive organs: Secondary | ICD-10-CM | POA: Diagnosis not present

## 2020-12-04 ENCOUNTER — Ambulatory Visit
Admission: RE | Admit: 2020-12-04 | Discharge: 2020-12-04 | Disposition: A | Discharge status: Home / Self Care | Payer: BLUE CROSS/BLUE SHIELD | Source: Ambulatory Visit | Attending: Physician Assistant | Admitting: Physician Assistant

## 2020-12-04 ENCOUNTER — Other Ambulatory Visit: Payer: Self-pay

## 2020-12-04 DIAGNOSIS — Z1231 Encounter for screening mammogram for malignant neoplasm of breast: Secondary | ICD-10-CM | POA: Diagnosis not present

## 2021-01-25 DIAGNOSIS — J018 Other acute sinusitis: Secondary | ICD-10-CM | POA: Diagnosis not present

## 2021-10-18 DIAGNOSIS — J069 Acute upper respiratory infection, unspecified: Secondary | ICD-10-CM | POA: Diagnosis not present

## 2021-11-04 ENCOUNTER — Other Ambulatory Visit: Payer: Self-pay | Admitting: Physician Assistant

## 2021-11-04 DIAGNOSIS — Z1231 Encounter for screening mammogram for malignant neoplasm of breast: Secondary | ICD-10-CM

## 2021-12-07 ENCOUNTER — Other Ambulatory Visit: Payer: Self-pay

## 2021-12-07 ENCOUNTER — Ambulatory Visit
Admission: RE | Admit: 2021-12-07 | Discharge: 2021-12-07 | Disposition: A | Discharge status: Home / Self Care | Payer: 59 | Source: Ambulatory Visit | Attending: Physician Assistant | Admitting: Physician Assistant

## 2021-12-07 DIAGNOSIS — Z1231 Encounter for screening mammogram for malignant neoplasm of breast: Secondary | ICD-10-CM

## 2021-12-10 ENCOUNTER — Other Ambulatory Visit: Payer: Self-pay | Admitting: Physician Assistant

## 2021-12-10 DIAGNOSIS — M7989 Other specified soft tissue disorders: Secondary | ICD-10-CM

## 2021-12-10 DIAGNOSIS — M79621 Pain in right upper arm: Secondary | ICD-10-CM

## 2021-12-10 DIAGNOSIS — N644 Mastodynia: Secondary | ICD-10-CM

## 2021-12-18 ENCOUNTER — Ambulatory Visit
Admission: RE | Admit: 2021-12-18 | Discharge: 2021-12-18 | Disposition: A | Discharge status: Home / Self Care | Payer: 59 | Source: Ambulatory Visit | Attending: Physician Assistant | Admitting: Physician Assistant

## 2021-12-18 DIAGNOSIS — M7989 Other specified soft tissue disorders: Secondary | ICD-10-CM

## 2021-12-18 DIAGNOSIS — M79621 Pain in right upper arm: Secondary | ICD-10-CM

## 2021-12-18 DIAGNOSIS — N644 Mastodynia: Secondary | ICD-10-CM

## 2021-12-25 ENCOUNTER — Other Ambulatory Visit: Payer: 59

## 2022-01-01 ENCOUNTER — Other Ambulatory Visit: Payer: 59

## 2022-06-28 ENCOUNTER — Ambulatory Visit
Admission: RE | Admit: 2022-06-28 | Discharge: 2022-06-28 | Disposition: A | Discharge status: Home / Self Care | Payer: 59 | Source: Ambulatory Visit | Attending: Physician Assistant | Admitting: Physician Assistant

## 2022-06-28 ENCOUNTER — Other Ambulatory Visit: Payer: Self-pay | Admitting: Physician Assistant

## 2022-06-28 DIAGNOSIS — S99912A Unspecified injury of left ankle, initial encounter: Secondary | ICD-10-CM

## 2022-07-12 ENCOUNTER — Other Ambulatory Visit: Payer: Self-pay | Admitting: Physician Assistant

## 2022-07-12 ENCOUNTER — Ambulatory Visit
Admission: RE | Admit: 2022-07-12 | Discharge: 2022-07-12 | Disposition: A | Discharge status: Home / Self Care | Payer: 59 | Source: Ambulatory Visit | Attending: Physician Assistant | Admitting: Physician Assistant

## 2022-07-12 DIAGNOSIS — M25551 Pain in right hip: Secondary | ICD-10-CM

## 2022-12-21 ENCOUNTER — Emergency Department (HOSPITAL_COMMUNITY)
Admission: EM | Admit: 2022-12-21 | Discharge: 2022-12-21 | Disposition: A | Discharge status: Home / Self Care | Payer: 59 | Attending: Emergency Medicine | Admitting: Emergency Medicine

## 2022-12-21 ENCOUNTER — Encounter (HOSPITAL_COMMUNITY): Payer: Self-pay | Admitting: Emergency Medicine

## 2022-12-21 ENCOUNTER — Other Ambulatory Visit: Payer: Self-pay

## 2022-12-21 DIAGNOSIS — H538 Other visual disturbances: Secondary | ICD-10-CM

## 2022-12-21 NOTE — ED Provider Notes (Incomplete)
  [facility] Provider Note   CSN: [id number] Arrival date & time: 12/21/22  2040     History {Add pertinent medical, surgical, social history, OB history to HPI:1} Chief Complaint  Patient presents with   Eye Problem    Heather Hamilton is a 57 y.o. female.   Eye Problem      Home Medications Prior to Admission medications   Medication Sig Start Date End Date Taking? Authorizing Provider  acetaminophen (TYLENOL) 325 MG tablet Take 2 tablets (650 mg total) by mouth every 6 (six) hours as needed for mild pain, fever or headache. 10/01/16   Earnstine Regal, PA-C  HYDROcodone-acetaminophen (NORCO) 5-325 MG tablet Take 1 tablet by mouth every 6 (six) hours as needed. 10/03/20   Jacqlyn Larsen, PA-C  ibuprofen (ADVIL) 600 MG tablet Take 1 tablet (600 mg total) by mouth every 6 (six) hours as needed. 10/03/20   Jacqlyn Larsen, PA-C  omeprazole (PRILOSEC) 20 MG capsule Take 20 mg by mouth 2 (two) times daily. 07/31/20   [provider]      Allergies    Codeine and Adhesive [tape]    Review of Systems   Review of Systems  Physical Exam Updated Vital Signs BP (!) 176/87   Pulse 82   Temp 98.5 F (36.9 C)   Resp 18   LMP 06/16/2014   SpO2 100%  Physical Exam  ED Results / Procedures / Treatments   Labs (all labs ordered are listed, but only abnormal results are displayed) Labs Reviewed - No data to display  EKG None  Radiology No results found.  Procedures Procedures  {Document cardiac monitor, telemetry assessment procedure when appropriate:1}  Medications Ordered in ED Medications - No data to display  ED Course/ Medical Decision Making/ A&P   {   Click here for ABCD2, HEART and other calculatorsREFRESH Note before signing :1}                          Medical Decision Making  ***  {Document critical care time when appropriate:1} {Document review of labs and clinical decision tools ie heart score,  Chads2Vasc2 etc:1}  {Document your independent review of radiology images, and any outside records:1} {Document your discussion with family members, caretakers, and with consultants:1} {Document social determinants of health affecting pt's care:1} {Document your decision making why or why not admission, treatments were needed:1} Final Clinical Impression(s) / ED Diagnoses Final diagnoses:  None    Rx / DC Orders ED Discharge Orders     None

## 2022-12-21 NOTE — ED Triage Notes (Signed)
Pt states that while at work today thought she could see a hair in left eye. Pt denies any pain or discomfort but states that looks like lines of hair in eye. Pt states if only look out of left eye vision is fuzzy and distorted. Vision is better when looking through both eyes. Pt does not wear contacts or glasses and has not had any injury.

## 2022-12-21 NOTE — Discharge Instructions (Signed)
Please go to Dr. Talbert Forest office at [place]. [phone] at [place] now.
